# Patient Record
Sex: Male | Born: 1940 | Race: White | Hispanic: No | Marital: Married | Smoking: Former smoker
Health system: Southern US, Community
[De-identification: ages and names within clinical notes are randomized; demographics above are authoritative.]

## PROBLEM LIST (undated history)

## (undated) DIAGNOSIS — E785 Hyperlipidemia, unspecified: Secondary | ICD-10-CM

## (undated) HISTORY — DX: Hyperlipidemia, unspecified: E78.5

## (undated) HISTORY — PX: CHOLECYSTECTOMY: SHX55

---

## 2015-09-27 DIAGNOSIS — R030 Elevated blood-pressure reading, without diagnosis of hypertension: Secondary | ICD-10-CM | POA: Diagnosis not present

## 2015-09-27 DIAGNOSIS — R7303 Prediabetes: Secondary | ICD-10-CM | POA: Diagnosis not present

## 2015-09-27 DIAGNOSIS — E559 Vitamin D deficiency, unspecified: Secondary | ICD-10-CM | POA: Diagnosis not present

## 2015-09-27 DIAGNOSIS — R739 Hyperglycemia, unspecified: Secondary | ICD-10-CM | POA: Diagnosis not present

## 2015-09-27 DIAGNOSIS — E785 Hyperlipidemia, unspecified: Secondary | ICD-10-CM | POA: Diagnosis not present

## 2015-09-27 DIAGNOSIS — N400 Enlarged prostate without lower urinary tract symptoms: Secondary | ICD-10-CM | POA: Diagnosis not present

## 2015-09-27 DIAGNOSIS — M1991 Primary osteoarthritis, unspecified site: Secondary | ICD-10-CM | POA: Diagnosis not present

## 2015-12-26 DIAGNOSIS — E785 Hyperlipidemia, unspecified: Secondary | ICD-10-CM | POA: Diagnosis not present

## 2015-12-26 DIAGNOSIS — Z0000 Encounter for general adult medical examination without abnormal findings: Secondary | ICD-10-CM | POA: Diagnosis not present

## 2015-12-26 DIAGNOSIS — M1991 Primary osteoarthritis, unspecified site: Secondary | ICD-10-CM | POA: Diagnosis not present

## 2015-12-26 DIAGNOSIS — N400 Enlarged prostate without lower urinary tract symptoms: Secondary | ICD-10-CM | POA: Diagnosis not present

## 2015-12-26 DIAGNOSIS — E559 Vitamin D deficiency, unspecified: Secondary | ICD-10-CM | POA: Diagnosis not present

## 2015-12-26 DIAGNOSIS — R7303 Prediabetes: Secondary | ICD-10-CM | POA: Diagnosis not present

## 2016-06-29 ENCOUNTER — Encounter: Payer: Self-pay | Admitting: Internal Medicine

## 2016-06-29 DIAGNOSIS — M1991 Primary osteoarthritis, unspecified site: Secondary | ICD-10-CM | POA: Insufficient documentation

## 2016-06-29 DIAGNOSIS — E785 Hyperlipidemia, unspecified: Secondary | ICD-10-CM | POA: Insufficient documentation

## 2016-06-29 DIAGNOSIS — E559 Vitamin D deficiency, unspecified: Secondary | ICD-10-CM | POA: Insufficient documentation

## 2016-06-29 DIAGNOSIS — N400 Enlarged prostate without lower urinary tract symptoms: Secondary | ICD-10-CM | POA: Insufficient documentation

## 2016-06-29 DIAGNOSIS — R7303 Prediabetes: Secondary | ICD-10-CM | POA: Insufficient documentation

## 2016-07-02 ENCOUNTER — Telehealth: Payer: Self-pay | Admitting: Internal Medicine

## 2016-07-02 NOTE — Telephone Encounter (Signed)
Rec'd from [place] forward 68 pages to Dr.Burns

## 2016-07-03 ENCOUNTER — Ambulatory Visit (INDEPENDENT_AMBULATORY_CARE_PROVIDER_SITE_OTHER): Payer: Medicare Other | Admitting: Internal Medicine

## 2016-07-03 ENCOUNTER — Encounter: Payer: Self-pay | Admitting: Internal Medicine

## 2016-07-03 VITALS — BP 134/84 | HR 79 | Temp 98.0°F | Resp 16 | Ht 69.0 in | Wt 198.0 lb

## 2016-07-03 DIAGNOSIS — Z974 Presence of external hearing-aid: Secondary | ICD-10-CM

## 2016-07-03 DIAGNOSIS — E7800 Pure hypercholesterolemia, unspecified: Secondary | ICD-10-CM

## 2016-07-03 DIAGNOSIS — M1991 Primary osteoarthritis, unspecified site: Secondary | ICD-10-CM

## 2016-07-03 DIAGNOSIS — Z23 Encounter for immunization: Secondary | ICD-10-CM

## 2016-07-03 DIAGNOSIS — E785 Hyperlipidemia, unspecified: Secondary | ICD-10-CM | POA: Diagnosis not present

## 2016-07-03 DIAGNOSIS — Z87442 Personal history of urinary calculi: Secondary | ICD-10-CM

## 2016-07-03 DIAGNOSIS — N400 Enlarged prostate without lower urinary tract symptoms: Secondary | ICD-10-CM | POA: Diagnosis not present

## 2016-07-03 DIAGNOSIS — E559 Vitamin D deficiency, unspecified: Secondary | ICD-10-CM

## 2016-07-03 MED ORDER — TAMSULOSIN HCL 0.4 MG PO CAPS: 0.4000 mg | ORAL_CAPSULE | Freq: Every day | ORAL | 3 refills | Status: DC

## 2016-07-03 MED ORDER — FINASTERIDE 5 MG PO TABS: 5.0000 mg | ORAL_TABLET | Freq: Every day | ORAL | 3 refills | Status: DC

## 2016-07-03 NOTE — Progress Notes (Signed)
Patient received education resource, including the self-management goal and tool. Patient verbalized understanding. 

## 2016-07-03 NOTE — Progress Notes (Signed)
;   Subjective:    Patient ID: Joe Blalock, MD, male    DOB: Feb 04, 1941, 75 y.o.   MRN: [id number]  HPI He is here to establish with a new pcp.      Hyperlipidemia: He is trying to control his cholesterol with lifestyle.  He did not tolerate simvatsatin due to severe muscle aches in his legs.  He is compliant with a low fat/cholesterol diet. He is very active, but does not exercise regularly. He denies myalgias.   Osteoarthritis: He has some arthritis - mild in nature.  He takes ibuprofen as needed, which works well for him.  He is very active around his home, but does not do formal exercise.   BPH:  He is taking flomax and proscar daily.  He denies difficulty urinating or blood in the stool.  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    Vitamin D def:  He is taking vitamin D daily.    Prior records show he was a prediabetic.  His a1c has been 5.7%.  He denies being told he had prediabetes and does not feel he does.    Medications and allergies reviewed with patient and updated if appropriate.  Patient Active Problem List   Diagnosis Date Noted  . Hyperlipidemia 06/29/2016  . Prediabetes 06/29/2016  . 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 Vitamin D deficiency 06/29/2016  . BPH (benign prostatic hyperplasia) 06/29/2016    No current outpatient prescriptions on file prior to visit.   No current facility-administered medications on file prior to visit.     History reviewed. No pertinent past medical history.  Past Surgical History:  Procedure Laterality Date  . CHOLECYSTECTOMY      Social History   Social History  . Marital status: Married    Spouse name: N/A  . Number of children: N/A  . Years of education: N/A   Social History Main Topics  . Smoking status: Former Research scientist (life sciences)  . Smokeless tobacco: Never Used  . Alcohol use No  . Drug use: No  . Sexual activity: Not Asked   Other Topics Concern  . None   Social History Narrative  . None    Family History    Problem Relation Age of Onset  . Arthritis Mother   . Heart disease Father   . Arthritis Father   . Diabetes Maternal Aunt   . Cancer Paternal Uncle     lung  . Stroke Maternal Grandmother   . Diabetes Maternal Grandmother     Review of Systems  Constitutional: Negative for chills and unexpected weight change.  Respiratory: Negative for cough, shortness of breath and wheezing.   Cardiovascular: Negative for chest pain and leg swelling.  Genitourinary: Negative for dysuria and hematuria.  Musculoskeletal: Positive for arthralgias.  Neurological: Negative for light-headedness and headaches.       Objective:   Vitals:   07/03/16 0953  BP: 134/84  Pulse: 79  Resp: 16  Temp: 98 F (36.7 C)   Filed Weights   07/03/16 0953  Weight: 198 lb (89.8 kg)   Body mass index is 29.24 kg/m.   Physical Exam Constitutional: He appears well-developed and well-nourished. No distress.  HENT:  Head: Normocephalic and atraumatic.  Right Ear: External ear normal.  Left Ear: External ear normal.  Mouth/Throat: Oropharynx is clear and moist.  Normal ear canals and TM b/l  Eyes: Conjunctivae  normal.  Neck: Neck supple. No tracheal deviation present. No thyromegaly present.  No carotid bruit  Cardiovascular: Normal rate, regular rhythm, normal heart sounds and intact distal pulses.   No murmur heard. Pulmonary/Chest: Effort normal and breath sounds normal. No respiratory distress. He has no wheezes. He has no rales.  Abdominal: Soft. Bowel sounds are normal. He exhibits no distension. There is no tenderness.  Musculoskeletal, vascular: He exhibits no edema.  Lymphadenopathy:   He has no cervical adenopathy.  Skin: Skin is warm and dry. He is not diaphoretic.  Psychiatric: He has a normal mood and affect. His behavior is normal.         Assessment & Plan:   See Problem List for Assessment and Plan of chronic medical problems.

## 2016-07-03 NOTE — Patient Instructions (Addendum)
    All other Health Maintenance issues reviewed.   All recommended immunizations and age-appropriate screenings are up-to-date or discussed.  Pneumonia immunization administered today.   Medications reviewed and updated.  No changes recommended at this time.  Your prescription(s) have been submitted to your pharmacy. Please take as directed and contact our office if you believe you are having problem(s) with the medication(s).   Please followup in 6 months for a physical

## 2016-07-04 ENCOUNTER — Encounter: Payer: Self-pay | Admitting: Internal Medicine

## 2016-07-04 DIAGNOSIS — Z974 Presence of external hearing-aid: Secondary | ICD-10-CM | POA: Insufficient documentation

## 2016-07-04 MED ORDER — IBUPROFEN 200 MG PO TABS: 200.0000 mg | ORAL_TABLET | Freq: Four times a day (QID) | ORAL | 0 refills | Status: DC | PRN

## 2016-07-04 NOTE — Assessment & Plan Note (Signed)
No current symptoms

## 2016-07-04 NOTE — Assessment & Plan Note (Signed)
Well controlled with current medication Continue proscar and flomax

## 2016-07-04 NOTE — Assessment & Plan Note (Signed)
He takes ibuprofen on occasion for pain

## 2016-07-04 NOTE — Assessment & Plan Note (Signed)
Taking vitamin d daily 

## 2016-07-04 NOTE — Assessment & Plan Note (Signed)
Was on a statin - did not tolerate it due to muscle aches Would like to avoid medication if possible Check labs in 6 months

## 2016-07-08 ENCOUNTER — Ambulatory Visit: Payer: Medicare Other | Admitting: Internal Medicine

## 2016-07-09 ENCOUNTER — Encounter: Payer: Self-pay | Admitting: Internal Medicine

## 2016-07-09 ENCOUNTER — Ambulatory Visit (INDEPENDENT_AMBULATORY_CARE_PROVIDER_SITE_OTHER): Payer: Medicare Other | Admitting: Internal Medicine

## 2016-07-09 DIAGNOSIS — J40 Bronchitis, not specified as acute or chronic: Secondary | ICD-10-CM

## 2016-07-09 MED ORDER — DOXYCYCLINE HYCLATE 100 MG PO TABS: 100.0000 mg | ORAL_TABLET | Freq: Two times a day (BID) | ORAL | 0 refills | Status: DC

## 2016-07-09 MED ORDER — HYDROCODONE-HOMATROPINE 5-1.5 MG/5ML PO SYRP: 5.0000 mL | ORAL_SOLUTION | Freq: Three times a day (TID) | ORAL | 0 refills | Status: DC | PRN

## 2016-07-09 NOTE — Patient Instructions (Signed)
We have sent in doxycycline for the bronchitis. Take 1 pill twice a day for 1 week.   We have given you the cough syrup prescription as well to use for cough.

## 2016-07-09 NOTE — Assessment & Plan Note (Signed)
Rx for doxycycline and hycodan cough syrup for the symptoms which are worsening.

## 2016-07-09 NOTE — Progress Notes (Signed)
   Subjective:    Patient ID: Joe Blalock, MD, male    DOB: 1941/02/10, 75 y.o.   MRN: [id number]  HPI The patient is a 75 YO man coming in for cough and congestion symptoms going on for about 1 week. Some coughing and some SOB. He is getting worse over the last week. Tried otc medications without relief. No fevers but mild chills. Cough is keeping him up at night time. Nose with drainage, no ear pain or drainage.   Review of Systems  Constitutional: Positive for activity change and chills. Negative for appetite change, fatigue, fever and unexpected weight change.  HENT: Positive for congestion, rhinorrhea and sore throat. Negative for ear discharge, ear pain, postnasal drip, sinus pain, sinus pressure and trouble swallowing.   Eyes: Negative.   Respiratory: Positive for cough and shortness of breath. Negative for chest tightness and wheezing.   Cardiovascular: Negative for chest pain, palpitations and leg swelling.  Gastrointestinal: Negative.   Musculoskeletal: Negative.   Neurological: Negative.       Objective:   Physical Exam  Constitutional: He is oriented to person, place, and time. He appears well-developed and well-nourished.  HENT:  Head: Normocephalic and atraumatic.  Oropharynx with redness and clear drainage, nose with crusting.   Eyes: EOM are normal.  Neck: Normal range of motion.  Cardiovascular: Normal rate and regular rhythm.   Pulmonary/Chest: Effort normal. No respiratory distress. He has wheezes. He has no rales. He exhibits no tenderness.  Some wheezing and rhonchi in the bases bilaterally which does not clear with cough  Abdominal: Soft. He exhibits no distension. There is no tenderness. There is no rebound.  Lymphadenopathy:    He has no cervical adenopathy.  Neurological: He is alert and oriented to person, place, and time. Coordination normal.  Skin: Skin is warm and dry.   Vitals:   07/09/16 0811  BP: (!) 158/88  Pulse: 95  Resp: 18  Temp: 98.1 F  (36.7 C)  TempSrc: Oral  SpO2: 95%  Weight: 198 lb 1.9 oz (89.9 kg)  Height: 5\' 10"  (1.778 m)      Assessment & Plan:

## 2016-07-09 NOTE — Progress Notes (Signed)
Pre visit review using our clinic review tool, if applicable. No additional management support is needed unless otherwise documented below in the visit note. 

## 2016-07-14 ENCOUNTER — Other Ambulatory Visit: Payer: Self-pay | Admitting: Internal Medicine

## 2016-12-19 ENCOUNTER — Telehealth: Payer: Self-pay | Admitting: Internal Medicine

## 2016-12-19 ENCOUNTER — Ambulatory Visit (INDEPENDENT_AMBULATORY_CARE_PROVIDER_SITE_OTHER): Payer: Medicare Other | Admitting: Internal Medicine

## 2016-12-19 ENCOUNTER — Encounter: Payer: Self-pay | Admitting: Internal Medicine

## 2016-12-19 ENCOUNTER — Other Ambulatory Visit (INDEPENDENT_AMBULATORY_CARE_PROVIDER_SITE_OTHER): Payer: Medicare Other

## 2016-12-19 VITALS — BP 132/78 | HR 72 | Temp 97.7°F | Resp 16 | Wt 202.0 lb

## 2016-12-19 DIAGNOSIS — E559 Vitamin D deficiency, unspecified: Secondary | ICD-10-CM | POA: Diagnosis not present

## 2016-12-19 DIAGNOSIS — Z0000 Encounter for general adult medical examination without abnormal findings: Secondary | ICD-10-CM | POA: Diagnosis not present

## 2016-12-19 DIAGNOSIS — E7800 Pure hypercholesterolemia, unspecified: Secondary | ICD-10-CM

## 2016-12-19 DIAGNOSIS — K4090 Unilateral inguinal hernia, without obstruction or gangrene, not specified as recurrent: Secondary | ICD-10-CM | POA: Diagnosis not present

## 2016-12-19 LAB — CBC WITH DIFFERENTIAL/PLATELET
Basophils Absolute: 0 10*3/uL (ref 0.0–0.1)
Basophils Relative: 0.1 % (ref 0.0–3.0)
Eosinophils Absolute: 0.3 10*3/uL (ref 0.0–0.7)
Eosinophils Relative: 3.1 % (ref 0.0–5.0)
HCT: 48.9 % (ref 39.0–52.0)
Hemoglobin: 16.2 g/dL (ref 13.0–17.0)
Lymphocytes Relative: 43.2 % (ref 12.0–46.0)
Lymphs Abs: 4 10*3/uL (ref 0.7–4.0)
MCHC: 33.1 g/dL (ref 30.0–36.0)
MCV: 92.5 fl (ref 78.0–100.0)
Monocytes Absolute: 1 10*3/uL (ref 0.1–1.0)
Monocytes Relative: 10.3 % (ref 3.0–12.0)
Neutro Abs: 4 10*3/uL (ref 1.4–7.7)
Neutrophils Relative %: 43.3 % (ref 43.0–77.0)
Platelets: 209 10*3/uL (ref 150.0–400.0)
RBC: 5.29 Mil/uL (ref 4.22–5.81)
RDW: 14.2 % (ref 11.5–15.5)
WBC: 9.3 10*3/uL (ref 4.0–10.5)

## 2016-12-19 LAB — COMPREHENSIVE METABOLIC PANEL
ALT: 14 U/L (ref 0–53)
AST: 15 U/L (ref 0–37)
Albumin: 4.4 g/dL (ref 3.5–5.2)
Alkaline Phosphatase: 53 U/L (ref 39–117)
BUN: 15 mg/dL (ref 6–23)
CO2: 29 mEq/L (ref 19–32)
Calcium: 9.7 mg/dL (ref 8.4–10.5)
Chloride: 105 mEq/L (ref 96–112)
Creatinine, Ser: 1.12 mg/dL (ref 0.40–1.50)
GFR: 68.01 mL/min (ref >60.00)
Glucose, Bld: 93 mg/dL (ref 70–99)
Potassium: 4.7 mEq/L (ref 3.5–5.1)
Sodium: 141 mEq/L (ref 135–145)
Total Bilirubin: 0.8 mg/dL (ref 0.2–1.2)
Total Protein: 7.7 g/dL (ref 6.0–8.3)

## 2016-12-19 LAB — LDL CHOLESTEROL, DIRECT: LDL DIRECT: 167 mg/dL

## 2016-12-19 LAB — VITAMIN D 25 HYDROXY (VIT D DEFICIENCY, FRACTURES): VITD: 44.13 ng/mL (ref 30.00–100.00)

## 2016-12-19 LAB — LIPID PANEL
CHOL/HDL RATIO: 6
CHOLESTEROL: 245 mg/dL — AB (ref 0–200)
HDL: 38.2 mg/dL — AB (ref >39.00)
NONHDL: 207.09
TRIGLYCERIDES: 212 mg/dL — AB (ref 0.0–149.0)
VLDL: 42.4 mg/dL — AB (ref 0.0–40.0)

## 2016-12-19 LAB — HEMOGLOBIN A1C: Hgb A1c MFr Bld: 5.5 % (ref 4.6–6.5)

## 2016-12-19 LAB — TSH: TSH: 2.83 u[IU]/mL (ref 0.35–4.50)

## 2016-12-19 NOTE — Progress Notes (Signed)
Subjective:    Patient ID: Joe Blalock, MD, male    DOB: 1941-08-13, 76 y.o.   MRN: [id number]  HPI  He is here for an acute visit.   He believes he has an inguinal hernia.  After coughing excessive last fall with bronchitis he believes he has developed a right inguinal hernia.  He has pain and a lump in the right groin region.  He did get himself a belt and that has helped.  He has persistent pain and the lump has been persistent.  He believes he needs surgery and would like a referral.    He has had only 2 episodes of severe pain, the rest of the time his pain has been less severe.  When he stands it feels like the intestines will fall out of his abdomen.  He denies fever/chill or signs of incarceration.      Medications and allergies reviewed with patient and updated if appropriate.  Patient Active Problem List   Diagnosis Date Noted  . Bronchitis 07/09/2016  . Wears hearing aid 07/04/2016  . 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 Hyperlipidemia 06/29/2016  . 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 Vitamin D deficiency 06/29/2016  . BPH (benign prostatic hyperplasia) 06/29/2016    Current Outpatient Prescriptions on File Prior to Visit  Medication Sig Dispense Refill  . finasteride (PROSCAR) 5 MG tablet Take 1 tablet (5 mg total) by mouth daily. 90 tablet 3  . ibuprofen (ADVIL,MOTRIN) 200 MG tablet Take 1 tablet (200 mg total) by mouth every 6 (six) hours as needed. 30 tablet 0  . tamsulosin (FLOMAX) 0.4 MG CAPS capsule Take 1 capsule (0.4 mg total) by mouth daily. 90 capsule 3   No current facility-administered medications on file prior to visit.     No past medical history on file.  Past Surgical History:  Procedure Laterality Date  . CHOLECYSTECTOMY      Social History   Social History  . Marital status: Married    Spouse name: N/A  . Number of children: N/A  . Years of education: N/A   Social History Main Topics  . Smoking status: Former Research scientist (life sciences)  .  Smokeless tobacco: Never Used  . Alcohol use No  . Drug use: No  . Sexual activity: Not on file   Other Topics Concern  . Not on file   Social History Narrative  . No narrative on file    Family History  Problem Relation Age of Onset  . Arthritis Mother   . Heart disease Father   . Arthritis Father   . Diabetes Maternal Aunt   . Cancer Paternal Uncle     lung  . Stroke Maternal Grandmother   . Diabetes Maternal Grandmother     Review of Systems  Constitutional: Negative for chills and fever.  Gastrointestinal: Negative for abdominal pain and constipation.  Skin: Negative for color change.       Objective:   Vitals:   12/19/16 0849  BP: 132/78  Pulse: 72  Resp: 16  Temp: 97.7 F (36.5 C)   Filed Weights   12/19/16 0849  Weight: 202 lb (91.6 kg)   Body mass index is 28.98 kg/m.  Wt Readings from Last 3 Encounters:  12/19/16 202 lb (91.6 kg)  07/09/16 198 lb 1.9 oz (89.9 kg)  07/03/16 198 lb (89.8 kg)     Physical Exam  Constitutional: He appears well-developed and well-nourished. No distress.  Abdominal: Soft. He exhibits mass (visible and palpable lump  in right groin/pubic region that is mildly tender). He exhibits no distension. There is no tenderness. There is no rebound and no guarding.  Musculoskeletal: He exhibits no edema.  Skin: Skin is warm and dry. He is not diaphoretic. No erythema.         Assessment & Plan:   See Problem List for Assessment and Plan of chronic medical problems.   He has  CPE appt for next month - will get blood work done today - ordered.

## 2016-12-19 NOTE — Progress Notes (Signed)
Pre visit review using our clinic review tool, if applicable. No additional management support is needed unless otherwise documented below in the visit note. 

## 2016-12-19 NOTE — Telephone Encounter (Signed)
Informed pts wife that this information will be given to them when the referral is completed.

## 2016-12-19 NOTE — Telephone Encounter (Signed)
Pt would like to know where the referral was put into. Where will he be geetting the surgery?

## 2016-12-19 NOTE — Patient Instructions (Signed)
Have blood work done today.  We will review at your physical.  A referral for surgery was ordered.

## 2017-01-01 ENCOUNTER — Ambulatory Visit: Payer: Self-pay | Admitting: General Surgery

## 2017-01-01 DIAGNOSIS — K4090 Unilateral inguinal hernia, without obstruction or gangrene, not specified as recurrent: Secondary | ICD-10-CM | POA: Diagnosis not present

## 2017-01-01 NOTE — H&P (Signed)
History of Present Illness Ralene Ok MD; 01/01/2017 3:02 PM) The patient is a 76 year old male who presents with an inguinal hernia. The patient is a 76 year old male is referred by Dr. Billey Gosling for evaluation of right inguinal hernia. Patient states she's had this there for several weeks to months. He states it is bothersome. He is active. He is currently retired. Patient states she's had no signs or symptoms of incarceration or strangulation. All other reviews systems are negative.   Allergies (Yasmin Izola Price, CMA; 01/01/2017 2:48 PM) Simvastatin *ANTIHYPERLIPIDEMICS*  Muscle aches and joint pain  Medication History (Yasmin Roberson, CMA; 01/01/2017 2:49 PM) Tamsulosin HCl (0.4MG  Capsule, Oral) Active. Finasteride (5MG  Tablet, Oral) Active. Ibuprofen (400MG  Tablet, Oral) Active. Medications Reconciled    Review of Systems Ralene Ok, MD; 01/01/2017 3:3 PM) General Not Present- Fever. Respiratory Not Present- Cough and Difficulty Breathing. Cardiovascular Not Present- Chest Pain. Gastrointestinal Present- Abdominal Pain. Musculoskeletal Not Present- Myalgia. Neurological Not Present- Weakness.  Vitals (Yasmin Roberson CMA; 01/01/2017 2:48 PM) 01/01/2017 2:47 PM Weight: 201.8 lb Height: 70.5in Body Surface Area: 2.11 m Body Mass Index: 28.55 kg/m  Temp.: 98.74F  Pulse: 96 (Regular)  BP: 142/78 (Sitting, Left Arm, Standard)       Physical Exam Ralene Ok, MD; 01/01/2017 3:3 PM) General Mental Status-Alert. General Appearance-Consistent with stated age. Hydration-Well hydrated. Voice-Normal.  Head and Neck Head-normocephalic, atraumatic with no lesions or palpable masses. Trachea-midline.  Eye Eyeball - Bilateral-Extraocular movements intact. Sclera/Conjunctiva - Bilateral-No scleral icterus.  Chest and Lung Exam Chest and lung exam reveals -quiet, even and easy respiratory effort with no use of accessory  muscles. Inspection Chest Wall - Normal. Back - normal.  Cardiovascular Cardiovascular examination reveals -normal heart sounds, regular rate and rhythm with no murmurs.  Abdomen Inspection Skin - Scar - no surgical scars. Hernias - Inguinal hernia - Right - Reducible. Palpation/Percussion Normal exam - Soft, Non Tender, No Rebound tenderness, No Rigidity (guarding) and No hepatosplenomegaly. Auscultation Normal exam - Bowel sounds normal.  Neurologic Neurologic evaluation reveals -alert and oriented x 3 with no impairment of recent or remote memory. Mental Status-Normal.  Musculoskeletal Normal Exam - Left-Upper Extremity Strength Normal and Lower Extremity Strength Normal. Normal Exam - Right-Upper Extremity Strength Normal, Lower Extremity Weakness.    Assessment & Plan Ralene Ok MD; 01/01/2017 3:03 PM) RIGHT INGUINAL HERNIA (K40.90) Impression: 76 year old male with a right inguinal hernia. 1. The patient will like to proceed to the operating room for laparoscopic right inguinal hernia repair with mesh.  2. I discussed with the patient the signs and symptoms of incarceration and strangulation and the need to proceed to the ER should they occur.  3. I discussed with the patient the risks and benefits of the procedure to include but not limited to: Infection, bleeding, damage to surrounding structures, possible need for further surgery, possible nerve pain, and possible recurrence. The patient was understanding and wishes to proceed.

## 2017-01-14 ENCOUNTER — Encounter (HOSPITAL_COMMUNITY)
Admission: RE | Admit: 2017-01-14 | Discharge: 2017-01-14 | Disposition: A | Discharge status: Home / Self Care | Payer: Medicare Other | Source: Ambulatory Visit | Attending: General Surgery | Admitting: General Surgery

## 2017-01-14 DIAGNOSIS — K4090 Unilateral inguinal hernia, without obstruction or gangrene, not specified as recurrent: Secondary | ICD-10-CM | POA: Insufficient documentation

## 2017-01-14 DIAGNOSIS — Z01812 Encounter for preprocedural laboratory examination: Secondary | ICD-10-CM | POA: Insufficient documentation

## 2017-01-14 LAB — BASIC METABOLIC PANEL
Anion gap: 8 (ref 5–15)
BUN: 13 mg/dL (ref 6–20)
CALCIUM: 9.1 mg/dL (ref 8.9–10.3)
CHLORIDE: 105 mmol/L (ref 101–111)
CO2: 23 mmol/L (ref 22–32)
CREATININE: 1.12 mg/dL (ref 0.61–1.24)
GFR calc non Af Amer: >60 mL/min (ref >60)
Glucose, Bld: 95 mg/dL (ref 65–99)
Potassium: 4.5 mmol/L (ref 3.5–5.1)
SODIUM: 136 mmol/L (ref 135–145)

## 2017-01-14 LAB — CBC
HCT: 47.8 % (ref 39.0–52.0)
Hemoglobin: 15.5 g/dL (ref 13.0–17.0)
MCH: 30.4 pg (ref 26.0–34.0)
MCHC: 32.4 g/dL (ref 30.0–36.0)
MCV: 93.7 fL (ref 78.0–100.0)
PLATELETS: 194 10*3/uL (ref 150–400)
RBC: 5.1 MIL/uL (ref 4.22–5.81)
RDW: 13.6 % (ref 11.5–15.5)
WBC: 8.6 10*3/uL (ref 4.0–10.5)

## 2017-01-14 NOTE — Pre-Procedure Instructions (Signed)
    Joe Blalock, MD  01/14/2017      [facility] [phone] - [address] [place] [address] Phone: [phone] Fax: [phone]    Your procedure is scheduled on June 1.  Report to [facility] Admitting at 12:30 P.M.  Call this number if you have problems the morning of surgery:  [phone]   Remember:  Do not eat food or drink liquids after midnight.  Take these medicines the morning of surgery with A SIP OF WATER : tamsulosin (FLOMAX)   STOP aspirin, ibuprofen(advil), fish oil, vitamins, herbal medications   Do not wear jewelry.  Do not wear lotions, powders, or colognes, or deoderant.  Men may shave face and neck only.  Do not bring valuables to the hospital.  [facility] is not responsible for any belongings or valuables.  Contacts, dentures or bridgework may not be worn into surgery.  Leave your suitcase in the car.  After surgery it may be brought to your room.  For patients admitted to the hospital, discharge time will be determined by your treatment team.  Patients discharged the day of surgery will not be allowed to drive home.   Name and phone number of your driver:    Special instructions:  Preparing for surgery  Please read over the following fact sheets that you were given. Pain Booklet and Surgical Site Infection Prevention

## 2017-01-23 ENCOUNTER — Encounter (HOSPITAL_COMMUNITY): Payer: Self-pay | Admitting: Anesthesiology

## 2017-01-23 NOTE — Anesthesia Preprocedure Evaluation (Addendum)
Anesthesia Evaluation  Patient identified by MRN, date of birth, ID band Patient awake    Reviewed: Allergy & Precautions, NPO status , Patient's Chart, lab work & pertinent test results  Airway Mallampati: II       Dental no notable dental hx. (+) Teeth Intact   Pulmonary former smoker,    Pulmonary exam normal breath sounds clear to auscultation       Cardiovascular negative cardio ROS Normal cardiovascular exam Rhythm:Regular Rate:Normal     Neuro/Psych negative neurological ROS  negative psych ROS   GI/Hepatic negative GI ROS, Neg liver ROS,   Endo/Other  Hyperlipidemia   Renal/GU Hx/o nephrolithiasis   BPH     Musculoskeletal  (+) Arthritis , Osteoarthritis,  Right inguinal hernia    Abdominal   Peds  Hematology negative hematology ROS (+)   Anesthesia Other Findings   Reproductive/Obstetrics                           BP Readings from Last 3 Encounters:  01/14/17 (!) 152/78  12/19/16 132/78  07/09/16 132/82   Lab Results  Component Value Date   WBC 8.6 01/14/2017   HGB 15.5 01/14/2017   HCT 47.8 01/14/2017   MCV 93.7 01/14/2017   PLT 194 01/14/2017     Chemistry      Component Value Date/Time   NA 136 01/14/2017 1103   K 4.5 01/14/2017 1103   CL 105 01/14/2017 1103   CO2 23 01/14/2017 1103   BUN 13 01/14/2017 1103   CREATININE 1.12 01/14/2017 1103      Component Value Date/Time   CALCIUM 9.1 01/14/2017 1103   ALKPHOS 53 12/19/2016 0938   AST 15 12/19/2016 0938   ALT 14 12/19/2016 0938   BILITOT 0.8 12/19/2016 0938      Anesthesia Physical Anesthesia Plan  ASA: II  Anesthesia Plan: General   Post-op Pain Management:    Induction: Intravenous  PONV Risk Score and Plan: 4 or greater and Ondansetron, Dexamethasone, Propofol, Treatment may vary due to age and Promethazine  Airway Management Planned:   Additional Equipment:   Intra-op Plan:    Post-operative Plan: Extubation in OR  Informed Consent: I have reviewed the patients History and Physical, chart, labs and discussed the procedure including the risks, benefits and alternatives for the proposed anesthesia with the patient or authorized representative who has indicated his/her understanding and acceptance.   Dental advisory given  Plan Discussed with: CRNA, Anesthesiologist and Surgeon  Anesthesia Plan Comments:        Anesthesia Quick Evaluation

## 2017-01-24 ENCOUNTER — Encounter (HOSPITAL_COMMUNITY)
Admission: RE | Disposition: A | Discharge status: Home / Self Care | Payer: Self-pay | Source: Ambulatory Visit | Attending: General Surgery

## 2017-01-24 ENCOUNTER — Ambulatory Visit (HOSPITAL_COMMUNITY)
Admission: RE | Admit: 2017-01-24 | Discharge: 2017-01-24 | Disposition: A | Discharge status: Home / Self Care | Payer: Medicare Other | Source: Ambulatory Visit | Attending: General Surgery | Admitting: General Surgery

## 2017-01-24 ENCOUNTER — Ambulatory Visit (HOSPITAL_COMMUNITY): Payer: Medicare Other | Admitting: Certified Registered Nurse Anesthetist

## 2017-01-24 ENCOUNTER — Encounter (HOSPITAL_COMMUNITY): Payer: Self-pay | Admitting: *Deleted

## 2017-01-24 DIAGNOSIS — Z79899 Other long term (current) drug therapy: Secondary | ICD-10-CM | POA: Diagnosis not present

## 2017-01-24 DIAGNOSIS — Z87442 Personal history of urinary calculi: Secondary | ICD-10-CM | POA: Insufficient documentation

## 2017-01-24 DIAGNOSIS — Z87891 Personal history of nicotine dependence: Secondary | ICD-10-CM | POA: Diagnosis not present

## 2017-01-24 DIAGNOSIS — K4090 Unilateral inguinal hernia, without obstruction or gangrene, not specified as recurrent: Secondary | ICD-10-CM | POA: Insufficient documentation

## 2017-01-24 DIAGNOSIS — M1990 Unspecified osteoarthritis, unspecified site: Secondary | ICD-10-CM | POA: Diagnosis not present

## 2017-01-24 DIAGNOSIS — Z888 Allergy status to other drugs, medicaments and biological substances status: Secondary | ICD-10-CM | POA: Insufficient documentation

## 2017-01-24 DIAGNOSIS — N400 Enlarged prostate without lower urinary tract symptoms: Secondary | ICD-10-CM | POA: Insufficient documentation

## 2017-01-24 DIAGNOSIS — J40 Bronchitis, not specified as acute or chronic: Secondary | ICD-10-CM | POA: Diagnosis not present

## 2017-01-24 DIAGNOSIS — E785 Hyperlipidemia, unspecified: Secondary | ICD-10-CM | POA: Insufficient documentation

## 2017-01-24 DIAGNOSIS — Z7982 Long term (current) use of aspirin: Secondary | ICD-10-CM | POA: Diagnosis not present

## 2017-01-24 HISTORY — PX: INSERTION OF MESH: SHX5868

## 2017-01-24 HISTORY — PX: INGUINAL HERNIA REPAIR: SHX194

## 2017-01-24 SURGERY — REPAIR, HERNIA, INGUINAL, LAPAROSCOPIC
Anesthesia: General | Site: Groin | Laterality: Right

## 2017-01-24 MED ORDER — BUPIVACAINE HCL (PF) 0.25 % IJ SOLN
INTRAMUSCULAR | Status: AC
  Filled 2017-01-24: qty 30

## 2017-01-24 MED ORDER — PROMETHAZINE HCL 25 MG/ML IJ SOLN: 6.2500 mg | INTRAMUSCULAR | Status: DC | PRN

## 2017-01-24 MED ORDER — MIDAZOLAM HCL 2 MG/2ML IJ SOLN
INTRAMUSCULAR | Status: DC | PRN
  Administered 2017-01-24: 2 mg via INTRAVENOUS

## 2017-01-24 MED ORDER — BUPIVACAINE HCL 0.25 % IJ SOLN
INTRAMUSCULAR | Status: DC | PRN
  Administered 2017-01-24: 7 mL

## 2017-01-24 MED ORDER — OXYCODONE-ACETAMINOPHEN 5-325 MG PO TABS: 1.0000 | ORAL_TABLET | Freq: Four times a day (QID) | ORAL | 0 refills | Status: DC | PRN

## 2017-01-24 MED ORDER — ACETAMINOPHEN 650 MG RE SUPP
650.0000 mg | RECTAL | Status: DC | PRN
Start: 2017-01-24 — End: 2017-01-24

## 2017-01-24 MED ORDER — PHENYLEPHRINE 40 MCG/ML (10ML) SYRINGE FOR IV PUSH (FOR BLOOD PRESSURE SUPPORT)
PREFILLED_SYRINGE | INTRAVENOUS | Status: DC | PRN
  Administered 2017-01-24: 80 ug via INTRAVENOUS
  Administered 2017-01-24: 160 ug via INTRAVENOUS

## 2017-01-24 MED ORDER — FENTANYL CITRATE (PF) 250 MCG/5ML IJ SOLN
INTRAMUSCULAR | Status: AC
  Filled 2017-01-24: qty 5

## 2017-01-24 MED ORDER — DEXAMETHASONE SODIUM PHOSPHATE 10 MG/ML IJ SOLN
INTRAMUSCULAR | Status: DC | PRN
  Administered 2017-01-24: 10 mg via INTRAVENOUS

## 2017-01-24 MED ORDER — MORPHINE SULFATE (PF) 2 MG/ML IV SOLN: 2.0000 mg | INTRAVENOUS | Status: DC | PRN

## 2017-01-24 MED ORDER — PROPOFOL 10 MG/ML IV BOLUS
INTRAVENOUS | Status: DC | PRN
  Administered 2017-01-24: 140 mg via INTRAVENOUS

## 2017-01-24 MED ORDER — ROCURONIUM BROMIDE 10 MG/ML (PF) SYRINGE
PREFILLED_SYRINGE | INTRAVENOUS | Status: DC | PRN
  Administered 2017-01-24: 40 mg via INTRAVENOUS
  Administered 2017-01-24: 10 mg via INTRAVENOUS

## 2017-01-24 MED ORDER — FENTANYL CITRATE (PF) 250 MCG/5ML IJ SOLN
INTRAMUSCULAR | Status: DC | PRN
  Administered 2017-01-24: 100 ug via INTRAVENOUS
  Administered 2017-01-24: 50 ug via INTRAVENOUS

## 2017-01-24 MED ORDER — HYDROCODONE-ACETAMINOPHEN 7.5-325 MG PO TABS: 1.0000 | ORAL_TABLET | Freq: Once | ORAL | Status: DC | PRN

## 2017-01-24 MED ORDER — OXYCODONE HCL 5 MG PO TABS: 5.0000 mg | ORAL_TABLET | ORAL | Status: DC | PRN

## 2017-01-24 MED ORDER — CHLORHEXIDINE GLUCONATE CLOTH 2 % EX PADS: 6.0000 | MEDICATED_PAD | Freq: Once | CUTANEOUS | Status: DC

## 2017-01-24 MED ORDER — SUGAMMADEX SODIUM 200 MG/2ML IV SOLN
INTRAVENOUS | Status: DC | PRN
  Administered 2017-01-24: 200 mg via INTRAVENOUS

## 2017-01-24 MED ORDER — CEFAZOLIN SODIUM-DEXTROSE 2-4 GM/100ML-% IV SOLN
INTRAVENOUS | Status: AC
  Filled 2017-01-24: qty 100

## 2017-01-24 MED ORDER — LIDOCAINE 2% (20 MG/ML) 5 ML SYRINGE
INTRAMUSCULAR | Status: DC | PRN
Start: 2017-01-24 — End: 2017-01-24
  Administered 2017-01-24: 80 mg via INTRAVENOUS

## 2017-01-24 MED ORDER — CEFAZOLIN SODIUM-DEXTROSE 2-4 GM/100ML-% IV SOLN
2.0000 g | INTRAVENOUS | Status: AC
  Administered 2017-01-24: 2 g via INTRAVENOUS

## 2017-01-24 MED ORDER — ONDANSETRON HCL 4 MG/2ML IJ SOLN
INTRAMUSCULAR | Status: DC | PRN
  Administered 2017-01-24: 4 mg via INTRAVENOUS

## 2017-01-24 MED ORDER — PROPOFOL 10 MG/ML IV BOLUS
INTRAVENOUS | Status: AC
  Filled 2017-01-24: qty 20

## 2017-01-24 MED ORDER — SUCCINYLCHOLINE CHLORIDE 200 MG/10ML IV SOSY
PREFILLED_SYRINGE | INTRAVENOUS | Status: DC | PRN
  Administered 2017-01-24: 120 mg via INTRAVENOUS

## 2017-01-24 MED ORDER — MIDAZOLAM HCL 2 MG/2ML IJ SOLN
INTRAMUSCULAR | Status: AC
  Filled 2017-01-24: qty 2

## 2017-01-24 MED ORDER — FENTANYL CITRATE (PF) 100 MCG/2ML IJ SOLN: 25.0000 ug | INTRAMUSCULAR | Status: DC | PRN

## 2017-01-24 MED ORDER — LACTATED RINGERS IV SOLN
INTRAVENOUS | Status: DC | PRN
  Administered 2017-01-24 (×2): via INTRAVENOUS

## 2017-01-24 MED ORDER — ACETAMINOPHEN 325 MG PO TABS: 650.0000 mg | ORAL_TABLET | ORAL | Status: DC | PRN

## 2017-01-24 MED ORDER — MEPERIDINE HCL 25 MG/ML IJ SOLN: 6.2500 mg | INTRAMUSCULAR | Status: DC | PRN

## 2017-01-24 MED ORDER — 0.9 % SODIUM CHLORIDE (POUR BTL) OPTIME
TOPICAL | Status: DC | PRN
  Administered 2017-01-24: 1000 mL

## 2017-01-24 SURGICAL SUPPLY — 40 items
APPLIER CLIP 5 13 M/L LIGAMAX5 (MISCELLANEOUS)
BENZOIN TINCTURE PRP APPL 2/3 (GAUZE/BANDAGES/DRESSINGS) ×2 IMPLANT
CANISTER SUCT 3000ML PPV (MISCELLANEOUS) IMPLANT
CHLORAPREP W/TINT 26ML (MISCELLANEOUS) ×2 IMPLANT
CLIP APPLIE 5 13 M/L LIGAMAX5 (MISCELLANEOUS) IMPLANT
COVER SURGICAL LIGHT HANDLE (MISCELLANEOUS) ×2 IMPLANT
DISSECTOR BLUNT TIP ENDO 5MM (MISCELLANEOUS) IMPLANT
ELECT REM PT RETURN 9FT ADLT (ELECTROSURGICAL) ×2
ELECTRODE REM PT RTRN 9FT ADLT (ELECTROSURGICAL) ×1 IMPLANT
GAUZE SPONGE 2X2 8PLY STRL LF (GAUZE/BANDAGES/DRESSINGS) ×1 IMPLANT
GLOVE BIO SURGEON STRL SZ7.5 (GLOVE) ×2 IMPLANT
GOWN STRL REUS W/ TWL LRG LVL3 (GOWN DISPOSABLE) ×2 IMPLANT
GOWN STRL REUS W/ TWL XL LVL3 (GOWN DISPOSABLE) ×1 IMPLANT
GOWN STRL REUS W/TWL LRG LVL3 (GOWN DISPOSABLE) ×2
GOWN STRL REUS W/TWL XL LVL3 (GOWN DISPOSABLE) ×1
KIT BASIN OR (CUSTOM PROCEDURE TRAY) ×2 IMPLANT
KIT ROOM TURNOVER OR (KITS) ×2 IMPLANT
MESH 3DMAX 5X7 RT XLRG (Mesh General) ×2 IMPLANT
NEEDLE INSUFFLATION 14GA 120MM (NEEDLE) ×2 IMPLANT
NS IRRIG 1000ML POUR BTL (IV SOLUTION) ×2 IMPLANT
PAD ARMBOARD 7.5X6 YLW CONV (MISCELLANEOUS) ×4 IMPLANT
RELOAD STAPLE HERNIA 4.0 BLUE (INSTRUMENTS) ×2 IMPLANT
RELOAD STAPLE HERNIA 4.8 BLK (STAPLE) IMPLANT
SCISSORS LAP 5X35 DISP (ENDOMECHANICALS) ×2 IMPLANT
SET IRRIG TUBING LAPAROSCOPIC (IRRIGATION / IRRIGATOR) IMPLANT
SET TROCAR LAP APPLE-HUNT 5MM (ENDOMECHANICALS) ×2 IMPLANT
SPONGE GAUZE 2X2 STER 10/PKG (GAUZE/BANDAGES/DRESSINGS) ×1
STAPLER HERNIA 12 8.5 360D (INSTRUMENTS) ×2 IMPLANT
STRIP CLOSURE SKIN 1/2X4 (GAUZE/BANDAGES/DRESSINGS) ×2 IMPLANT
SUT MNCRL AB 4-0 PS2 18 (SUTURE) ×2 IMPLANT
SUT VIC AB 1 CT1 27 (SUTURE)
SUT VIC AB 1 CT1 27XBRD ANBCTR (SUTURE) IMPLANT
SYRINGE TOOMEY DISP (SYRINGE) ×2 IMPLANT
TOWEL OR 17X24 6PK STRL BLUE (TOWEL DISPOSABLE) ×2 IMPLANT
TOWEL OR 17X26 10 PK STRL BLUE (TOWEL DISPOSABLE) ×2 IMPLANT
TRAY FOLEY CATH SILVER 14FR (SET/KITS/TRAYS/PACK) ×2 IMPLANT
TRAY LAPAROSCOPIC MC (CUSTOM PROCEDURE TRAY) ×2 IMPLANT
TROCAR XCEL 12X100 BLDLESS (ENDOMECHANICALS) ×2 IMPLANT
TUBING INSUFFLATION (TUBING) ×2 IMPLANT
WATER STERILE IRR 1000ML POUR (IV SOLUTION) ×2 IMPLANT

## 2017-01-24 NOTE — Anesthesia Postprocedure Evaluation (Signed)
Anesthesia Post Note  Patient: Joe Blalock, MD  Procedure(s) Performed: Procedure(s) (LRB): LAPAROSCOPIC RIGHT INGUINAL HERNIA WITH MESH (Right) INSERTION OF MESH (Right)     Patient location during evaluation: PACU Anesthesia Type: General Level of consciousness: awake and alert and oriented Pain management: pain level controlled Vital Signs Assessment: post-procedure vital signs reviewed and stable Respiratory status: spontaneous breathing, nonlabored ventilation and respiratory function stable Cardiovascular status: blood pressure returned to baseline and stable Postop Assessment: no signs of nausea or vomiting Anesthetic complications: no    Last Vitals:  Vitals:   01/24/17 0924 01/24/17 0925  BP:    Pulse: 68   Resp: 13   Temp:  36.6 C    Last Pain: There were no vitals filed for this visit.               Bob Eastwood A.

## 2017-01-24 NOTE — Transfer of Care (Signed)
Immediate Anesthesia Transfer of Care Note  Patient: Joe Blalock, MD  Procedure(s) Performed: Procedure(s): LAPAROSCOPIC RIGHT INGUINAL HERNIA WITH MESH (Right) INSERTION OF MESH (Right)  Patient Location: PACU  Anesthesia Type:General  Level of Consciousness: awake, alert , drowsy and patient cooperative  Airway & Oxygen Therapy: Patient Spontanous Breathing and Patient connected to nasal cannula oxygen  Post-op Assessment: Report given to RN, Post -op Vital signs reviewed and stable and Patient moving all extremities X 4  Post vital signs: Reviewed and stable  Last Vitals:  Vitals:   01/24/17 0554  BP: (!) 152/76  Pulse: 73  Resp: 16  Temp: 36.5 C    Last Pain: There were no vitals filed for this visit.    Patients Stated Pain Goal: 5 (91/63/84 6659)  Complications: No apparent anesthesia complications

## 2017-01-24 NOTE — Discharge Instructions (Signed)
[facility] _______[facility] ° °HERNIA REPAIR: POST OP INSTRUCTIONS ° °Always review your discharge instruction sheet given to you by the facility where your surgery was performed. °IF YOU HAVE DISABILITY OR FAMILY LEAVE FORMS, YOU MUST BRING THEM TO THE OFFICE FOR PROCESSING.   °DO NOT GIVE THEM TO YOUR DOCTOR. ° °1. A  prescription for pain medication may be given to you upon discharge.  Take your pain medication as prescribed, if needed.  If narcotic pain medicine is not needed, then you may take acetaminophen (Tylenol) or ibuprofen (Advil) as needed. °2. Take your usually prescribed medications unless otherwise directed. °If you need a refill on your pain medication, please contact your pharmacy.  They will contact our office to request authorization. Prescriptions will not be filled after 5 pm or on week-ends. °3. You should follow a light diet the first 24 hours after arrival home, such as soup and crackers, etc.  Be sure to include lots of fluids daily.  Resume your normal diet the day after surgery. °4.Most patients will experience some swelling and bruising around the umbilicus or in the groin and scrotum.  Ice packs and reclining will help.  Swelling and bruising can take several days to resolve.  °6. It is common to experience some constipation if taking pain medication after surgery.  Increasing fluid intake and taking a stool softener (such as Colace) will usually help or prevent this problem from occurring.  A mild laxative (Milk of Magnesia or Miralax) should be taken according to package directions if there are no bowel movements after 48 hours. °7. Unless discharge instructions indicate otherwise, you may remove your bandages 24-48 hours after surgery, and you may shower at that time.  You may have steri-strips (small skin tapes) in place directly over the incision.  These strips should be left on the skin for 7-10 days.  If your surgeon used skin glue on the incision, you may shower in  24 hours.  The glue will flake off over the next 2-3 weeks.  Any sutures or staples will be removed at the office during your follow-up visit. °8. ACTIVITIES:  You may resume regular (light) daily activities beginning the next day--such as daily self-care, walking, climbing stairs--gradually increasing activities as tolerated.  You may have sexual intercourse when it is comfortable.  Refrain from any heavy lifting or straining until approved by your doctor. ° °a.You may drive when you are no longer taking prescription pain medication, you can comfortably wear a seatbelt, and you can safely maneuver your car and apply brakes. °b.RETURN TO WORK:   °_____________________________________________ ° °9.You should see your doctor in the office for a follow-up appointment approximately 2-3 weeks after your surgery.  Make sure that you call for this appointment within a day or two after you arrive home to insure a convenient appointment time. °10.OTHER INSTRUCTIONS: _________________________ °   _____________________________________ ° °WHEN TO CALL YOUR DOCTOR: °1. Fever over 101.0 °2. Inability to urinate °3. Nausea and/or vomiting °4. Extreme swelling or bruising °5. Continued bleeding from incision. °6. Increased pain, redness, or drainage from the incision ° °The clinic staff is available to answer your questions during regular business hours.  Please don’t hesitate to call and ask to speak to one of the nurses for clinical concerns.  If you have a medical emergency, go to the nearest emergency room or call 911.  A surgeon from [facility] is always on call at the hospital ° ° °[address], [address], [place], [place]  [address] ? ° [address] [address], [place], [place]   [address] °[phone] ? [phone] ? FAX [phone] °Web site: [email or web address] ° °

## 2017-01-24 NOTE — Op Note (Signed)
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  PRE-OPERATIVE DIAGNOSIS:  Right inguinal hernia  POST-OPERATIVE DIAGNOSIS:  Right indirect inguinal hernia  PROCEDURE:  Procedure(s): LAPAROSCOPIC RIGHT INGUINAL HERNIA WITH MESH (Right) INSERTION OF MESH (Right)  SURGEON:  Surgeon(s) and Role:    Ralene Ok, MD - Primary  ASSISTANTS: none   ANESTHESIA:   local and general  EBL:  5cc  BLOOD ADMINISTERED:none  DRAINS: none   LOCAL MEDICATIONS USED:  BUPIVICAINE   SPECIMEN:  No Specimen  DISPOSITION OF SPECIMEN:  N/A  COUNTS:  YES  TOURNIQUET:  * No tourniquets in log *  DICTATION: .Dragon Dictation   Counts: reported as correct x 2  Findings:  The patient had a large right indirect hernia  Indications for procedure:  The patient is a 76 year old male with a right hernia for several months. Patient complained of symptomatology to his right inguinal area. The patient was taken back for elective inguinal hernia repair.  Details of the procedure: The patient was taken back to the operating room. The patient was placed in supine position with bilateral SCDs in place.  The patient was prepped and draped in the usual sterile fashion.  After appropriate anitbiotics were confirmed, a time-out was confirmed and all facts were verified.  0.25% Marcaine was used to infiltrate the umbilical area. A 11-blade was used to cut down the skin and blunt dissection was used to get the anterior fashion.  The anterior fascia was incised approximately 1 cm and the muscles were retracted laterally. Blunt dissection was then used to create a space in the preperitoneal area. At this time a 10 mm camera was then introduced into the space and advanced the pubic tubercle and a 12 mm trocar was placed over this and insufflation was started.  At this time and space was created from medial to laterally the preperitoneal space.  Cooper's ligament was initially cleaned off.  The  hernia sac was identified in the indirect space. Dissection of the hernia sac was undertaken the vas deferens was identified and protected in all parts of the case.    Once the hernia sac was taken down to approximately the umbilicus a Bard 3D Max mesh, size: Rachelle Hora, was  introduced into the preperitoneal space.  The mesh was brought over to cover the direct and indirect hernia spaces.  This was anchored into place and secured to Cooper's ligament with 4.52mm staples from a Coviden hernia stapler. It was anchored to the anterior abdominal wall with 4.8 mm staples. The hernia sac was seen lying posterior to the mesh. There was no staples placed laterally. The insufflation was evacuated and the peritoneum was seen posterior to the mesh. The trochars were removed. The anterior fascia was reapproximated using #1 Vicryl on a UR- 6.  Intra-abdominal air was evacuated and the Veress needle removed. The skin was reapproximated using 4-0 Monocryl subcuticular fashion the patient was awakened from general anesthesia and taken to recovery in stable condition.   PLAN OF CARE: Discharge to home after PACU  PATIENT DISPOSITION:  PACU - hemodynamically stable.   Delay start of Pharmacological VTE agent (>24hrs) due to surgical blood loss or risk of bleeding: not applicable

## 2017-01-24 NOTE — H&P (View-Only) (Signed)
History of Present Illness Ralene Ok MD; 01/01/2017 3:02 PM) The patient is a 76 year old male who presents with an inguinal hernia. The patient is a 76 year old male is referred by Dr. Billey Gosling for evaluation of right inguinal hernia. Patient states she's had this there for several weeks to months. He states it is bothersome. He is active. He is currently retired. Patient states she's had no signs or symptoms of incarceration or strangulation. All other reviews systems are negative.   Allergies (Yasmin Izola Price, CMA; 01/01/2017 2:48 PM) Simvastatin *ANTIHYPERLIPIDEMICS*  Muscle aches and joint pain  Medication History (Yasmin Roberson, CMA; 01/01/2017 2:49 PM) Tamsulosin HCl (0.4MG  Capsule, Oral) Active. Finasteride (5MG  Tablet, Oral) Active. Ibuprofen (400MG  Tablet, Oral) Active. Medications Reconciled    Review of Systems Ralene Ok, MD; 01/01/2017 3:3 PM) General Not Present- Fever. Respiratory Not Present- Cough and Difficulty Breathing. Cardiovascular Not Present- Chest Pain. Gastrointestinal Present- Abdominal Pain. Musculoskeletal Not Present- Myalgia. Neurological Not Present- Weakness.  Vitals (Yasmin Roberson CMA; 01/01/2017 2:48 PM) 01/01/2017 2:47 PM Weight: 201.8 lb Height: 70.5in Body Surface Area: 2.11 m Body Mass Index: 28.55 kg/m  Temp.: 98.35F  Pulse: 96 (Regular)  BP: 142/78 (Sitting, Left Arm, Standard)       Physical Exam Ralene Ok, MD; 01/01/2017 3:3 PM) General Mental Status-Alert. General Appearance-Consistent with stated age. Hydration-Well hydrated. Voice-Normal.  Head and Neck Head-normocephalic, atraumatic with no lesions or palpable masses. Trachea-midline.  Eye Eyeball - Bilateral-Extraocular movements intact. Sclera/Conjunctiva - Bilateral-No scleral icterus.  Chest and Lung Exam Chest and lung exam reveals -quiet, even and easy respiratory effort with no use of accessory  muscles. Inspection Chest Wall - Normal. Back - normal.  Cardiovascular Cardiovascular examination reveals -normal heart sounds, regular rate and rhythm with no murmurs.  Abdomen Inspection Skin - Scar - no surgical scars. Hernias - Inguinal hernia - Right - Reducible. Palpation/Percussion Normal exam - Soft, Non Tender, No Rebound tenderness, No Rigidity (guarding) and No hepatosplenomegaly. Auscultation Normal exam - Bowel sounds normal.  Neurologic Neurologic evaluation reveals -alert and oriented x 3 with no impairment of recent or remote memory. Mental Status-Normal.  Musculoskeletal Normal Exam - Left-Upper Extremity Strength Normal and Lower Extremity Strength Normal. Normal Exam - Right-Upper Extremity Strength Normal, Lower Extremity Weakness.    Assessment & Plan Ralene Ok MD; 01/01/2017 3:03 PM) RIGHT INGUINAL HERNIA (K40.90) Impression: 76 year old male with a right inguinal hernia. 1. The patient will like to proceed to the operating room for laparoscopic right inguinal hernia repair with mesh.  2. I discussed with the patient the signs and symptoms of incarceration and strangulation and the need to proceed to the ER should they occur.  3. I discussed with the patient the risks and benefits of the procedure to include but not limited to: Infection, bleeding, damage to surrounding structures, possible need for further surgery, possible nerve pain, and possible recurrence. The patient was understanding and wishes to proceed.

## 2017-01-24 NOTE — Anesthesia Procedure Notes (Signed)
Procedure Name: Intubation Date/Time: 01/24/2017 7:31 AM Performed by: Mervyn Gay Pre-anesthesia Checklist: Patient identified, Patient being monitored, Timeout performed, Emergency Drugs available and Suction available Patient Re-evaluated:Patient Re-evaluated prior to inductionOxygen Delivery Method: Circle System Utilized Preoxygenation: Pre-oxygenation with 100% oxygen Intubation Type: IV induction, Rapid sequence and Cricoid Pressure applied Laryngoscope Size: Miller and 3 Grade View: Grade I Tube type: Oral Tube size: 8.0 mm Number of attempts: 1 Airway Equipment and Method: Stylet Placement Confirmation: ETT inserted through vocal cords under direct vision,  positive ETCO2 and breath sounds checked- equal and bilateral Secured at: 22 cm Tube secured with: Tape Dental Injury: Teeth and Oropharynx as per pre-operative assessment

## 2017-01-24 NOTE — Interval H&P Note (Signed)
History and Physical Interval Note:  01/24/2017 7:19 AM  [name], MD  has presented today for surgery, with the diagnosis of Right inguinal hernia  The various methods of treatment have been discussed with the patient and family. After consideration of risks, benefits and other options for treatment, the patient has consented to  Procedure(s): LAPAROSCOPIC RIGHT INGUINAL HERNIA WITH MESH (Right) INSERTION OF MESH (Right) as a surgical intervention .  The patient's history has been reviewed, patient examined, no change in status, stable for surgery.  I have reviewed the patient's chart and labs.  Questions were answered to the patient's satisfaction.     Rosario Jacks., Anne Hahn

## 2017-01-27 ENCOUNTER — Encounter (HOSPITAL_COMMUNITY): Payer: Self-pay | Admitting: General Surgery

## 2017-01-27 NOTE — Progress Notes (Signed)
Subjective:    Patient ID: Joe Blalock, MD, male    DOB: 1940-12-19, 76 y.o.   MRN: [id number]  HPI He is here for a physical exam.   He had repair of his inguinal hernia the end of last week.  He is recovering well.    Skin change on left cheek:  He has had it there efor  Months.  No change.  No itch.  Worse during summer.  Sometimes peels off. Never had skin check.  No other skin changes.   BPH:  He stopped his medications prior to surgery and he denies any adverse reaction or changes in urination.  He denies dysuria, hematuria and difficulty initiating urination.  He would like to continue off medications for now.    Medications and allergies reviewed with patient and updated if appropriate.  Patient Active Problem List   Diagnosis Date Noted  . Bronchitis 07/09/2016  . Wears hearing aid 07/04/2016  . 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 Hyperlipidemia 06/29/2016  . 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 Vitamin D deficiency 06/29/2016  . BPH (benign prostatic hyperplasia) 06/29/2016    Current Outpatient Prescriptions on File Prior to Visit  Medication Sig Dispense Refill  . ibuprofen (ADVIL,MOTRIN) 200 MG tablet Take 1 tablet (200 mg total) by mouth every 6 (six) hours as needed. (Patient taking differently: Take 200 mg by mouth every 6 (six) hours as needed for mild pain. ) 30 tablet 0  . aspirin EC 81 MG tablet Take 81 mg by mouth 3 (three) times a week.     No current facility-administered medications on file prior to visit.     No past medical history on file.  Past Surgical History:  Procedure Laterality Date  . CHOLECYSTECTOMY    . INGUINAL HERNIA REPAIR Right 01/24/2017   Procedure: LAPAROSCOPIC RIGHT INGUINAL HERNIA WITH MESH;  Surgeon: Ralene Ok, MD;  Location: [facility];  Service: General;  Laterality: Right;  . INSERTION OF MESH Right 01/24/2017   Procedure: INSERTION OF MESH;  Surgeon: Ralene Ok, MD;  Location: [facility];  Service: General;   Laterality: Right;    Social History   Social History  . Marital status: Married    Spouse name: N/A  . Number of children: N/A  . Years of education: N/A   Social History Main Topics  . Smoking status: Former Research scientist (life sciences)  . Smokeless tobacco: Never Used  . Alcohol use No  . Drug use: No  . Sexual activity: Not Asked   Other Topics Concern  . None   Social History Narrative  . None    Family History  Problem Relation Age of Onset  . Arthritis Mother   . Heart disease Father   . Arthritis Father   . Diabetes Maternal Aunt   . Cancer Paternal Uncle        lung  . Stroke Maternal Grandmother   . Diabetes Maternal Grandmother     Review of Systems  Constitutional: Negative for appetite change, chills, fatigue and fever.  Eyes: Negative for visual disturbance.  Respiratory: Negative for cough, shortness of breath and wheezing.   Cardiovascular: Negative for chest pain, palpitations and leg swelling.  Gastrointestinal: Negative for abdominal pain, blood in stool, constipation, diarrhea and nausea.       No gerd  Genitourinary: Negative for difficulty urinating, dysuria and hematuria.  Musculoskeletal: Negative for back pain.  Skin: Positive for color change. Negative for rash.  Neurological: Negative for dizziness, light-headedness and headaches.  Psychiatric/Behavioral: Negative for dysphoric mood. The patient is not nervous/anxious.        Objective:   Vitals:   01/28/17 0828  BP: 134/86  Pulse: 83  Resp: 16  Temp: 97.9 F (36.6 C)   Filed Weights   01/28/17 0828  Weight: 200 lb (90.7 kg)   Body mass index is 27.89 kg/m.  Wt Readings from Last 3 Encounters:  01/28/17 200 lb (90.7 kg)  01/14/17 203 lb 5 oz (92.2 kg)  12/19/16 202 lb (91.6 kg)     Physical Exam Constitutional: He appears well-developed and well-nourished. No distress.  HENT:  Head: Normocephalic and atraumatic.  Right Ear: External ear normal.  Left Ear: External ear normal.    Mouth/Throat: Oropharynx is clear and moist.  Normal ear canals and TM b/l  Eyes: Conjunctivae and EOM are normal.  Neck: Neck supple. No tracheal deviation present. No thyromegaly present.  No carotid bruit  Cardiovascular: Normal rate, regular rhythm, normal heart sounds and intact distal pulses.   No murmur heard. Pulmonary/Chest: Effort normal and breath sounds normal. No respiratory distress. He has no wheezes. He has no rales.  Abdominal: Soft. Bruising near umbilicus from recent hernia surgery. He exhibits no distension. There is no tenderness.  Genitourinary: deferred  Musculoskeletal: He exhibits no edema.  Lymphadenopathy:   He has no cervical adenopathy.  Skin: Skin is warm and dry. He is not diaphoretic. dry area with mild surrounding erythema on left cheek Psychiatric: He has a normal mood and affect. His behavior is normal.         Assessment & Plan:   Physical exam: Screening blood work  reviewed Immunizations  Tetanus due -  deferred, discussed shingrix Colonoscopy   Done in 2013, given 10 years Eye exams   Up to date  EKG  Done 01/2017 Exercise yard work, walking Weight  overweight Skin  Skin color change on left cheek -  Recommended skin check by dermatology Substance abuse  none  See Problem List for Assessment and Plan of chronic medical problems.   FU in one year

## 2017-01-28 ENCOUNTER — Ambulatory Visit (INDEPENDENT_AMBULATORY_CARE_PROVIDER_SITE_OTHER): Payer: Medicare Other | Admitting: Internal Medicine

## 2017-01-28 ENCOUNTER — Encounter: Payer: Self-pay | Admitting: Internal Medicine

## 2017-01-28 VITALS — BP 134/86 | HR 83 | Temp 97.9°F | Resp 16 | Ht 71.0 in | Wt 200.0 lb

## 2017-01-28 DIAGNOSIS — N400 Enlarged prostate without lower urinary tract symptoms: Secondary | ICD-10-CM

## 2017-01-28 DIAGNOSIS — E7800 Pure hypercholesterolemia, unspecified: Secondary | ICD-10-CM | POA: Diagnosis not present

## 2017-01-28 DIAGNOSIS — Z0000 Encounter for general adult medical examination without abnormal findings: Secondary | ICD-10-CM | POA: Diagnosis not present

## 2017-01-28 NOTE — Assessment & Plan Note (Signed)
Did not tolerate zocor or lipitor Lipids high He is not interested in taking any other medication for his cholesterol  Continue healthy diet and regular exercise

## 2017-01-28 NOTE — Assessment & Plan Note (Signed)
Stopped flomax and proscar No difficulty with urinating - will continue off medication

## 2017-01-28 NOTE — Patient Instructions (Signed)
 All other Health Maintenance issues reviewed.   All recommended immunizations and age-appropriate screenings are up-to-date or discussed.  No immunizations administered today.   Medications reviewed and updated.  No changes recommended at this time.    Please followup in one year    Health Maintenance, Male A healthy lifestyle and preventive care is important for your health and wellness. Ask your health care provider about what schedule of regular examinations is right for you. What should I know about weight and diet? Eat a Healthy Diet  Eat plenty of vegetables, fruits, whole grains, low-fat dairy products, and lean protein.  Do not eat a lot of foods high in solid fats, added sugars, or salt.  Maintain a Healthy Weight Regular exercise can help you achieve or maintain a healthy weight. You should:  Do at least 150 minutes of exercise each week. The exercise should increase your heart rate and make you sweat (moderate-intensity exercise).  Do strength-training exercises at least twice a week.  Watch Your Levels of Cholesterol and Blood Lipids  Have your blood tested for lipids and cholesterol every 5 years starting at 75 years of age. If you are at high risk for heart disease, you should start having your blood tested when you are 75 years old. You may need to have your cholesterol levels checked more often if: ? Your lipid or cholesterol levels are high. ? You are older than 75 years of age. ? You are at high risk for heart disease.  What should I know about cancer screening? Many types of cancers can be detected early and may often be prevented. Lung Cancer  You should be screened every year for lung cancer if: ? You are a current smoker who has smoked for at least 30 years. ? You are a former smoker who has quit within the past 15 years.  Talk to your health care provider about your screening options, when you should start screening, and how often you should be  screened.  Colorectal Cancer  Routine colorectal cancer screening usually begins at 75 years of age and should be repeated every 5-10 years until you are 75 years old. You may need to be screened more often if early forms of precancerous polyps or small growths are found. Your health care provider may recommend screening at an earlier age if you have risk factors for colon cancer.  Your health care provider may recommend using home test kits to check for hidden blood in the stool.  A small camera at the end of a tube can be used to examine your colon (sigmoidoscopy or colonoscopy). This checks for the earliest forms of colorectal cancer.  Prostate and Testicular Cancer  Depending on your age and overall health, your health care provider may do certain tests to screen for prostate and testicular cancer.  Talk to your health care provider about any symptoms or concerns you have about testicular or prostate cancer.  Skin Cancer  Check your skin from head to toe regularly.  Tell your health care provider about any new moles or changes in moles, especially if: ? There is a change in a mole's size, shape, or color. ? You have a mole that is larger than a pencil eraser.  Always use sunscreen. Apply sunscreen liberally and repeat throughout the day.  Protect yourself by wearing long sleeves, pants, a wide-brimmed hat, and sunglasses when outside.  What should I know about heart disease, diabetes, and high blood pressure?  If you   are 38-34 years of age, have your blood pressure checked every 3-5 years. If you are 52 years of age or older, have your blood pressure checked every year. You should have your blood pressure measured twice-once when you are at a hospital or clinic, and once when you are not at a hospital or clinic. Record the average of the two measurements. To check your blood pressure when you are not at a hospital or clinic, you can use: ? An automated blood pressure machine at a  pharmacy. ? A home blood pressure monitor.  Talk to your health care provider about your target blood pressure.  If you are between 81-94 years old, ask your health care provider if you should take aspirin to prevent heart disease.  Have regular diabetes screenings by checking your fasting blood sugar level. ? If you are at a normal weight and have a low risk for diabetes, have this test once every three years after the age of 44. ? If you are overweight and have a high risk for diabetes, consider being tested at a younger age or more often.  A one-time screening for abdominal aortic aneurysm (AAA) by ultrasound is recommended for men aged 47-75 years who are current or former smokers. What should I know about preventing infection? Hepatitis B If you have a higher risk for hepatitis B, you should be screened for this virus. Talk with your health care provider to find out if you are at risk for hepatitis B infection. Hepatitis C Blood testing is recommended for:  Everyone born from [birth year] through 1965.  Anyone with known risk factors for hepatitis C.  Sexually Transmitted Diseases (STDs)  You should be screened each year for STDs including gonorrhea and chlamydia if: ? You are sexually active and are younger than 75 years of age. ? You are older than 75 years of age and your health care provider tells you that you are at risk for this type of infection. ? Your sexual activity has changed since you were last screened and you are at an increased risk for chlamydia or gonorrhea. Ask your health care provider if you are at risk.  Talk with your health care provider about whether you are at high risk of being infected with HIV. Your health care provider may recommend a prescription medicine to help prevent HIV infection.  What else can I do?  Schedule regular health, dental, and eye exams.  Stay current with your vaccines (immunizations).  Do not use any tobacco products, such as  cigarettes, chewing tobacco, and e-cigarettes. If you need help quitting, ask your health care provider.  Limit alcohol intake to no more than 2 drinks per day. One drink equals 12 ounces of beer, 5 ounces of wine, or 1 ounces of hard liquor.  Do not use street drugs.  Do not share needles.  Ask your health care provider for help if you need support or information about quitting drugs.  Tell your health care provider if you often feel depressed.  Tell your health care provider if you have ever been abused or do not feel safe at home. This information is not intended to replace advice given to you by your health care provider. Make sure you discuss any questions you have with your health care provider. Document Released: 02/01/2008 Document Revised: 04/03/2016 Document Reviewed: 05/09/2015 Elsevier Interactive Patient Education  [employer].

## 2017-05-20 ENCOUNTER — Ambulatory Visit (INDEPENDENT_AMBULATORY_CARE_PROVIDER_SITE_OTHER): Payer: Medicare Other

## 2017-05-20 DIAGNOSIS — Z23 Encounter for immunization: Secondary | ICD-10-CM

## 2018-02-01 NOTE — Progress Notes (Signed)
Subjective:    Patient ID: Joe Blalock, MD, male    DOB: [birth year], 77 y.o.   MRN: [id number]  HPI He is here for a physical exam.  His wife is here with him  He is exercising regularly and eats healthy.  He denies any changes in his health since he was here last.  He has no concerns.  His last colonoscopy was in 2013-this was done in [place].  He is not taking any prescription medication and would like to avoid it.  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  This does work, but he does not take too much and only takes 1 or 2 doses.  Because anything very physical it does take him several days to recover because his arthritis will flareup.  Medications and allergies reviewed with patient and updated if appropriate.  Patient Active Problem List   Diagnosis Date Noted  . Wears hearing aid 07/04/2016  . 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 Hyperlipidemia 06/29/2016  . 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 Vitamin D deficiency 06/29/2016  . BPH (benign prostatic hyperplasia) 06/29/2016    Current Outpatient Medications on File Prior to Visit  Medication Sig Dispense Refill  . aspirin EC 81 MG tablet Take 81 mg by mouth 3 (three) times a week.    Marland Kitchen ibuprofen (ADVIL,MOTRIN) 200 MG tablet Take 1 tablet (200 mg total) by mouth every 6 (six) hours as needed. (Patient taking differently: Take 200 mg by mouth every 6 (six) hours as needed for mild pain. ) 30 tablet 0  . Turmeric 500 MG TABS Take 500 mg by mouth daily.     No current facility-administered medications on file prior to visit.     No past medical history on file.  Past Surgical History:  Procedure Laterality Date  . CHOLECYSTECTOMY    . INGUINAL HERNIA REPAIR Right 01/24/2017   Procedure: LAPAROSCOPIC RIGHT INGUINAL HERNIA WITH MESH;  Surgeon: Ralene Ok, MD;  Location: [facility];  Service: General;  Laterality: Right;  . INSERTION OF MESH Right 01/24/2017   Procedure: INSERTION OF  MESH;  Surgeon: Ralene Ok, MD;  Location: [facility];  Service: General;  Laterality: Right;    Social History   Socioeconomic History  . Marital status: Married    Spouse name: Not on file  . Number of children: Not on file  . Years of education: Not on file  . Highest education level: Not on file  Occupational History  . Not on file  Social Needs  . Financial resource strain: Not on file  . Food insecurity:    Worry: Not on file    Inability: Not on file  . Transportation needs:    Medical: Not on file    Non-medical: Not on file  Tobacco Use  . Smoking status: Former Research scientist (life sciences)  . Smokeless tobacco: Never Used  Substance and Sexual Activity  . Alcohol use: No  . Drug use: No  . Sexual activity: Not on file  Lifestyle  . Physical activity:    Days per week: Not on file    Minutes per session: Not on file  . Stress: Not on file  Relationships  . Social connections:    Talks on phone: Not on file    Gets together: Not on file    Attends religious service: Not on file    Active member of club or organization: Not on file    Attends meetings of clubs or organizations:  Not on file    Relationship status: Not on file  Other Topics Concern  . Not on file  Social History Narrative  . Not on file    Family History  Problem Relation Age of Onset  . Arthritis Mother   . Heart disease Father   . Arthritis Father   . Diabetes Maternal Aunt   . Cancer Paternal Uncle        lung  . Stroke Maternal Grandmother   . Diabetes Maternal Grandmother     Review of Systems  Constitutional: Negative for chills, fatigue and fever.  Eyes: Negative for visual disturbance.  Respiratory: Negative for cough, shortness of breath and wheezing.   Cardiovascular: Negative for chest pain, palpitations and leg swelling.  Gastrointestinal: Negative for abdominal pain, blood in stool, constipation, diarrhea and nausea.       No gerd  Genitourinary: Negative for dysuria and hematuria.    Musculoskeletal: Negative for arthralgias and back pain.  Skin: Negative for color change and rash.  Neurological: Negative for dizziness, light-headedness and headaches.  Psychiatric/Behavioral: Negative for dysphoric mood. The patient is not nervous/anxious.        Objective:   Vitals:   02/02/18 0921  BP: 132/76  Pulse: 78  Resp: 16  Temp: 97.8 F (36.6 C)  SpO2: 97%   Filed Weights   02/02/18 0921  Weight: 213 lb (96.6 kg)   Body mass index is 29.71 kg/m.  Wt Readings from Last 3 Encounters:  02/02/18 213 lb (96.6 kg)  01/28/17 200 lb (90.7 kg)  01/14/17 203 lb 5 oz (92.2 kg)     Physical Exam Constitutional: He appears well-developed and well-nourished. No distress.  HENT:  Head: Normocephalic and atraumatic.  Right Ear: External ear normal.  Left Ear: External ear normal.  Mouth/Throat: Oropharynx is clear and moist.  Normal ear canals and TM b/l  Eyes: Conjunctivae and EOM are normal.  Neck: Neck supple. No tracheal deviation present. No thyromegaly present.  No carotid bruit  Cardiovascular: Normal rate, regular rhythm, normal heart sounds and intact distal pulses.   No murmur heard. Pulmonary/Chest: Effort normal and breath sounds normal. No respiratory distress. He has no wheezes. He has no rales.  Abdominal: Soft.  Ventral hernia-reducible, nontender.  He exhibits no distension. There is no tenderness.  Genitourinary: deferred  Musculoskeletal: He exhibits no edema.  Lymphadenopathy:   He has no cervical adenopathy.  Skin: Skin is warm and dry. He is not diaphoretic.  Psychiatric: He has a normal mood and affect. His behavior is normal.         Assessment & Plan:   Physical exam: Screening blood work  ordered Immunizations deferred Tdap, discussed Shingrix, others up to date Colonoscopy   Last colonoscopy not on record - 09/27/2011 in [place] exams  Up to date  EKG   Done 01/2017 Exercise   regular Weight  Mildly overweight Skin       no concerns Substance abuse   none  See Problem List for Assessment and Plan of chronic medical problems.   FU in 1 year

## 2018-02-01 NOTE — Patient Instructions (Addendum)
Test(s) ordered today. Your results will be released to MyChart (or called to you) after review, usually within 72hours after test completion. If any changes need to be made, you will be notified at that same time.  All other Health Maintenance issues reviewed.   All recommended immunizations and age-appropriate screenings are up-to-date or discussed.  No immunizations administered today.   Medications reviewed and updated.  No changes recommended at this time.   Please followup in one year    Health Maintenance, Male A healthy lifestyle and preventive care is important for your health and wellness. Ask your health care provider about what schedule of regular examinations is right for you. What should I know about weight and diet? Eat a Healthy Diet  Eat plenty of vegetables, fruits, whole grains, low-fat dairy products, and lean protein.  Do not eat a lot of foods high in solid fats, added sugars, or salt.  Maintain a Healthy Weight Regular exercise can help you achieve or maintain a healthy weight. You should:  Do at least 150 minutes of exercise each week. The exercise should increase your heart rate and make you sweat (moderate-intensity exercise).  Do strength-training exercises at least twice a week.  Watch Your Levels of Cholesterol and Blood Lipids  Have your blood tested for lipids and cholesterol every 5 years starting at 76 years of age. If you are at high risk for heart disease, you should start having your blood tested when you are 76 years old. You may need to have your cholesterol levels checked more often if: ? Your lipid or cholesterol levels are high. ? You are older than 76 years of age. ? You are at high risk for heart disease.  What should I know about cancer screening? Many types of cancers can be detected early and may often be prevented. Lung Cancer  You should be screened every year for lung cancer if: ? You are a current smoker who has smoked for  at least 30 years. ? You are a former smoker who has quit within the past 15 years.  Talk to your health care provider about your screening options, when you should start screening, and how often you should be screened.  Colorectal Cancer  Routine colorectal cancer screening usually begins at 76 years of age and should be repeated every 5-10 years until you are 75 years old. You may need to be screened more often if early forms of precancerous polyps or small growths are found. Your health care provider may recommend screening at an earlier age if you have risk factors for colon cancer.  Your health care provider may recommend using home test kits to check for hidden blood in the stool.  A small camera at the end of a tube can be used to examine your colon (sigmoidoscopy or colonoscopy). This checks for the earliest forms of colorectal cancer.  Prostate and Testicular Cancer  Depending on your age and overall health, your health care provider may do certain tests to screen for prostate and testicular cancer.  Talk to your health care provider about any symptoms or concerns you have about testicular or prostate cancer.  Skin Cancer  Check your skin from head to toe regularly.  Tell your health care provider about any new moles or changes in moles, especially if: ? There is a change in a mole's size, shape, or color. ? You have a mole that is larger than a pencil eraser.  Always use sunscreen. Apply sunscreen liberally   and repeat throughout the day.  Protect yourself by wearing long sleeves, pants, a wide-brimmed hat, and sunglasses when outside.  What should I know about heart disease, diabetes, and high blood pressure?  If you are 18-39 years of age, have your blood pressure checked every 3-5 years. If you are 40 years of age or older, have your blood pressure checked every year. You should have your blood pressure measured twice-once when you are at a hospital or clinic, and once  when you are not at a hospital or clinic. Record the average of the two measurements. To check your blood pressure when you are not at a hospital or clinic, you can use: ? An automated blood pressure machine at a pharmacy. ? A home blood pressure monitor.  Talk to your health care provider about your target blood pressure.  If you are between 45-79 years old, ask your health care provider if you should take aspirin to prevent heart disease.  Have regular diabetes screenings by checking your fasting blood sugar level. ? If you are at a normal weight and have a low risk for diabetes, have this test once every three years after the age of 45. ? If you are overweight and have a high risk for diabetes, consider being tested at a younger age or more often.  A one-time screening for abdominal aortic aneurysm (AAA) by ultrasound is recommended for men aged 65-75 years who are current or former smokers. What should I know about preventing infection? Hepatitis B If you have a higher risk for hepatitis B, you should be screened for this virus. Talk with your health care provider to find out if you are at risk for hepatitis B infection. Hepatitis C Blood testing is recommended for:  Everyone born from 1945 through 1965.  Anyone with known risk factors for hepatitis C.  Sexually Transmitted Diseases (STDs)  You should be screened each year for STDs including gonorrhea and chlamydia if: ? You are sexually active and are younger than 76 years of age. ? You are older than 76 years of age and your health care provider tells you that you are at risk for this type of infection. ? Your sexual activity has changed since you were last screened and you are at an increased risk for chlamydia or gonorrhea. Ask your health care provider if you are at risk.  Talk with your health care provider about whether you are at high risk of being infected with HIV. Your health care provider may recommend a prescription  medicine to help prevent HIV infection.  What else can I do?  Schedule regular health, dental, and eye exams.  Stay current with your vaccines (immunizations).  Do not use any tobacco products, such as cigarettes, chewing tobacco, and e-cigarettes. If you need help quitting, ask your health care provider.  Limit alcohol intake to no more than 2 drinks per day. One drink equals 12 ounces of beer, 5 ounces of wine, or 1 ounces of hard liquor.  Do not use street drugs.  Do not share needles.  Ask your health care provider for help if you need support or information about quitting drugs.  Tell your health care provider if you often feel depressed.  Tell your health care provider if you have ever been abused or do not feel safe at home. This information is not intended to replace advice given to you by your health care provider. Make sure you discuss any questions you have with your health   care provider. Document Released: 02/01/2008 Document Revised: 04/03/2016 Document Reviewed: 05/09/2015 Elsevier Interactive Patient Education  2018 [employer]  

## 2018-02-02 ENCOUNTER — Encounter: Payer: Self-pay | Admitting: Internal Medicine

## 2018-02-02 ENCOUNTER — Ambulatory Visit (INDEPENDENT_AMBULATORY_CARE_PROVIDER_SITE_OTHER): Payer: Medicare Other | Admitting: Internal Medicine

## 2018-02-02 ENCOUNTER — Other Ambulatory Visit (INDEPENDENT_AMBULATORY_CARE_PROVIDER_SITE_OTHER): Payer: Medicare Other

## 2018-02-02 VITALS — BP 132/76 | HR 78 | Temp 97.8°F | Resp 16 | Ht 71.0 in | Wt 213.0 lb

## 2018-02-02 DIAGNOSIS — Z0000 Encounter for general adult medical examination without abnormal findings: Secondary | ICD-10-CM

## 2018-02-02 DIAGNOSIS — M1991 Primary osteoarthritis, unspecified site: Secondary | ICD-10-CM

## 2018-02-02 DIAGNOSIS — E782 Mixed hyperlipidemia: Secondary | ICD-10-CM | POA: Diagnosis not present

## 2018-02-02 LAB — COMPREHENSIVE METABOLIC PANEL
ALT: 19 U/L (ref 0–53)
AST: 18 U/L (ref 0–37)
Albumin: 4.1 g/dL (ref 3.5–5.2)
Alkaline Phosphatase: 58 U/L (ref 39–117)
BUN: 12 mg/dL (ref 6–23)
CHLORIDE: 105 meq/L (ref 96–112)
CO2: 28 meq/L (ref 19–32)
CREATININE: 1.1 mg/dL (ref 0.40–1.50)
Calcium: 9.3 mg/dL (ref 8.4–10.5)
GFR: 69.23 mL/min (ref >60.00)
Glucose, Bld: 89 mg/dL (ref 70–99)
POTASSIUM: 4.4 meq/L (ref 3.5–5.1)
SODIUM: 141 meq/L (ref 135–145)
Total Bilirubin: 0.6 mg/dL (ref 0.2–1.2)
Total Protein: 7.3 g/dL (ref 6.0–8.3)

## 2018-02-02 LAB — CBC WITH DIFFERENTIAL/PLATELET
BASOS PCT: 0.5 % (ref 0.0–3.0)
Basophils Absolute: 0 10*3/uL (ref 0.0–0.1)
EOS ABS: 0.3 10*3/uL (ref 0.0–0.7)
EOS PCT: 3.8 % (ref 0.0–5.0)
HCT: 46 % (ref 39.0–52.0)
HEMOGLOBIN: 15.5 g/dL (ref 13.0–17.0)
Lymphocytes Relative: 41.4 % (ref 12.0–46.0)
Lymphs Abs: 3.6 10*3/uL (ref 0.7–4.0)
MCHC: 33.7 g/dL (ref 30.0–36.0)
MCV: 91.3 fl (ref 78.0–100.0)
Monocytes Absolute: 0.8 10*3/uL (ref 0.1–1.0)
Monocytes Relative: 9.3 % (ref 3.0–12.0)
NEUTROS ABS: 4 10*3/uL (ref 1.4–7.7)
Neutrophils Relative %: 45 % (ref 43.0–77.0)
PLATELETS: 250 10*3/uL (ref 150.0–400.0)
RBC: 5.04 Mil/uL (ref 4.22–5.81)
RDW: 13.7 % (ref 11.5–15.5)
WBC: 8.8 10*3/uL (ref 4.0–10.5)

## 2018-02-02 LAB — LIPID PANEL
CHOL/HDL RATIO: 7
Cholesterol: 229 mg/dL — ABNORMAL HIGH (ref 0–200)
HDL: 33.6 mg/dL — AB (ref >39.00)
NONHDL: 194.96
Triglycerides: 239 mg/dL — ABNORMAL HIGH (ref 0.0–149.0)
VLDL: 47.8 mg/dL — AB (ref 0.0–40.0)

## 2018-02-02 LAB — TSH: TSH: 2.6 u[IU]/mL (ref 0.35–4.50)

## 2018-02-02 LAB — LDL CHOLESTEROL, DIRECT: Direct LDL: 147 mg/dL

## 2018-02-02 NOTE — Assessment & Plan Note (Signed)
Check lipid panel  Does not want to take any medication Regular exercise and healthy diet encouraged

## 2018-02-02 NOTE — Assessment & Plan Note (Addendum)
Takes ibuprofen as needed, okay to continue Discussed other options We will continue ibuprofen-discussed that is okay to increase the amount that he takes and can take for a couple of days, but not chronically CMP to check kidney function Continue topical medication Can refer to orthopedic/sports medicine when needed

## 2018-02-06 ENCOUNTER — Encounter: Payer: Self-pay | Admitting: Emergency Medicine

## 2018-02-06 ENCOUNTER — Telehealth: Payer: Self-pay | Admitting: Internal Medicine

## 2018-02-06 NOTE — Telephone Encounter (Signed)
Copied from [place] [phone]. Topic: Quick Communication - See Telephone Encounter >> Feb 06, 2018  8:26 AM Nils Flack wrote: CRM for notification. See Telephone encounter for: 02/06/18. Wife called - pt wants to use diet to control cholesterol. He does not want to take medication.  He would also like copy of labs sent to him.  Cb is [phone]

## 2018-02-06 NOTE — Telephone Encounter (Signed)
Dr Quay Burow notified. Mailed results.

## 2018-05-18 ENCOUNTER — Ambulatory Visit (INDEPENDENT_AMBULATORY_CARE_PROVIDER_SITE_OTHER): Payer: Medicare Other

## 2018-05-18 DIAGNOSIS — Z23 Encounter for immunization: Secondary | ICD-10-CM | POA: Diagnosis not present

## 2018-06-17 ENCOUNTER — Ambulatory Visit: Payer: Medicare Other | Admitting: Internal Medicine

## 2018-06-17 ENCOUNTER — Encounter: Payer: Self-pay | Admitting: Internal Medicine

## 2018-06-17 ENCOUNTER — Ambulatory Visit (INDEPENDENT_AMBULATORY_CARE_PROVIDER_SITE_OTHER)
Admission: RE | Admit: 2018-06-17 | Discharge: 2018-06-17 | Disposition: A | Discharge status: Home / Self Care | Payer: Medicare Other | Source: Ambulatory Visit | Attending: Internal Medicine | Admitting: Internal Medicine

## 2018-06-17 VITALS — BP 152/90 | HR 88 | Temp 98.2°F | Resp 16 | Ht 71.0 in | Wt 218.8 lb

## 2018-06-17 DIAGNOSIS — L989 Disorder of the skin and subcutaneous tissue, unspecified: Secondary | ICD-10-CM | POA: Insufficient documentation

## 2018-06-17 DIAGNOSIS — M25561 Pain in right knee: Secondary | ICD-10-CM

## 2018-06-17 DIAGNOSIS — G8929 Other chronic pain: Secondary | ICD-10-CM

## 2018-06-17 NOTE — Assessment & Plan Note (Signed)
To skin abnormalities on his cheek-1 will likely heal with time looks to be like a laceration from the razor without evidence of infection The other abnormality may be precancerous-do recommend that he hold body skin check with dermatology-referred

## 2018-06-17 NOTE — Progress Notes (Signed)
Subjective:    Patient ID: Joe Blalock, MD, male    DOB: 1940/10/10, 77 y.o.   MRN: [id number]  HPI The patient is here for an acute visit.   Right knee pain:  He has had pain in the right knee for a few weeks.  he has a history of a meniscus tear.  He denies any injury within the past few months that may have caused his current pain.  The pain is throughout the knee and radiates up the leg toward the hip.  It often hurts after activity such as yard work.  He denies swelling.  He denies groin pain.  Skin abnormality: He has 2 places that are not healing on his left cheek.  He first noticed them 3-4 weeks ago.  He just recently stop shaving to see if that would help is 1 of them looks like a cut.  He thinks the shaving Aggravating it.     Medications and allergies reviewed with patient and updated if appropriate.  Patient Active Problem List   Diagnosis Date Noted  . Wears hearing aid 07/04/2016  . 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 Hyperlipidemia 06/29/2016  . 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 Vitamin D deficiency 06/29/2016  . BPH (benign prostatic hyperplasia) 06/29/2016    Current Outpatient Medications on File Prior to Visit  Medication Sig Dispense Refill  . ibuprofen (ADVIL,MOTRIN) 200 MG tablet Take 1 tablet (200 mg total) by mouth every 6 (six) hours as needed. (Patient taking differently: Take 200 mg by mouth every 6 (six) hours as needed for mild pain. ) 30 tablet 0   No current facility-administered medications on file prior to visit.     No past medical history on file.  Past Surgical History:  Procedure Laterality Date  . CHOLECYSTECTOMY    . INGUINAL HERNIA REPAIR Right 01/24/2017   Procedure: LAPAROSCOPIC RIGHT INGUINAL HERNIA WITH MESH;  Surgeon: Ralene Ok, MD;  Location: [place];  Service: General;  Laterality: Right;  . INSERTION OF MESH Right 01/24/2017   Procedure: INSERTION OF MESH;  Surgeon: Ralene Ok, MD;  Location: [place] [facility];   Service: General;  Laterality: Right;    Social History   Socioeconomic History  . Marital status: Married    Spouse name: Not on file  . Number of children: Not on file  . Years of education: Not on file  . Highest education level: Not on file  Occupational History  . Not on file  Social Needs  . Financial resource strain: Not on file  . Food insecurity:    Worry: Not on file    Inability: Not on file  . Transportation needs:    Medical: Not on file    Non-medical: Not on file  Tobacco Use  . Smoking status: Former Research scientist (life sciences)  . Smokeless tobacco: Never Used  Substance and Sexual Activity  . Alcohol use: No  . Drug use: No  . Sexual activity: Not on file  Lifestyle  . Physical activity:    Days per week: Not on file    Minutes per session: Not on file  . Stress: Not on file  Relationships  . Social connections:    Talks on phone: Not on file    Gets together: Not on file    Attends religious service: Not on file    Active member of club or organization: Not on file    Attends meetings of clubs or organizations: Not on file    Relationship status:  Not on file  Other Topics Concern  . Not on file  Social History Narrative  . Not on file    Family History  Problem Relation Age of Onset  . Arthritis Mother   . Heart disease Father   . Arthritis Father   . Diabetes Maternal Aunt   . Cancer Paternal Uncle        lung  . Stroke Maternal Grandmother   . Diabetes Maternal Grandmother     Review of Systems  Musculoskeletal: Positive for arthralgias. Negative for joint swelling and myalgias.  Skin: Positive for color change.  Neurological: Negative for weakness and numbness.       Objective:   Vitals:   06/17/18 1048  BP: (!) 152/90  Pulse: 88  Resp: 16  Temp: 98.2 F (36.8 C)  SpO2: 96%   BP Readings from Last 3 Encounters:  06/17/18 (!) 152/90  02/02/18 132/76  01/28/17 134/86   Wt Readings from Last 3 Encounters:  06/17/18 218 lb 12.8 oz (99.2  kg)  02/02/18 213 lb (96.6 kg)  01/28/17 200 lb (90.7 kg)   Body mass index is 30.52 kg/m.   Physical Exam  Constitutional: He appears well-developed and well-nourished. No distress.  Musculoskeletal: He exhibits no edema.  Right knee: No effusion or swelling.  No warmth.  No tenderness with palpation throughout the knee.  Full extension and flexion without pain.  No pain with manipulation.  No calf tenderness.  No quadricep tenderness.  No lateral hip tenderness  Skin: Skin is warm and dry. He is not diaphoretic. No erythema.  Left cheek: Very small skin laceration or ulceration-likely a cut from the razor-no erythema or discharge.  Second lesion is a dry spot-possible actinic keratosis-nontender           Assessment & Plan:    See Problem List for Assessment and Plan of chronic medical problems.

## 2018-06-17 NOTE — Assessment & Plan Note (Signed)
Possible arthritis Will check an x-ray At this point the pain is relatively mild and he does not want to see a specialist Taking ibuprofen as needed and not taking a daily-can continue Can also try Tylenol or topical arthritis cream

## 2018-06-17 NOTE — Patient Instructions (Signed)
Have an x-ray of your knee today.    A referral was ordered for Dermatology.

## 2018-06-22 ENCOUNTER — Telehealth: Payer: Self-pay

## 2018-06-22 NOTE — Telephone Encounter (Signed)
Wife is aware of results.

## 2018-06-22 NOTE — Telephone Encounter (Signed)
Copied from [place] [id number]. Topic: General - Other >> Jun 22, 2018 10:59 AM Antonieta Iba C wrote: Reason for CRM: pt's spouse called in for imaging results.  CB: [phone]

## 2018-10-26 DIAGNOSIS — H02889 Meibomian gland dysfunction of unspecified eye, unspecified eyelid: Secondary | ICD-10-CM | POA: Diagnosis not present

## 2018-10-26 DIAGNOSIS — H25813 Combined forms of age-related cataract, bilateral: Secondary | ICD-10-CM | POA: Diagnosis not present

## 2019-02-08 NOTE — Patient Instructions (Addendum)
Tests ordered today. Your results will be released to Joe Robertson (or called to you) after review, usually within 72hours after test completion. If any changes need to be made, you will be notified at that same time.  All other Health Maintenance issues reviewed.   All recommended immunizations and age-appropriate screenings are up-to-date or discussed.  No immunizations administered today.   Medications reviewed and updated.  Changes include :   none    Please followup in one year    Health Maintenance, Male A healthy lifestyle and preventive care is important for your health and wellness. Ask your health care provider about what schedule of regular examinations is right for you. What should I know about weight and diet? Eat a Healthy Diet  Eat plenty of vegetables, fruits, whole grains, low-fat dairy products, and lean protein.  Do not eat a lot of foods high in solid fats, added sugars, or salt.  Maintain a Healthy Weight Regular exercise can help you achieve or maintain a healthy weight. You should:  Do at least 150 minutes of exercise each week. The exercise should increase your heart rate and make you sweat (moderate-intensity exercise).  Do strength-training exercises at least twice a week. Watch Your Levels of Cholesterol and Blood Lipids  Have your blood tested for lipids and cholesterol every 5 years starting at 77 years of age. If you are at high risk for heart disease, you should start having your blood tested when you are 77 years old. You may need to have your cholesterol levels checked more often if: ? Your lipid or cholesterol levels are high. ? You are older than 77 years of age. ? You are at high risk for heart disease. What should I know about cancer screening? Many types of cancers can be detected early and may often be prevented. Lung Cancer  You should be screened every year for lung cancer if: ? You are a current smoker who has smoked for at least 30  years. ? You are a former smoker who has quit within the past 15 years.  Talk to your health care provider about your screening options, when you should start screening, and how often you should be screened. Colorectal Cancer  Routine colorectal cancer screening usually begins at 77 years of age and should be repeated every 5-10 years until you are 77 years old. You may need to be screened more often if early forms of precancerous polyps or small growths are found. Your health care provider may recommend screening at an earlier age if you have risk factors for colon cancer.  Your health care provider may recommend using home test kits to check for hidden blood in the stool.  A small camera at the end of a tube can be used to examine your colon (sigmoidoscopy or colonoscopy). This checks for the earliest forms of colorectal cancer. Prostate and Testicular Cancer  Depending on your age and overall health, your health care provider may do certain tests to screen for prostate and testicular cancer.  Talk to your health care provider about any symptoms or concerns you have about testicular or prostate cancer. Skin Cancer  Check your skin from head to toe regularly.  Tell your health care provider about any new moles or changes in moles, especially if: ? There is a change in a mole's size, shape, or color. ? You have a mole that is larger than a pencil eraser.  Always use sunscreen. Apply sunscreen liberally and repeat throughout the  day.  Protect yourself by wearing long sleeves, pants, a wide-brimmed hat, and sunglasses when outside. What should I know about heart disease, diabetes, and high blood pressure?  If you are 40-38 years of age, have your blood pressure checked every 3-5 years. If you are 15 years of age or older, have your blood pressure checked every year. You should have your blood pressure measured twice-once when you are at a hospital or clinic, and once when you are not at a  hospital or clinic. Record the average of the two measurements. To check your blood pressure when you are not at a hospital or clinic, you can use: ? An automated blood pressure machine at a pharmacy. ? A home blood pressure monitor.  Talk to your health care provider about your target blood pressure.  If you are between 39-46 years old, ask your health care provider if you should take aspirin to prevent heart disease.  Have regular diabetes screenings by checking your fasting blood sugar level. ? If you are at a normal weight and have a low risk for diabetes, have this test once every three years after the age of 28. ? If you are overweight and have a high risk for diabetes, consider being tested at a younger age or more often.  A one-time screening for abdominal aortic aneurysm (AAA) by ultrasound is recommended for men aged 20-75 years who are current or former smokers. What should I know about preventing infection? Hepatitis B If you have a higher risk for hepatitis B, you should be screened for this virus. Talk with your health care provider to find out if you are at risk for hepatitis B infection. Hepatitis C Blood testing is recommended for:  Everyone born from [birth year] through 1964.  Anyone with known risk factors for hepatitis C. Sexually Transmitted Diseases (STDs)  You should be screened each year for STDs including gonorrhea and chlamydia if: ? You are sexually active and are younger than 77 years of age. ? You are older than 77 years of age and your health care provider tells you that you are at risk for this type of infection. ? Your sexual activity has changed since you were last screened and you are at an increased risk for chlamydia or gonorrhea. Ask your health care provider if you are at risk.  Talk with your health care provider about whether you are at high risk of being infected with HIV. Your health care provider may recommend a prescription medicine to help prevent  HIV infection. What else can I do?  Schedule regular health, dental, and eye exams.  Stay current with your vaccines (immunizations).  Do not use any tobacco products, such as cigarettes, chewing tobacco, and e-cigarettes. If you need help quitting, ask your health care provider.  Limit alcohol intake to no more than 2 drinks per day. One drink equals 12 ounces of beer, 5 ounces of wine, or 1 ounces of hard liquor.  Do not use street drugs.  Do not share needles.  Ask your health care provider for help if you need support or information about quitting drugs.  Tell your health care provider if you often feel depressed.  Tell your health care provider if you have ever been abused or do not feel safe at home. This information is not intended to replace advice given to you by your health care provider. Make sure you discuss any questions you have with your health care provider. Document Released: 02/01/2008 Document Revised:  04/03/2016 Document Reviewed: 05/09/2015 Elsevier Interactive Patient Education  [employer].

## 2019-02-08 NOTE — Progress Notes (Signed)
Subjective:    Patient ID: Joe Blalock, MD, male    DOB: 07-05-1941, 78 y.o.   MRN: [id number]  HPI He is here for a physical exam.   He has cut back on sodas and what he eats and has lost weight.  He is not exercising.  He has two soft ares on his right middle finger.    Medications and allergies reviewed with patient and updated if appropriate.  Patient Active Problem List   Diagnosis Date Noted  . Chronic pain of right knee 06/17/2018  . Wears hearing aid 07/04/2016  . 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 Hyperlipidemia 06/29/2016  . 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 Vitamin D deficiency 06/29/2016  . BPH (benign prostatic hyperplasia) 06/29/2016    No current outpatient medications on file prior to visit.   No current facility-administered medications on file prior to visit.     History reviewed. No pertinent past medical history.  Past Surgical History:  Procedure Laterality Date  . CHOLECYSTECTOMY    . INGUINAL HERNIA REPAIR Right 01/24/2017   Procedure: LAPAROSCOPIC RIGHT INGUINAL HERNIA WITH MESH;  Surgeon: Ralene Ok, MD;  Location: [facility];  Service: General;  Laterality: Right;  . INSERTION OF MESH Right 01/24/2017   Procedure: INSERTION OF MESH;  Surgeon: Ralene Ok, MD;  Location: [facility];  Service: General;  Laterality: Right;    Social History   Socioeconomic History  . Marital status: Married    Spouse name: Not on file  . Number of children: Not on file  . Years of education: Not on file  . Highest education level: Not on file  Occupational History  . Not on file  Social Needs  . Financial resource strain: Not on file  . Food insecurity    Worry: Not on file    Inability: Not on file  . Transportation needs    Medical: Not on file    Non-medical: Not on file  Tobacco Use  . Smoking status: Former Research scientist (life sciences)  . Smokeless tobacco: Never Used  Substance and Sexual Activity  . Alcohol use: No  . Drug use: No  . Sexual activity:  Not on file  Lifestyle  . Physical activity    Days per week: Not on file    Minutes per session: Not on file  . Stress: Not on file  Relationships  . Social Herbalist on phone: Not on file    Gets together: Not on file    Attends religious service: Not on file    Active member of club or organization: Not on file    Attends meetings of clubs or organizations: Not on file    Relationship status: Not on file  Other Topics Concern  . Not on file  Social History Narrative  . Not on file    Family History  Problem Relation Age of Onset  . Arthritis Mother   . Heart disease Father   . Arthritis Father   . Diabetes Maternal Aunt   . Cancer Paternal Uncle        lung  . Stroke Maternal Grandmother   . Diabetes Maternal Grandmother     Review of Systems  Constitutional: Negative for chills and fever.  Eyes: Negative for visual disturbance.  Respiratory: Negative for cough, shortness of breath and wheezing.   Cardiovascular: Negative for chest pain, palpitations and leg swelling.  Gastrointestinal: Negative for abdominal pain, constipation, diarrhea and nausea.  Genitourinary: Negative for difficulty urinating,  dysuria and hematuria.  Musculoskeletal: Positive for arthralgias (knees).  Skin: Negative for color change and rash.  Neurological: Negative for dizziness and headaches.  Psychiatric/Behavioral: Negative for dysphoric mood. The patient is not nervous/anxious.        Objective:   Vitals:   02/09/19 0847  BP: (!) 148/82  Pulse: 68  Resp: 16  Temp: 97.8 F (36.6 C)  SpO2: 96%   Filed Weights   02/09/19 0847  Weight: 202 lb (91.6 kg)   Body mass index is 28.17 kg/m.  BP Readings from Last 3 Encounters:  02/09/19 (!) 148/82  06/17/18 (!) 152/90  02/02/18 132/76   Wt Readings from Last 3 Encounters:  02/09/19 202 lb (91.6 kg)  06/17/18 218 lb 12.8 oz (99.2 kg)  02/02/18 213 lb (96.6 kg)     Physical Exam Constitutional: He appears  well-developed and well-nourished. No distress.  HENT:  Head: Normocephalic and atraumatic.  Right Ear: External ear normal.  Left Ear: External ear normal.  Mouth/Throat: Oropharynx is clear and moist.  Normal ear canals and TM b/l  Eyes: Conjunctivae and EOM are normal.  Neck: Neck supple. No tracheal deviation present. No thyromegaly present.  No carotid bruit  Cardiovascular: Normal rate, regular rhythm, normal heart sounds and intact distal pulses.   No murmur heard. Pulmonary/Chest: Effort normal and breath sounds normal. No respiratory distress. He has no wheezes. He has no rales.  Abdominal: Soft. He exhibits no distension. There is no tenderness.  Genitourinary: deferred  Musculoskeletal: He exhibits no edema.  Two small cysts irght posterior middle finger between DIP and fingernail - nail deformity at base Lymphadenopathy:   He has no cervical adenopathy.  Skin: Skin is warm and dry. He is not diaphoretic.  Psychiatric: He has a normal mood and affect. His behavior is normal.         Assessment & Plan:   Physical exam: Screening blood work  ordered Immunizations   discussed shingrix, others up to date Colonoscopy  N/a  - no longer needed due to age Eye exams   Up to date Exercise  none Weight  Has lost weight - encouraged him to continue weight loss efforts Skin    No concerns Substance abuse   none  See Problem List for Assessment and Plan of chronic medical problems.

## 2019-02-09 ENCOUNTER — Ambulatory Visit (INDEPENDENT_AMBULATORY_CARE_PROVIDER_SITE_OTHER): Payer: Medicare Other | Admitting: Internal Medicine

## 2019-02-09 ENCOUNTER — Encounter: Payer: Self-pay | Admitting: Internal Medicine

## 2019-02-09 ENCOUNTER — Other Ambulatory Visit: Payer: Self-pay

## 2019-02-09 VITALS — BP 148/82 | HR 68 | Temp 97.8°F | Resp 16 | Ht 71.0 in | Wt 202.0 lb

## 2019-02-09 DIAGNOSIS — M67449 Ganglion, unspecified hand: Secondary | ICD-10-CM | POA: Diagnosis not present

## 2019-02-09 DIAGNOSIS — Z0000 Encounter for general adult medical examination without abnormal findings: Secondary | ICD-10-CM

## 2019-02-09 DIAGNOSIS — E782 Mixed hyperlipidemia: Secondary | ICD-10-CM

## 2019-02-09 NOTE — Assessment & Plan Note (Signed)
Two cysts right middle finger with nail deformity Deferred referral to hand ortho monitor

## 2019-02-09 NOTE — Assessment & Plan Note (Signed)
Check lipid panel  Diet controlled Regular exercise and healthy diet encouraged  

## 2019-02-11 ENCOUNTER — Ambulatory Visit: Payer: Self-pay

## 2019-02-11 MED ORDER — PREDNISONE 10 MG PO TABS: ORAL_TABLET | ORAL | 0 refills | Status: DC

## 2019-02-11 NOTE — Telephone Encounter (Signed)
Pts wife is aware 

## 2019-02-11 NOTE — Telephone Encounter (Signed)
Would you mind seeing patient? If so would you do a virtual or prefer in office?

## 2019-02-11 NOTE — Telephone Encounter (Signed)
Patient does not want to be seen since he was just here for an appointment. Would like something called in and does not want to see Jenny Reichmann since he has never seen him.

## 2019-02-11 NOTE — Telephone Encounter (Signed)
Would you like to try to do a virtual?

## 2019-02-11 NOTE — Telephone Encounter (Signed)
Pt's wife calling, pt present during call. Reports poison oak rash, onset Tuesday evening. States rash is red, bumpy, "A few have blistered." States interfering with sleep. Have tried calamine lotion, ineffective. States was at practice Tuesday am for physical.   Requesting mdication be called in for itching, rash. If appropriate, [facility]  At [address] and [address]  CB# [phone]  Reason for Disposition . SEVERE itching (e.g., interferes with sleep or normal activities)  Answer Assessment - Initial Assessment Questions 1. APPEARANCE of RASH: "Describe the rash."      REd, bumpy and some are blistery 2. LOCATION: "Where is the rash located?"      Both arms and back of neck 3. SIZE: "How large is the rash?"      Arms are covered from elbows down 4. ONSET: "When did the rash begin?"      Tuesday evening 5. ITCHING: "Does the rash itch?" If so, ask: "How bad is it?"   - MILD - doesn't interfere with normal activities   - MODERATE - SEVERE: interferes with work, school, sleep, or other activities     Moderate, can't sleep with itching. Calamine, ineffective  Protocols used: POISON IVY - OAK - SUMAC-A-AH

## 2019-02-11 NOTE — Telephone Encounter (Signed)
Called patient to schedule a virtual visit with Dr Jenny Reichmann.  Patient would prefer not to have an appointment.  He states that he has never seen Dr Jenny Reichmann and was just in the office this week for his appointment with Dr Quay Burow.  Please advise.

## 2019-02-11 NOTE — Telephone Encounter (Signed)
I think dr Jenny Reichmann would be willing se do a virtual

## 2019-02-11 NOTE — Telephone Encounter (Signed)
Message from Lionel December sent at 02/11/2019 8:19 AM EDT  Summary: Poison Oak    Patient's wife is calling because her husband is broke out and itching due to poison oak. He is unable to rest because of the itching. He would like something called in if possible. They would like a call back please

## 2019-02-11 NOTE — Telephone Encounter (Signed)
[place] for virtual, thanks

## 2019-02-11 NOTE — Addendum Note (Signed)
Addended by: Binnie Rail on: 02/11/2019 12:58 PM   Modules accepted: Orders

## 2019-02-11 NOTE — Telephone Encounter (Signed)
Would you mind setting up virtual with Joe Robertson please.

## 2019-02-11 NOTE — Telephone Encounter (Signed)
Prednisone taper sent to [facility]

## 2019-04-01 ENCOUNTER — Other Ambulatory Visit: Payer: Self-pay | Admitting: Internal Medicine

## 2019-04-01 MED ORDER — CLOBETASOL PROPIONATE 0.05 % EX OINT: 1.0000 "application " | TOPICAL_OINTMENT | Freq: Two times a day (BID) | CUTANEOUS | 0 refills | Status: DC

## 2019-04-01 NOTE — Telephone Encounter (Signed)
Notified pt w/MD response. Sent cream to [facility].Marland KitchenJohny Robertson

## 2019-04-01 NOTE — Telephone Encounter (Signed)
Medication Refill - Medication:  predniSONE (DELTASONE) 10 MG tablet  Has the patient contacted their pharmacy? Yes advised to call office. Patient states he is covered in mosquito bites and is wanting this medication for some relief.   Preferred Pharmacy (with phone number or street name):  [facility] [place], [address] [phone] (Phone) [phone] (Fax)   Agent: Please be advised that RX refills may take up to 3 business days. We ask that you follow-up with your pharmacy.

## 2019-04-01 NOTE — Telephone Encounter (Signed)
I would ideally recommend a steroid cream to avoid the side effects of the oral steroids if possible - if he is ok with trying a cream - one is pending.

## 2019-04-12 ENCOUNTER — Other Ambulatory Visit: Payer: Self-pay

## 2019-04-12 ENCOUNTER — Ambulatory Visit (INDEPENDENT_AMBULATORY_CARE_PROVIDER_SITE_OTHER): Payer: Medicare Other | Admitting: Internal Medicine

## 2019-04-12 ENCOUNTER — Encounter: Payer: Self-pay | Admitting: Internal Medicine

## 2019-04-12 VITALS — BP 124/72 | HR 101 | Temp 99.1°F | Resp 18 | Ht 71.0 in | Wt 218.0 lb

## 2019-04-12 DIAGNOSIS — Z23 Encounter for immunization: Secondary | ICD-10-CM

## 2019-04-12 DIAGNOSIS — H60391 Other infective otitis externa, right ear: Secondary | ICD-10-CM

## 2019-04-12 DIAGNOSIS — H6690 Otitis media, unspecified, unspecified ear: Secondary | ICD-10-CM | POA: Diagnosis not present

## 2019-04-12 DIAGNOSIS — H6090 Unspecified otitis externa, unspecified ear: Secondary | ICD-10-CM | POA: Insufficient documentation

## 2019-04-12 MED ORDER — NEOMYCIN-POLYMYXIN-HC 1 % OT SOLN: 3.0000 [drp] | Freq: Four times a day (QID) | OTIC | 0 refills | Status: DC

## 2019-04-12 MED ORDER — AMOXICILLIN-POT CLAVULANATE 875-125 MG PO TABS: 1.0000 | ORAL_TABLET | Freq: Two times a day (BID) | ORAL | 0 refills | Status: DC

## 2019-04-12 NOTE — Assessment & Plan Note (Signed)
Concern for possible otitis media-tympanic membrane is not well visualized In addition to giving Cortisporin eardrops will also prescribe Augmentin twice daily for 1 week Advised that if his symptoms do not improve he should call or return

## 2019-04-12 NOTE — Progress Notes (Signed)
Subjective:    Patient ID: Joe Blalock, MD, male    DOB: 10/30/1940, 78 y.o.   MRN: [id number]  HPI The patient is here for an acute visit possible ear infection.  His symptoms started last week.   He is experiencing right ear pain.  He does wear hearing aid and when he tries to put that in the right ear it does feel like the canal is swollen and it increases the pain.  He denies any discharge.  He denies any major changes in his hearing.  He has not had any fevers, chills, nasal congestion, sinus pain, sore throat, cough, wheeze, shortness of breath, headaches or dizziness.  He has not tried taking anything.    Medications and allergies reviewed with patient and updated if appropriate.  Patient Active Problem List   Diagnosis Date Noted  . Digital mucinous cyst of finger 02/09/2019  . Chronic pain of right knee 06/17/2018  . Wears hearing aid 07/04/2016  . 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 Hyperlipidemia 06/29/2016  . 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 Vitamin D deficiency 06/29/2016  . BPH (benign prostatic hyperplasia) 06/29/2016    No current outpatient medications on file prior to visit.   No current facility-administered medications on file prior to visit.     No past medical history on file.  Past Surgical History:  Procedure Laterality Date  . CHOLECYSTECTOMY    . INGUINAL HERNIA REPAIR Right 01/24/2017   Procedure: LAPAROSCOPIC RIGHT INGUINAL HERNIA WITH MESH;  Surgeon: Ralene Ok, MD;  Location: [facility];  Service: General;  Laterality: Right;  . INSERTION OF MESH Right 01/24/2017   Procedure: INSERTION OF MESH;  Surgeon: Ralene Ok, MD;  Location: [facility];  Service: General;  Laterality: Right;    Social History   Socioeconomic History  . Marital status: Married    Spouse name: Not on file  . Number of children: Not on file  . Years of education: Not on file  . Highest education level: Not on file  Occupational History  . Not on  file  Social Needs  . Financial resource strain: Not on file  . Food insecurity    Worry: Not on file    Inability: Not on file  . Transportation needs    Medical: Not on file    Non-medical: Not on file  Tobacco Use  . Smoking status: Former Research scientist (life sciences)  . Smokeless tobacco: Never Used  Substance and Sexual Activity  . Alcohol use: No  . Drug use: No  . Sexual activity: Not on file  Lifestyle  . Physical activity    Days per week: Not on file    Minutes per session: Not on file  . Stress: Not on file  Relationships  . Social Herbalist on phone: Not on file    Gets together: Not on file    Attends religious service: Not on file    Active member of club or organization: Not on file    Attends meetings of clubs or organizations: Not on file    Relationship status: Not on file  Other Topics Concern  . Not on file  Social History Narrative  . Not on file    Family History  Problem Relation Age of Onset  . Arthritis Mother   . Heart disease Father   . Arthritis Father   . Diabetes Maternal Aunt   . Cancer Paternal Uncle        lung  .  Stroke Maternal Grandmother   . Diabetes Maternal Grandmother     Review of Systems  Constitutional: Negative for chills and fever.  HENT: Positive for ear pain (ear canal). Negative for congestion, sinus pain and sore throat.   Respiratory: Negative for cough, shortness of breath and wheezing.   Neurological: Negative for dizziness and headaches.       Objective:   Vitals:   04/12/19 1450  BP: 124/72  Pulse: (!) 101  Resp: 18  Temp: 99.1 F (37.3 C)  SpO2: 96%   BP Readings from Last 3 Encounters:  04/12/19 124/72  02/09/19 (!) 148/82  06/17/18 (!) 152/90   Wt Readings from Last 3 Encounters:  04/12/19 218 lb (98.9 kg)  02/09/19 202 lb (91.6 kg)  06/17/18 218 lb 12.8 oz (99.2 kg)   Body mass index is 30.4 kg/m.   Physical Exam    GENERAL APPEARANCE: Appears stated age, well appearing, NAD EYES:  conjunctiva clear, no icterus HEENT: Left ear canal and tympanic membrane normal, right ear canal with mild swelling, erythema and exudate in canal, tympanic membrane not well visualized,, oropharynx with no erythema, no thyromegaly, trachea midline, no cervical or supraclavicular lymphadenopathy SKIN: Warm, dry      Assessment & Plan:    See Problem List for Assessment and Plan of chronic medical problems.

## 2019-04-12 NOTE — Assessment & Plan Note (Signed)
Right otitis externa Start Cortisporin eardrops to right ear Call if symptoms do not improve

## 2019-04-12 NOTE — Patient Instructions (Signed)
Complete both the oral and topical antibiotics for your right ear infection.    Please call if there is no improvement in your symptoms.

## 2019-05-04 DIAGNOSIS — Z0120 Encounter for dental examination and cleaning without abnormal findings: Secondary | ICD-10-CM | POA: Diagnosis not present

## 2019-05-10 ENCOUNTER — Telehealth: Payer: Self-pay

## 2019-05-10 NOTE — Telephone Encounter (Signed)
Joe Robertson 05/10/2019 09:53 AM  Spoke with wife, He does not want to come into office at this time, &They do not want to do a virtual visit. He is going to continuing using the cream he had before. He will call back if it is not better.

## 2019-05-10 NOTE — Telephone Encounter (Signed)
Copied from [place] [id number]. Topic: Appointment Scheduling - Scheduling Inquiry for Clinic >> May 10, 2019  9:39 AM Reyne Dumas L wrote: Reason for CRM:   Pt's wife calling.  States that pt has poison oak or ivy all over arms and would like to be seen to get a prednisone script today if possible.  Called office, no answer.

## 2019-05-11 ENCOUNTER — Encounter: Payer: Self-pay | Admitting: Internal Medicine

## 2019-05-11 ENCOUNTER — Ambulatory Visit (INDEPENDENT_AMBULATORY_CARE_PROVIDER_SITE_OTHER): Payer: Medicare Other | Admitting: Internal Medicine

## 2019-05-11 ENCOUNTER — Other Ambulatory Visit: Payer: Self-pay

## 2019-05-11 VITALS — BP 142/78 | HR 108 | Temp 98.5°F | Resp 16 | Ht 71.0 in | Wt 202.0 lb

## 2019-05-11 DIAGNOSIS — H60391 Other infective otitis externa, right ear: Secondary | ICD-10-CM

## 2019-05-11 MED ORDER — CLOTRIMAZOLE 1 % EX SOLN: 1.0000 "application " | Freq: Two times a day (BID) | CUTANEOUS | 0 refills | Status: DC

## 2019-05-11 NOTE — Progress Notes (Signed)
Subjective:    Patient ID: Joe Blalock, MD, male    DOB: [birth year], 78 y.o.   MRN: [id number]  HPI The patient is here for an acute visit.  He is concerned that he still has a right ear infection.  He did complete the oral antibiotics, but stopped taking the topical antibiotics and he thinks that the infection was not completely treated.  He still has decreased hearing.  He denies any ear pain.  He denies any other cold symptoms, fever or chills.  He is wondering if he should continue the topical ear antibiotic.    Medications and allergies reviewed with patient and updated if appropriate.  Patient Active Problem List   Diagnosis Date Noted  . Otitis media 04/12/2019  . Otitis externa 04/12/2019  . Digital mucinous cyst of finger 02/09/2019  . Chronic pain of right knee 06/17/2018  . Wears hearing aid 07/04/2016  . 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 Hyperlipidemia 06/29/2016  . 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 Vitamin D deficiency 06/29/2016  . BPH (benign prostatic hyperplasia) 06/29/2016    Current Outpatient Medications on File Prior to Visit  Medication Sig Dispense Refill  . NEOMYCIN-POLYMYXIN-HYDROCORTISONE (CORTISPORIN) 1 % SOLN OTIC solution Place 3 drops into the right ear 4 (four) times daily. 10 mL 0   No current facility-administered medications on file prior to visit.     History reviewed. No pertinent past medical history.  Past Surgical History:  Procedure Laterality Date  . CHOLECYSTECTOMY    . INGUINAL HERNIA REPAIR Right 01/24/2017   Procedure: LAPAROSCOPIC RIGHT INGUINAL HERNIA WITH MESH;  Surgeon: Ralene Ok, MD;  Location: [facility];  Service: General;  Laterality: Right;  . INSERTION OF MESH Right 01/24/2017   Procedure: INSERTION OF MESH;  Surgeon: Ralene Ok, MD;  Location: [facility];  Service: General;  Laterality: Right;    Social History   Socioeconomic History  . Marital status: Married    Spouse name: Not on file  .  Number of children: Not on file  . Years of education: Not on file  . Highest education level: Not on file  Occupational History  . Not on file  Social Needs  . Financial resource strain: Not on file  . Food insecurity    Worry: Not on file    Inability: Not on file  . Transportation needs    Medical: Not on file    Non-medical: Not on file  Tobacco Use  . Smoking status: Former Research scientist (life sciences)  . Smokeless tobacco: Never Used  Substance and Sexual Activity  . Alcohol use: No  . Drug use: No  . Sexual activity: Not on file  Lifestyle  . Physical activity    Days per week: Not on file    Minutes per session: Not on file  . Stress: Not on file  Relationships  . Social Herbalist on phone: Not on file    Gets together: Not on file    Attends religious service: Not on file    Active member of club or organization: Not on file    Attends meetings of clubs or organizations: Not on file    Relationship status: Not on file  Other Topics Concern  . Not on file  Social History Narrative  . Not on file    Family History  Problem Relation Age of Onset  . Arthritis Mother   . Heart disease Father   . Arthritis Father   . Diabetes Maternal  Aunt   . Cancer Paternal Uncle        lung  . Stroke Maternal Grandmother   . Diabetes Maternal Grandmother     Review of Systems  Constitutional: Negative for chills and fever.  HENT: Positive for hearing loss. Negative for congestion, ear discharge, ear pain, sinus pain and sore throat.   Neurological: Negative for dizziness and headaches.       Objective:   Vitals:   05/11/19 0921  BP: (!) 142/78  Pulse: (!) 108  Resp: 16  Temp: 98.5 F (36.9 C)  SpO2: 98%   BP Readings from Last 3 Encounters:  05/11/19 (!) 142/78  04/12/19 124/72  02/09/19 (!) 148/82   Wt Readings from Last 3 Encounters:  05/11/19 202 lb (91.6 kg)  04/12/19 218 lb (98.9 kg)  02/09/19 202 lb (91.6 kg)   Body mass index is 28.17 kg/m.    Physical Exam Constitutional:      General: He is not in acute distress.    Appearance: Normal appearance. He is not ill-appearing.  HENT:     Head: Normocephalic and atraumatic.     Right Ear: External ear normal. There is no impacted cerumen.     Left Ear: Tympanic membrane, ear canal and external ear normal. There is no impacted cerumen.     Ears:     Comments: Right distal ear canal with white coating and several black dots-unable to visualize TM    Nose: Nose normal.     Mouth/Throat:     Mouth: Mucous membranes are moist.     Pharynx: No posterior oropharyngeal erythema.  Eyes:     Conjunctiva/sclera: Conjunctivae normal.  Neck:     Musculoskeletal: Neck supple. No neck rigidity or muscular tenderness.  Lymphadenopathy:     Cervical: No cervical adenopathy.  Neurological:     Mental Status: He is alert.            Assessment & Plan:    See Problem List for Assessment and Plan of chronic medical problems.

## 2019-05-11 NOTE — Assessment & Plan Note (Signed)
Appears to be a possible fungal infection,?  In addition to bacterial infection He does have some antibacterial eardrops and he will use those in addition to clotrimazole 1% solution twice daily He does have someone that can look in his ears, but if his symptoms have not completely resolved advised that he needs to come back to have this evaluated to make sure it has completely resolved

## 2019-05-11 NOTE — Patient Instructions (Signed)
Use the antifungal ointment for 10-14 days.  Use the antibacterial drops.

## 2019-07-18 NOTE — Progress Notes (Signed)
Subjective:    Patient ID: Joe Blalock, MD, male    DOB: 10-Aug-1941, 78 y.o.   MRN: [id number]  HPI The patient is here for an acute visit.   Right knee pain:  He is still experiencing right knee pain.  He has known arthritis.  He also has left groin pain.  The pain typically radiates from the knee, into the thigh and into the hip.  He is now using a cane to ambulate.  He has difficult ambulating.  He has fallen a couple of times.  He denies hip or knee swelling.  He denies N/T.  He has no pain at rest.     Medications and allergies reviewed with patient and updated if appropriate.  Patient Active Problem List   Diagnosis Date Noted  . Otitis media 04/12/2019  . Otitis externa 04/12/2019  . Digital mucinous cyst of finger 02/09/2019  . Chronic pain of right knee 06/17/2018  . Wears hearing aid 07/04/2016  . 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 Hyperlipidemia 06/29/2016  . 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 Vitamin D deficiency 06/29/2016  . BPH (benign prostatic hyperplasia) 06/29/2016    Current Outpatient Medications on File Prior to Visit  Medication Sig Dispense Refill  . clotrimazole (LOTRIMIN) 1 % external solution Apply 1 application topically 2 (two) times daily. Use for 10 - 14 days in right ear 30 mL 0  . NEOMYCIN-POLYMYXIN-HYDROCORTISONE (CORTISPORIN) 1 % SOLN OTIC solution Place 3 drops into the right ear 4 (four) times daily. 10 mL 0   No current facility-administered medications on file prior to visit.     No past medical history on file.  Past Surgical History:  Procedure Laterality Date  . CHOLECYSTECTOMY    . INGUINAL HERNIA REPAIR Right 01/24/2017   Procedure: LAPAROSCOPIC RIGHT INGUINAL HERNIA WITH MESH;  Surgeon: Ralene Ok, MD;  Location: [facility];  Service: General;  Laterality: Right;  . INSERTION OF MESH Right 01/24/2017   Procedure: INSERTION OF MESH;  Surgeon: Ralene Ok, MD;  Location: [facility];  Service: General;  Laterality:  Right;    Social History   Socioeconomic History  . Marital status: Married    Spouse name: Not on file  . Number of children: Not on file  . Years of education: Not on file  . Highest education level: Not on file  Occupational History  . Not on file  Social Needs  . Financial resource strain: Not on file  . Food insecurity    Worry: Not on file    Inability: Not on file  . Transportation needs    Medical: Not on file    Non-medical: Not on file  Tobacco Use  . Smoking status: Former Research scientist (life sciences)  . Smokeless tobacco: Never Used  Substance and Sexual Activity  . Alcohol use: No  . Drug use: No  . Sexual activity: Not on file  Lifestyle  . Physical activity    Days per week: Not on file    Minutes per session: Not on file  . Stress: Not on file  Relationships  . Social Herbalist on phone: Not on file    Gets together: Not on file    Attends religious service: Not on file    Active member of club or organization: Not on file    Attends meetings of clubs or organizations: Not on file    Relationship status: Not on file  Other Topics Concern  . Not on file  Social History Narrative  . Not on file    Family History  Problem Relation Age of Onset  . Arthritis Mother   . Heart disease Father   . Arthritis Father   . Diabetes Maternal Aunt   . Cancer Paternal Uncle        lung  . Stroke Maternal Grandmother   . Diabetes Maternal Grandmother     Review of Systems     Objective:  There were no vitals filed for this visit. BP Readings from Last 3 Encounters:  05/11/19 (!) 142/78  04/12/19 124/72  02/09/19 (!) 148/82   Wt Readings from Last 3 Encounters:  05/11/19 202 lb (91.6 kg)  04/12/19 218 lb (98.9 kg)  02/09/19 202 lb (91.6 kg)   There is no height or weight on file to calculate BMI.   Physical Exam    A right knee and hip exam was performed.   SKIN: intact, small mostly healed abrasion on anterior knee    SWELLING: no EFFUSION: no  WARMTH: no warmth  TENDERNESS: no tenderness with palpation of hip or knee  ROM: full extension, full flexion  GAIT: walks with a limp - unable to fully bear weight on right leg  NEUROLOGICAL EXAM: normal sensation  CALF TENDERNESS: no        Assessment & Plan:    See Problem List for Assessment and Plan of chronic medical problems.    This visit occurred during the SARS-CoV-2 public health emergency.  Safety protocols were in place, including screening questions prior to the visit, additional usage of staff PPE, and extensive cleaning of exam room while observing appropriate contact time as indicated for disinfecting solutions.

## 2019-07-19 ENCOUNTER — Ambulatory Visit (INDEPENDENT_AMBULATORY_CARE_PROVIDER_SITE_OTHER): Payer: Medicare Other | Admitting: Internal Medicine

## 2019-07-19 ENCOUNTER — Other Ambulatory Visit: Payer: Self-pay

## 2019-07-19 ENCOUNTER — Encounter: Payer: Self-pay | Admitting: Internal Medicine

## 2019-07-19 VITALS — BP 144/92 | HR 93 | Temp 97.8°F | Resp 16 | Ht 71.0 in | Wt 202.0 lb

## 2019-07-19 DIAGNOSIS — M8949 Other hypertrophic osteoarthropathy, multiple sites: Secondary | ICD-10-CM | POA: Diagnosis not present

## 2019-07-19 DIAGNOSIS — M159 Polyosteoarthritis, unspecified: Secondary | ICD-10-CM

## 2019-07-19 NOTE — Patient Instructions (Addendum)
A referral was ordered for [facility].  They will call you to schedule an appointment.     Take Tylenol if needed for your pain.  You can take up to 3000 mg of Tylenol in one day.     Continue to use the cane to ambulate.

## 2019-07-19 NOTE — Assessment & Plan Note (Addendum)
Having right knee and right groin pain - ? Hip or knee OA Will refer to ortho for further evaluation and treatment Advised tylenol for pain Continue cane

## 2019-07-21 ENCOUNTER — Ambulatory Visit: Payer: Self-pay

## 2019-07-21 ENCOUNTER — Ambulatory Visit: Payer: Medicare Other | Admitting: Orthopedic Surgery

## 2019-07-21 ENCOUNTER — Encounter: Payer: Self-pay | Admitting: Orthopedic Surgery

## 2019-07-21 ENCOUNTER — Other Ambulatory Visit: Payer: Self-pay

## 2019-07-21 DIAGNOSIS — M79604 Pain in right leg: Secondary | ICD-10-CM

## 2019-07-21 DIAGNOSIS — M1611 Unilateral primary osteoarthritis, right hip: Secondary | ICD-10-CM | POA: Diagnosis not present

## 2019-07-21 NOTE — Progress Notes (Signed)
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  Did have a fall from stairs which impacted the right leg.  He has been taking some Tylenol which is making the pain significantly improved.  Will occasionally wake him from sleep.  Denies any back pain or radicular symptoms.  He has been using a cane in the right hand to assist with weightbearing.  Denies any swelling in the knee at the time of the injury.  He does report pain in the anterior thigh as well as some pain in the calf region.              ROS: All systems reviewed are negative as they relate to the chief complaint within the history of present illness.  Patient denies  fevers or chills.   Assessment & Plan: Visit Diagnoses:  1. Pain in right leg   2. Arthritis of right hip     Plan: Impression is right hip arthritis with reasonable knee exam and no radicular symptoms.  He does have fairly moderate to severe arthritis in the right hip and mild arthritis in the left hip.  I think this is the source of his pain.  Discussed that the next step for him would be diagnostic and therapeutic intra-articular cortisone injection under fluoroscopic or ultrasound guidance.  For now he is very happy with the pain relief he has achieved with Tylenol.  He does not have much of a Trendelenburg gait and his range of motion is actually not significantly affected by the arthritis.  We will see him back when he wants to get that hip injected to try to press the reset button on this injury.  I think he was likely living with a fair amount of arthritis in the hip that was asymptomatic but the  injury exacerbated it enough that it has become symptomatic.  Follow-Up Instructions: Return if symptoms worsen or fail to improve.   Orders:  Orders Placed This Encounter  Procedures   XR Lumbar Spine 2-3 Views   XR HIP UNILAT W OR W/O PELVIS 2-3 VIEWS RIGHT   XR Knee 1-2 Views Right   No orders of the defined types were placed in this encounter.     Procedures: No procedures performed   Clinical Data: No additional findings.  Objective: Vital Signs: There were no vitals taken for this visit.  Physical Exam:   Constitutional: Patient appears well-developed HEENT:  Head: Normocephalic Eyes:EOM are normal Neck: Normal range of motion Cardiovascular: Normal rate Pulmonary/chest: Effort normal Neurologic: Patient is alert Skin: Skin is warm Psychiatric: Patient has normal mood and affect    Ortho Exam: Ortho exam demonstrates fairly normal gait and alignment.  Not much in the way of groin pain with internal X rotation of either leg.  Has no right knee effusion stable collateral and cruciate ligaments on the right-hand side with no focal medial or lateral joint line tenderness.  No effusion in the right knee.  Pedal pulses palpable.  Not much pain with forward  lateral bending.  No definite paresthesias L1 S1 bilaterally.  Specialty Comments:  No specialty comments available.  Imaging: Xr Hip Unilat W Or W/o Pelvis 2-3 Views Right  Result Date: 07/21/2019 AP pelvis lateral right hip reviewed.  Significant arthritis with some femoral head deformity is present.  Bone-on-bone changes noted in the lateral aspect of the acetabulum.  No acute fracture.  Xr Knee 1-2 Views Right  Result Date: 07/21/2019 AP lateral right knee reviewed.  Joint spaces relatively well-maintained with no spurring.  No acute fracture.  Overall alignment and bone quality appears good.  Xr Lumbar Spine 2-3 Views  Result Date: 07/21/2019 AP lateral lumbar spine reviewed.  Mild degenerative  changes are noted between the vertebral bodies as well as in the facet joints primarily in the lower lumbar spine.  No spinal listhesis or compression fractures.    PMFS History: Patient Active Problem List   Diagnosis Date Noted   Otitis media 04/12/2019   Otitis externa 04/12/2019   Digital mucinous cyst of finger 02/09/2019   Chronic pain of right knee 06/17/2018   Wears hearing aid 07/04/2016   History of nephrolithiasis 07/03/2016   Hyperlipidemia 06/29/2016   Primary osteoarthritis 06/29/2016   Vitamin D deficiency 06/29/2016   BPH (benign prostatic hyperplasia) 06/29/2016   History reviewed. No pertinent past medical history.  Family History  Problem Relation Age of Onset   Arthritis Mother    Heart disease Father    Arthritis Father    Diabetes Maternal Aunt    Cancer Paternal Uncle        lung   Stroke Maternal Grandmother    Diabetes Maternal Grandmother     Past Surgical History:  Procedure Laterality Date   CHOLECYSTECTOMY     INGUINAL HERNIA REPAIR Right 01/24/2017   Procedure: LAPAROSCOPIC RIGHT INGUINAL HERNIA WITH MESH;  Surgeon: Ralene Ok, MD;  Location: [facility];  Service: General;  Laterality: Right;   INSERTION OF MESH Right 01/24/2017   Procedure: INSERTION OF MESH;  Surgeon: Ralene Ok, MD;  Location: [facility];  Service: General;  Laterality: Right;   Social History   Occupational History   Not on file  Tobacco Use   Smoking status: Former Smoker   Smokeless tobacco: Never Used  Substance and Sexual Activity   Alcohol use: No   Drug use: No   Sexual activity: Not on file

## 2019-07-22 ENCOUNTER — Encounter: Payer: Self-pay | Admitting: Internal Medicine

## 2019-07-22 DIAGNOSIS — M1611 Unilateral primary osteoarthritis, right hip: Secondary | ICD-10-CM | POA: Insufficient documentation

## 2019-09-14 ENCOUNTER — Other Ambulatory Visit: Payer: Self-pay

## 2019-09-14 ENCOUNTER — Ambulatory Visit (INDEPENDENT_AMBULATORY_CARE_PROVIDER_SITE_OTHER): Payer: Medicare Other | Admitting: Otolaryngology

## 2019-09-14 VITALS — Temp 97.9°F

## 2019-09-14 DIAGNOSIS — H60311 Diffuse otitis externa, right ear: Secondary | ICD-10-CM | POA: Diagnosis not present

## 2019-09-14 DIAGNOSIS — H903 Sensorineural hearing loss, bilateral: Secondary | ICD-10-CM | POA: Diagnosis not present

## 2019-09-14 NOTE — Progress Notes (Signed)
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  He wears bilateral hearing aids.  He has had some discomfort over the past few days in the right ear.  He is also noticed decreased hearing in the right ear..  No past medical history on file. Past Surgical History:  Procedure Laterality Date  . CHOLECYSTECTOMY    . INGUINAL HERNIA REPAIR Right 01/24/2017   Procedure: LAPAROSCOPIC RIGHT INGUINAL HERNIA WITH MESH;  Surgeon: Ralene Ok, MD;  Location: [place];  Service: General;  Laterality: Right;  . INSERTION OF MESH Right 01/24/2017   Procedure: INSERTION OF MESH;  Surgeon: Ralene Ok, MD;  Location: [place];  Service: General;  Laterality: Right;   Social History   Socioeconomic History  . Marital status: Married    Spouse name: Not on file  . Number of children: Not on file  . Years of education: Not on file  . Highest education level: Not on file  Occupational History  . Not on file  Tobacco Use  . Smoking status: Former Research scientist (life sciences)  . Smokeless tobacco: Never Used  Substance and Sexual Activity  . Alcohol use: No  . Drug use: No  . Sexual activity: Not on file  Other Topics Concern  . Not on file  Social History Narrative  . Not on file   Social Determinants of Health   Financial Resource Strain:   . Difficulty of Paying Living Expenses: Not on file  Food Insecurity:   . Worried About Charity fundraiser in the Last Year: Not on file  . Ran Out of Food in the Last Year: Not on file  Transportation Needs:   . Lack of Transportation (Medical): Not on file  . Lack of Transportation (Non-Medical): Not on file  Physical Activity:   . Days of Exercise per Week: Not on file  . Minutes of Exercise per Session: Not on file  Stress:   . Feeling of Stress : Not on file  Social Connections:   . Frequency of Communication with Friends  and Family: Not on file  . Frequency of Social Gatherings with Friends and Family: Not on file  . Attends Religious Services: Not on file  . Active Member of Clubs or Organizations: Not on file  . Attends Archivist Meetings: Not on file  . Marital Status: Not on file   Family History  Problem Relation Age of Onset  . Arthritis Mother   . Heart disease Father   . Arthritis Father   . Diabetes Maternal Aunt   . Cancer Paternal Uncle        lung  . Stroke Maternal Grandmother   . Diabetes Maternal Grandmother    Allergies  Allergen Reactions  . Simvastatin Other (See Comments)    Muscle pain  . Lipitor [Atorvastatin]     Muscle aches   Prior to Admission medications   Medication Sig Start Date End Date Taking? Authorizing Provider  clotrimazole (LOTRIMIN) 1 % external solution Apply 1 application topically 2 (two) times daily. Use for 10 - 14 days in right ear 05/11/19  Yes Burns, Claudina Lick, MD  NEOMYCIN-POLYMYXIN-HYDROCORTISONE (CORTISPORIN) 1 % SOLN OTIC solution Place 3 drops into the right ear 4 (four) times daily. 04/12/19  Yes Burns, Claudina Lick, MD     Positive ROS: Otherwise negative  All  other systems have been reviewed and were otherwise negative with the exception of those mentioned in the HPI and as above.  Physical Exam: Constitutional: Alert, well-appearing, no acute distress Ears: External ears without lesions or tenderness.  Left ear canal and TM are clear.  Right ear canal is swollen and diffusely obstructed with what appears to be more of a fungal external otitis.  This was cleaned using suction and hydrogen peroxide.  Inflammation is more lateral than medial.  The TM is clear and intact.  After cleaning the ear canal I applied gentian violet and CSF powder to the right ear only. Nasal: External nose without lesions. Septum with minimal deformity.. Clear nasal passages Oral: Lips and gums without lesions. Tongue and palate mucosa without lesions. Posterior  oropharynx clear. Neck: No palpable adenopathy or masses Respiratory: Breathing comfortably  Skin: No facial/neck lesions or rash noted.  Cerumen impaction removal  Date/Time: 09/14/2019 5:08 PM Performed by: Rozetta Nunnery, MD Authorized by: Rozetta Nunnery, MD   Consent:    Consent obtained:  Verbal   Consent given by:  Patient   Risks discussed:  Pain and bleeding Procedure details:    Location:  R ear   Procedure type: irrigation and suction   Post-procedure details:    Inspection:  TM intact   Hearing quality:  Improved   Patient tolerance of procedure:  Tolerated well, no immediate complications Comments:     Patient with a right external otitis.  I applied gentian violet and CSF powder to the right ear.    Assessment: Right external otitis Bilateral SNHL  Plan: Recommend keeping the right ear dry for the next 3 to 4 days.  Gave her a prescription for Cortisporin otic suspension drops to use if he develops any further pain in the right ear.  Recommend leaving his hearing aid out for 3 days. He will follow-up as needed.   Radene Journey, MD   CC:

## 2019-10-18 DIAGNOSIS — I639 Cerebral infarction, unspecified: Secondary | ICD-10-CM

## 2019-10-18 HISTORY — DX: Cerebral infarction, unspecified: I63.9

## 2019-10-28 ENCOUNTER — Other Ambulatory Visit: Payer: Self-pay

## 2019-10-28 ENCOUNTER — Ambulatory Visit (INDEPENDENT_AMBULATORY_CARE_PROVIDER_SITE_OTHER): Payer: Medicare Other | Admitting: Otolaryngology

## 2019-10-28 VITALS — Temp 97.5°F

## 2019-10-28 DIAGNOSIS — H6121 Impacted cerumen, right ear: Secondary | ICD-10-CM | POA: Diagnosis not present

## 2019-10-28 DIAGNOSIS — H60311 Diffuse otitis externa, right ear: Secondary | ICD-10-CM | POA: Diagnosis not present

## 2019-10-28 NOTE — Progress Notes (Signed)
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  He wears bilateral hearing aids.  He was last cleaned.  About 3 months ago.  No past medical history on file. Past Surgical History:  Procedure Laterality Date  . CHOLECYSTECTOMY    . INGUINAL HERNIA REPAIR Right 01/24/2017   Procedure: LAPAROSCOPIC RIGHT INGUINAL HERNIA WITH MESH;  Surgeon: Ralene Ok, MD;  Location: [place];  Service: General;  Laterality: Right;  . INSERTION OF MESH Right 01/24/2017   Procedure: INSERTION OF MESH;  Surgeon: Ralene Ok, MD;  Location: [place];  Service: General;  Laterality: Right;   Social History   Socioeconomic History  . Marital status: Married    Spouse name: Not on file  . Number of children: Not on file  . Years of education: Not on file  . Highest education level: Not on file  Occupational History  . Not on file  Tobacco Use  . Smoking status: Former Research scientist (life sciences)  . Smokeless tobacco: Never Used  Substance and Sexual Activity  . Alcohol use: No  . Drug use: No  . Sexual activity: Not on file  Other Topics Concern  . Not on file  Social History Narrative  . Not on file   Social Determinants of Health   Financial Resource Strain:   . Difficulty of Paying Living Expenses:   Food Insecurity:   . Worried About Charity fundraiser in the Last Year:   . Arboriculturist in the Last Year:   Transportation Needs:   . Film/video editor (Medical):   Marland Kitchen Lack of Transportation (Non-Medical):   Physical Activity:   . Days of Exercise per Week:   . Minutes of Exercise per Session:   Stress:   . Feeling of Stress :   Social Connections:   . Frequency of Communication with Friends and Family:   . Frequency of Social Gatherings with Friends and Family:   . Attends Religious Services:   . Active Member of Clubs or Organizations:   . Attends Archivist Meetings:   Marland Kitchen Marital Status:    Family History  Problem Relation Age of  Onset  . Arthritis Mother   . Heart disease Father   . Arthritis Father   . Diabetes Maternal Aunt   . Cancer Paternal Uncle        lung  . Stroke Maternal Grandmother   . Diabetes Maternal Grandmother    Allergies  Allergen Reactions  . Simvastatin Other (See Comments)    Muscle pain  . Lipitor [Atorvastatin]     Muscle aches   Prior to Admission medications   Medication Sig Start Date End Date Taking? Authorizing Provider  clotrimazole (LOTRIMIN) 1 % external solution Apply 1 application topically 2 (two) times daily. Use for 10 - 14 days in right ear 05/11/19  Yes Burns, Claudina Lick, MD  NEOMYCIN-POLYMYXIN-HYDROCORTISONE (CORTISPORIN) 1 % SOLN OTIC solution Place 3 drops into the right ear 4 (four) times daily. 04/12/19  Yes Burns, Claudina Lick, MD     Positive ROS: Otherwise negative  All other systems have been reviewed and were otherwise negative with the exception of those mentioned in the HPI and as above.  Physical Exam: Constitutional: Alert, well-appearing, no acute distress Ears: External ears without lesions or tenderness. Ear canals minimal wax buildup in the left side that was cleaned with a curette.  Large amount of wax obstructing the right ear  canal.  This was cleaned with suction.  He had mild inflammatory changes of the medial portion of the ear canal and I applied gentian violet Ciprodex and CSF powder to the right ear.. Nasal: External nose without lesions. Clear nasal passages Oral: Oropharynx clear. Neck: No palpable adenopathy or masses Respiratory: Breathing comfortably  Skin: No facial/neck lesions or rash noted.  Cerumen impaction removal  Date/Time: 10/28/2019 2:54 PM Performed by: Rozetta Nunnery, MD Authorized by: Rozetta Nunnery, MD   Consent:    Consent obtained:  Verbal   Consent given by:  Patient   Risks discussed:  Pain and bleeding Procedure details:    Location:  L ear and R ear   Procedure type: curette and suction    Post-procedure details:    Inspection:  TM intact and canal normal   Hearing quality:  Improved   Patient tolerance of procedure:  Tolerated well, no immediate complications Comments:     Right ear canal was much more obstructed than the left.  He had mild inflammatory changes on the right side and I applied gentian violet Ciprodex and CSF powder.    Assessment: Cerumen buildup obstructing right ear canal with mild right external otitis  Plan: Recommended leaving the hearing aid out for the next 2 days. I prescribed Cortisporin drops to use if he develops any itching or pain or discomfort in the right ear. He will follow-up as needed.  Radene Journey, MD

## 2019-10-30 ENCOUNTER — Other Ambulatory Visit: Payer: Self-pay

## 2019-10-30 ENCOUNTER — Inpatient Hospital Stay (HOSPITAL_COMMUNITY)
Admission: EM | Admit: 2019-10-30 | Discharge: 2019-11-02 | DRG: 062 | Disposition: A | Discharge status: Home Health | Payer: Medicare Other | Attending: Neurology | Admitting: Neurology

## 2019-10-30 ENCOUNTER — Inpatient Hospital Stay (HOSPITAL_COMMUNITY): Payer: Medicare Other

## 2019-10-30 ENCOUNTER — Emergency Department (HOSPITAL_COMMUNITY): Payer: Medicare Other

## 2019-10-30 ENCOUNTER — Encounter (HOSPITAL_COMMUNITY): Payer: Self-pay | Admitting: Student in an Organized Health Care Education/Training Program

## 2019-10-30 DIAGNOSIS — Z6830 Body mass index (BMI) 30.0-30.9, adult: Secondary | ICD-10-CM

## 2019-10-30 DIAGNOSIS — N400 Enlarged prostate without lower urinary tract symptoms: Secondary | ICD-10-CM | POA: Diagnosis not present

## 2019-10-30 DIAGNOSIS — I6381 Other cerebral infarction due to occlusion or stenosis of small artery: Principal | ICD-10-CM | POA: Diagnosis present

## 2019-10-30 DIAGNOSIS — R29818 Other symptoms and signs involving the nervous system: Secondary | ICD-10-CM | POA: Diagnosis not present

## 2019-10-30 DIAGNOSIS — R29705 NIHSS score 5: Secondary | ICD-10-CM | POA: Diagnosis not present

## 2019-10-30 DIAGNOSIS — Z87891 Personal history of nicotine dependence: Secondary | ICD-10-CM

## 2019-10-30 DIAGNOSIS — E669 Obesity, unspecified: Secondary | ICD-10-CM | POA: Diagnosis not present

## 2019-10-30 DIAGNOSIS — I672 Cerebral atherosclerosis: Secondary | ICD-10-CM | POA: Diagnosis not present

## 2019-10-30 DIAGNOSIS — Z79899 Other long term (current) drug therapy: Secondary | ICD-10-CM

## 2019-10-30 DIAGNOSIS — R471 Dysarthria and anarthria: Secondary | ICD-10-CM | POA: Diagnosis not present

## 2019-10-30 DIAGNOSIS — I69354 Hemiplegia and hemiparesis following cerebral infarction affecting left non-dominant side: Secondary | ICD-10-CM | POA: Diagnosis not present

## 2019-10-30 DIAGNOSIS — I10 Essential (primary) hypertension: Secondary | ICD-10-CM | POA: Diagnosis not present

## 2019-10-30 DIAGNOSIS — Z888 Allergy status to other drugs, medicaments and biological substances status: Secondary | ICD-10-CM | POA: Diagnosis not present

## 2019-10-30 DIAGNOSIS — Z823 Family history of stroke: Secondary | ICD-10-CM

## 2019-10-30 DIAGNOSIS — I7389 Other specified peripheral vascular diseases: Secondary | ICD-10-CM | POA: Diagnosis present

## 2019-10-30 DIAGNOSIS — M1990 Unspecified osteoarthritis, unspecified site: Secondary | ICD-10-CM | POA: Diagnosis not present

## 2019-10-30 DIAGNOSIS — Z8261 Family history of arthritis: Secondary | ICD-10-CM | POA: Diagnosis not present

## 2019-10-30 DIAGNOSIS — R531 Weakness: Secondary | ICD-10-CM | POA: Diagnosis not present

## 2019-10-30 DIAGNOSIS — Z20822 Contact with and (suspected) exposure to covid-19: Secondary | ICD-10-CM | POA: Diagnosis present

## 2019-10-30 DIAGNOSIS — Z9282 Status post administration of tPA (rtPA) in a different facility within the last 24 hours prior to admission to current facility: Secondary | ICD-10-CM | POA: Diagnosis not present

## 2019-10-30 DIAGNOSIS — E782 Mixed hyperlipidemia: Secondary | ICD-10-CM | POA: Diagnosis not present

## 2019-10-30 DIAGNOSIS — E785 Hyperlipidemia, unspecified: Secondary | ICD-10-CM | POA: Diagnosis not present

## 2019-10-30 DIAGNOSIS — I6503 Occlusion and stenosis of bilateral vertebral arteries: Secondary | ICD-10-CM | POA: Diagnosis not present

## 2019-10-30 DIAGNOSIS — Z974 Presence of external hearing-aid: Secondary | ICD-10-CM | POA: Diagnosis not present

## 2019-10-30 DIAGNOSIS — I639 Cerebral infarction, unspecified: Secondary | ICD-10-CM | POA: Diagnosis not present

## 2019-10-30 DIAGNOSIS — Z8673 Personal history of transient ischemic attack (TIA), and cerebral infarction without residual deficits: Secondary | ICD-10-CM | POA: Diagnosis present

## 2019-10-30 DIAGNOSIS — I6389 Other cerebral infarction: Secondary | ICD-10-CM | POA: Diagnosis not present

## 2019-10-30 LAB — CBC
HCT: 52 % (ref 39.0–52.0)
Hemoglobin: 16.7 g/dL (ref 13.0–17.0)
MCH: 30.9 pg (ref 26.0–34.0)
MCHC: 32.1 g/dL (ref 30.0–36.0)
MCV: 96.3 fL (ref 80.0–100.0)
Platelets: 216 10*3/uL (ref 150–400)
RBC: 5.4 MIL/uL (ref 4.22–5.81)
RDW: 13.7 % (ref 11.5–15.5)
WBC: 11.3 10*3/uL — ABNORMAL HIGH (ref 4.0–10.5)
nRBC: 0 % (ref 0.0–0.2)

## 2019-10-30 LAB — COMPREHENSIVE METABOLIC PANEL
ALT: 26 U/L (ref 0–44)
AST: 32 U/L (ref 15–41)
Albumin: 4.2 g/dL (ref 3.5–5.0)
Alkaline Phosphatase: 57 U/L (ref 38–126)
Anion gap: 10 (ref 5–15)
BUN: 18 mg/dL (ref 8–23)
CO2: 23 mmol/L (ref 22–32)
Calcium: 9.1 mg/dL (ref 8.9–10.3)
Chloride: 107 mmol/L (ref 98–111)
Creatinine, Ser: 1.22 mg/dL (ref 0.61–1.24)
GFR calc Af Amer: >60 mL/min (ref >60)
GFR calc non Af Amer: 57 mL/min — ABNORMAL LOW (ref >60)
Glucose, Bld: 104 mg/dL — ABNORMAL HIGH (ref 70–99)
Potassium: 4.7 mmol/L (ref 3.5–5.1)
Sodium: 140 mmol/L (ref 135–145)
Total Bilirubin: 0.5 mg/dL (ref 0.3–1.2)
Total Protein: 7.3 g/dL (ref 6.5–8.1)

## 2019-10-30 LAB — I-STAT CHEM 8, ED
BUN: 20 mg/dL (ref 8–23)
Calcium, Ion: 1.06 mmol/L — ABNORMAL LOW (ref 1.15–1.40)
Chloride: 109 mmol/L (ref 98–111)
Creatinine, Ser: 1.1 mg/dL (ref 0.61–1.24)
Glucose, Bld: 99 mg/dL (ref 70–99)
HCT: 50 % (ref 39.0–52.0)
Hemoglobin: 17 g/dL (ref 13.0–17.0)
Potassium: 3.9 mmol/L (ref 3.5–5.1)
Sodium: 140 mmol/L (ref 135–145)
TCO2: 24 mmol/L (ref 22–32)

## 2019-10-30 LAB — DIFFERENTIAL
Abs Immature Granulocytes: 0.02 10*3/uL (ref 0.00–0.07)
Basophils Absolute: 0 10*3/uL (ref 0.0–0.1)
Basophils Relative: 0 %
Eosinophils Absolute: 0.5 10*3/uL (ref 0.0–0.5)
Eosinophils Relative: 4 %
Immature Granulocytes: 0 %
Lymphocytes Relative: 54 %
Lymphs Abs: 6 10*3/uL — ABNORMAL HIGH (ref 0.7–4.0)
Monocytes Absolute: 1.1 10*3/uL — ABNORMAL HIGH (ref 0.1–1.0)
Monocytes Relative: 10 %
Neutro Abs: 3.6 10*3/uL (ref 1.7–7.7)
Neutrophils Relative %: 32 %

## 2019-10-30 LAB — APTT: aPTT: 32 seconds (ref 24–36)

## 2019-10-30 LAB — PROTIME-INR
INR: 1 (ref 0.8–1.2)
Prothrombin Time: 13.1 seconds (ref 11.4–15.2)

## 2019-10-30 LAB — RESPIRATORY PANEL BY RT PCR (FLU A&B, COVID)
Influenza A by PCR: NEGATIVE
Influenza B by PCR: NEGATIVE
SARS Coronavirus 2 by RT PCR: NEGATIVE

## 2019-10-30 LAB — ETHANOL: Alcohol, Ethyl (B): <10 mg/dL (ref <10)

## 2019-10-30 LAB — CBG MONITORING, ED: Glucose-Capillary: 92 mg/dL (ref 70–99)

## 2019-10-30 MED ORDER — IOHEXOL 350 MG/ML SOLN
125.0000 mL | Freq: Once | INTRAVENOUS | Status: AC | PRN
  Administered 2019-10-30: 125 mL via INTRAVENOUS

## 2019-10-30 MED ORDER — ACETAMINOPHEN 160 MG/5ML PO SOLN: 650.0000 mg | ORAL | Status: DC | PRN

## 2019-10-30 MED ORDER — SODIUM CHLORIDE 0.9 % IV SOLN
50.0000 mL | Freq: Once | INTRAVENOUS | Status: AC
  Administered 2019-10-30: 50 mL via INTRAVENOUS

## 2019-10-30 MED ORDER — STROKE: EARLY STAGES OF RECOVERY BOOK
Freq: Once | Status: AC
  Filled 2019-10-30: qty 1

## 2019-10-30 MED ORDER — CLEVIDIPINE BUTYRATE 0.5 MG/ML IV EMUL: 0.0000 mg/h | INTRAVENOUS | Status: DC

## 2019-10-30 MED ORDER — ACETAMINOPHEN 650 MG RE SUPP: 650.0000 mg | RECTAL | Status: DC | PRN

## 2019-10-30 MED ORDER — SENNOSIDES-DOCUSATE SODIUM 8.6-50 MG PO TABS: 1.0000 | ORAL_TABLET | Freq: Every evening | ORAL | Status: DC | PRN

## 2019-10-30 MED ORDER — SODIUM CHLORIDE 0.9 % IV SOLN: INTRAVENOUS | Status: DC

## 2019-10-30 MED ORDER — ALTEPLASE (STROKE) FULL DOSE INFUSION
0.9000 mg/kg | Freq: Once | INTRAVENOUS | Status: AC
  Administered 2019-10-30: 89 mg via INTRAVENOUS
  Filled 2019-10-30: qty 100

## 2019-10-30 MED ORDER — CHLORHEXIDINE GLUCONATE CLOTH 2 % EX PADS
6.0000 | MEDICATED_PAD | Freq: Every day | CUTANEOUS | Status: DC
  Administered 2019-11-01: 6 via TOPICAL

## 2019-10-30 MED ORDER — PANTOPRAZOLE SODIUM 40 MG IV SOLR
40.0000 mg | Freq: Every day | INTRAVENOUS | Status: DC
  Administered 2019-10-30 – 2019-10-31 (×2): 40 mg via INTRAVENOUS
  Filled 2019-10-30 (×2): qty 40

## 2019-10-30 MED ORDER — ACETAMINOPHEN 325 MG PO TABS: 650.0000 mg | ORAL_TABLET | ORAL | Status: DC | PRN

## 2019-10-30 MED ORDER — LABETALOL HCL 5 MG/ML IV SOLN: 20.0000 mg | Freq: Once | INTRAVENOUS | Status: DC

## 2019-10-30 NOTE — ED Provider Notes (Addendum)
[facility] DEPARTMENT Provider Note   CSN: [id number] Arrival date & time: 10/30/19  2015     History No chief complaint on file.   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  Patient is a 79 year old male with a history of hyperlipidemia who is presenting today with left-sided weakness and numbness.  Symptoms started at 745 per his wife who is also present and able to give history.  Upon arrival here patient states his symptoms have improved and he has only minimal weakness and normal sensation.  Stroke team was present upon patient's arrival.  He denies any fever, chest pain, shortness of breath.  He is otherwise been in his normal state of health today until this evening right before he was getting ready to go to bed.  He initially tried to shake it off but it was not getting any better so they called 911.  No prior history of similar.  The history is provided by the patient, the spouse and the EMS personnel.       No past medical history on file.  Patient Active Problem List   Diagnosis Date Noted  . Localized osteoarthrosis of right hip, mod-sev 07/22/2019  . Otitis media 04/12/2019  . Otitis externa 04/12/2019  . Digital mucinous cyst of finger 02/09/2019  . Chronic pain of right knee 06/17/2018  . Wears hearing aid 07/04/2016  . 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 Hyperlipidemia 06/29/2016  . 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 Vitamin D deficiency 06/29/2016  . BPH (benign prostatic hyperplasia) 06/29/2016    Past Surgical History:  Procedure Laterality Date  . CHOLECYSTECTOMY    . INGUINAL HERNIA REPAIR Right 01/24/2017   Procedure: LAPAROSCOPIC RIGHT INGUINAL HERNIA WITH MESH;  Surgeon: Ralene Ok, MD;  Location: [facility];  Service: General;  Laterality: Right;  . INSERTION OF MESH Right 01/24/2017   Procedure: INSERTION OF MESH;  Surgeon: Ralene Ok, MD;  Location: [facility];  Service: General;  Laterality: Right;        Family History  Problem Relation Age of Onset  . Arthritis Mother   . Heart disease Father   . Arthritis Father   . Diabetes Maternal Aunt   . Cancer Paternal Uncle        lung  . Stroke Maternal Grandmother   . Diabetes Maternal Grandmother     Social History   Tobacco Use  . Smoking status: Former Research scientist (life sciences)  . Smokeless tobacco: Never Used  Substance Use Topics  . Alcohol use: No  . Drug use: No    Home Medications Prior to Admission medications   Medication Sig Start Date End Date Taking? Authorizing Provider  clotrimazole (LOTRIMIN) 1 % external solution Apply 1 application topically 2 (two) times daily. Use for 10 - 14 days in right ear 05/11/19   Burns, Claudina Lick, MD  NEOMYCIN-POLYMYXIN-HYDROCORTISONE (CORTISPORIN) 1 % SOLN OTIC solution Place 3 drops into the right ear 4 (four) times daily. 04/12/19   Binnie Rail, MD    Allergies    Simvastatin and Lipitor [atorvastatin]  Review of Systems   Review of Systems  All other systems reviewed and are negative.   Physical Exam Updated Vital Signs BP (!) 167/90 (BP Location: Right Arm)   Pulse 87   Temp 98.3 F (36.8 C) (Oral)   Resp 18   Ht 5\' 11"  (1.803 m)   Wt 98.9 kg   SpO2 98%   BMI 30.41 kg/m   Physical Exam Vitals  and nursing note reviewed.  Constitutional:      General: He is not in acute distress.    Appearance: Normal appearance. He is well-developed and normal weight.  HENT:     Head: Normocephalic and atraumatic.     Mouth/Throat:     Mouth: Mucous membranes are moist.  Eyes:     General: No visual field deficit.    Conjunctiva/sclera: Conjunctivae normal.     Pupils: Pupils are equal, round, and reactive to light.  Cardiovascular:     Rate and Rhythm: Normal rate and regular rhythm.     Pulses: Normal pulses.     Heart sounds: No murmur.  Pulmonary:     Effort: Pulmonary effort is normal. No respiratory distress.     Breath sounds: Normal breath sounds. No wheezing or rales.   Abdominal:     General: There is no distension.     Palpations: Abdomen is soft.     Tenderness: There is no abdominal tenderness. There is no guarding or rebound.  Musculoskeletal:        General: No tenderness. Normal range of motion.     Cervical back: Normal range of motion and neck supple.     Right lower leg: No edema.     Left lower leg: No edema.  Skin:    General: Skin is warm and dry.     Findings: No erythema or rash.  Neurological:     Mental Status: He is alert and oriented to person, place, and time.     Cranial Nerves: No dysarthria or facial asymmetry.     Sensory: Sensory deficit present.     Motor: Weakness present.     Comments: 3 out of 5 strength in the left lower extremity, 4 out of 5 strength in the left upper extremity.  5 out of 5 strength in the right upper and lower extremity.  No notable facial droop.  No aphasia.  Decreased sensation in the left arm and leg.  Psychiatric:        Behavior: Behavior normal.     ED Results / Procedures / Treatments   Labs (all labs ordered are listed, but only abnormal results are displayed) Labs Reviewed  CBC - Abnormal; Notable for the following components:      Result Value   WBC 11.3 (*)    All other components within normal limits  COMPREHENSIVE METABOLIC PANEL - Abnormal; Notable for the following components:   Glucose, Bld 104 (*)    GFR calc non Af Amer 57 (*)    All other components within normal limits  I-STAT CHEM 8, ED - Abnormal; Notable for the following components:   Calcium, Ion 1.06 (*)    All other components within normal limits  RESPIRATORY PANEL BY RT PCR (FLU A&B, COVID)  SARS CORONAVIRUS 2 (TAT 6-24 HRS)  ETHANOL  PROTIME-INR  APTT  DIFFERENTIAL  RAPID URINE DRUG SCREEN, HOSP PERFORMED  URINALYSIS, ROUTINE W REFLEX MICROSCOPIC  HEMOGLOBIN A1C  LIPID PANEL  CBG MONITORING, ED  TYPE AND SCREEN    EKG EKG Interpretation  Date/Time:  Saturday October 30 2019 21:10:35 EST Ventricular  Rate:  92 PR Interval:    QRS Duration: 100 QT Interval:  361 QTC Calculation: 447 R Axis:   -81 Text Interpretation: Sinus rhythm Left axis deviation Borderline low voltage, extremity leads No significant change since last tracing Confirmed by Blanchie Dessert [phone]) on 10/30/2019 9:17:59 PM   Radiology CT Code Stroke CTA Head W/WO  contrast  Result Date: 10/30/2019 CLINICAL DATA:  Code stroke.  Acute onset of left-sided weakness. EXAM: CT ANGIOGRAPHY HEAD AND NECK CT PERFUSION BRAIN TECHNIQUE: Multidetector CT imaging of the head and neck was performed using the standard protocol during bolus administration of intravenous contrast. Multiplanar CT image reconstructions and MIPs were obtained to evaluate the vascular anatomy. Carotid stenosis measurements (when applicable) are obtained utilizing NASCET criteria, using the distal internal carotid diameter as the denominator. Multiphase CT imaging of the brain was performed following IV bolus contrast injection. Subsequent parametric perfusion maps were calculated using RAPID software. CONTRAST:  162mL OMNIPAQUE IOHEXOL 350 MG/ML SOLN COMPARISON:  CT head without contrast 10/30/2019 FINDINGS: CTA NECK FINDINGS Aortic arch: A 3 vessel arch configuration is present. Atherosclerotic changes involve the origin of the left subclavian artery. Additional calcifications are present in the distal aorta without aneurysm. No focal stenosis is present. Right carotid system: Right common carotid artery is within normal limits. Mild atherosclerotic changes are noted at the right carotid bifurcation. There is no significant stenosis relative to the more distal lumen. Cervical right ICA is otherwise within normal limits. Left carotid system: The left common carotid artery is within normal limits. Atherosclerotic disease is present in the proximal left ICA without a significant stenosis relative to the more distal vessel. Vertebral arteries: The right vertebral artery is  dominant. Moderate proximal right vertebral artery stenosis is present at the origin from the subclavian. High-grade stenosis is present at the origin of the non dominant left vertebral artery. No additional stenosis is present in either vertebral artery in the neck. Moderate stenosis is present in the left vertebral artery at the V3 segment. Skeleton: Multilevel facet degenerative changes are present throughout the cervical spine, right greater than left. Uncovertebral disease is greatest at C6-7. No focal lytic or blastic lesions are present. Other neck: The soft tissues the neck are unremarkable. Thyroid is normal. No significant adenopathy is present. No mucosal or submucosal lesions are present. Salivary glands are within normal limits. Upper chest: The lung apices are clear. Thoracic inlet is within normal limits. Review of the MIP images confirms the above findings CTA HEAD FINDINGS Anterior circulation: Atherosclerotic calcifications are present within the cavernous internal carotid arteries bilaterally. No significant stenosis is present through the ICA terminus. The M1 segments are within normal limits bilaterally. The right A1 segment is dominant. The anterior communicating artery is patent. Hypoplastic left A1 segment is noted. MCA bifurcations are intact. Segmental attenuation is present within distal ACA and MCA branches bilaterally. Posterior circulation: Tandem stenoses are present in the left V4 segment. There is mild narrowing in the right vertebral artery proximal to the basilar. Basilar artery is normal. Both posterior cerebral arteries originate from the basilar tip. There is some attenuation of distal PCA branch vessels bilaterally without a significant proximal stenosis or occlusion. Venous sinuses: The dural sinuses are patent. The straight sinus and deep cerebral veins are intact. Cortical veins are within normal limits. No vascular malformations are evident. Anatomic variants: None Review  of the MIP images confirms the above findings CT Brain Perfusion Findings: ASPECTS: 10/10 CBF (<30%) Volume: 46mL Perfusion (Tmax>6.0s) volume: 94mL Mismatch Volume: 82mL Infarction Location:None IMPRESSION: 1. No emergent large vessel occlusion. 2. No significant carotid stenosis in the neck. 3. 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 4. 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 5. Segmental narrowing throughout ACA, MCA, and PCA branch vessels  compatible with intracranial atherosclerotic change. 6.  Aortic Atherosclerosis (ICD10-I70.0). 7. Atherosclerotic changes at the carotid bifurcations bilaterally and at the cavernous internal carotid arteries without significant stenosis. The above was relayed via text pager to Dr. Amie Portland on 10/30/2019 at 21:06 . Electronically Signed   By: San Morelle M.D.   On: 10/30/2019 20:15   CT Code Stroke CTA Neck W/WO contrast  Result Date: 10/30/2019 CLINICAL DATA:  Code stroke.  Acute onset of left-sided weakness. EXAM: CT ANGIOGRAPHY HEAD AND NECK CT PERFUSION BRAIN TECHNIQUE: Multidetector CT imaging of the head and neck was performed using the standard protocol during bolus administration of intravenous contrast. Multiplanar CT image reconstructions and MIPs were obtained to evaluate the vascular anatomy. Carotid stenosis measurements (when applicable) are obtained utilizing NASCET criteria, using the distal internal carotid diameter as the denominator. Multiphase CT imaging of the brain was performed following IV bolus contrast injection. Subsequent parametric perfusion maps were calculated using RAPID software. CONTRAST:  147mL OMNIPAQUE IOHEXOL 350 MG/ML SOLN COMPARISON:  CT head without contrast 10/30/2019 FINDINGS: CTA NECK FINDINGS Aortic arch: A 3 vessel arch configuration is present. Atherosclerotic changes involve the origin of the left subclavian artery. Additional  calcifications are present in the distal aorta without aneurysm. No focal stenosis is present. Right carotid system: Right common carotid artery is within normal limits. Mild atherosclerotic changes are noted at the right carotid bifurcation. There is no significant stenosis relative to the more distal lumen. Cervical right ICA is otherwise within normal limits. Left carotid system: The left common carotid artery is within normal limits. Atherosclerotic disease is present in the proximal left ICA without a significant stenosis relative to the more distal vessel. Vertebral arteries: The right vertebral artery is dominant. Moderate proximal right vertebral artery stenosis is present at the origin from the subclavian. High-grade stenosis is present at the origin of the non dominant left vertebral artery. No additional stenosis is present in either vertebral artery in the neck. Moderate stenosis is present in the left vertebral artery at the V3 segment. Skeleton: Multilevel facet degenerative changes are present throughout the cervical spine, right greater than left. Uncovertebral disease is greatest at C6-7. No focal lytic or blastic lesions are present. Other neck: The soft tissues the neck are unremarkable. Thyroid is normal. No significant adenopathy is present. No mucosal or submucosal lesions are present. Salivary glands are within normal limits. Upper chest: The lung apices are clear. Thoracic inlet is within normal limits. Review of the MIP images confirms the above findings CTA HEAD FINDINGS Anterior circulation: Atherosclerotic calcifications are present within the cavernous internal carotid arteries bilaterally. No significant stenosis is present through the ICA terminus. The M1 segments are within normal limits bilaterally. The right A1 segment is dominant. The anterior communicating artery is patent. Hypoplastic left A1 segment is noted. MCA bifurcations are intact. Segmental attenuation is present within  distal ACA and MCA branches bilaterally. Posterior circulation: Tandem stenoses are present in the left V4 segment. There is mild narrowing in the right vertebral artery proximal to the basilar. Basilar artery is normal. Both posterior cerebral arteries originate from the basilar tip. There is some attenuation of distal PCA branch vessels bilaterally without a significant proximal stenosis or occlusion. Venous sinuses: The dural sinuses are patent. The straight sinus and deep cerebral veins are intact. Cortical veins are within normal limits. No vascular malformations are evident. Anatomic variants: None Review of the MIP images confirms the above findings CT Brain Perfusion Findings: ASPECTS: 10/10 CBF (<  30%) Volume: 31mL Perfusion (Tmax>6.0s) volume: 62mL Mismatch Volume: 63mL Infarction Location:None IMPRESSION: 1. No emergent large vessel occlusion. 2. No significant carotid stenosis in the neck. 3. 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 4. 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 5. Segmental narrowing throughout ACA, MCA, and PCA branch vessels compatible with intracranial atherosclerotic change. 6.  Aortic Atherosclerosis (ICD10-I70.0). 7. Atherosclerotic changes at the carotid bifurcations bilaterally and at the cavernous internal carotid arteries without significant stenosis. The above was relayed via text pager to Dr. Amie Portland on 10/30/2019 at 21:06 . Electronically Signed   By: San Morelle M.D.   On: 10/30/2019 20:15   CT Code Stroke Cerebral Perfusion with contrast  Result Date: 10/30/2019 CLINICAL DATA:  Code stroke.  Acute onset of left-sided weakness. EXAM: CT ANGIOGRAPHY HEAD AND NECK CT PERFUSION BRAIN TECHNIQUE: Multidetector CT imaging of the head and neck was performed using the standard protocol during bolus administration of intravenous contrast. Multiplanar CT image reconstructions and MIPs were obtained  to evaluate the vascular anatomy. Carotid stenosis measurements (when applicable) are obtained utilizing NASCET criteria, using the distal internal carotid diameter as the denominator. Multiphase CT imaging of the brain was performed following IV bolus contrast injection. Subsequent parametric perfusion maps were calculated using RAPID software. CONTRAST:  165mL OMNIPAQUE IOHEXOL 350 MG/ML SOLN COMPARISON:  CT head without contrast 10/30/2019 FINDINGS: CTA NECK FINDINGS Aortic arch: A 3 vessel arch configuration is present. Atherosclerotic changes involve the origin of the left subclavian artery. Additional calcifications are present in the distal aorta without aneurysm. No focal stenosis is present. Right carotid system: Right common carotid artery is within normal limits. Mild atherosclerotic changes are noted at the right carotid bifurcation. There is no significant stenosis relative to the more distal lumen. Cervical right ICA is otherwise within normal limits. Left carotid system: The left common carotid artery is within normal limits. Atherosclerotic disease is present in the proximal left ICA without a significant stenosis relative to the more distal vessel. Vertebral arteries: The right vertebral artery is dominant. Moderate proximal right vertebral artery stenosis is present at the origin from the subclavian. High-grade stenosis is present at the origin of the non dominant left vertebral artery. No additional stenosis is present in either vertebral artery in the neck. Moderate stenosis is present in the left vertebral artery at the V3 segment. Skeleton: Multilevel facet degenerative changes are present throughout the cervical spine, right greater than left. Uncovertebral disease is greatest at C6-7. No focal lytic or blastic lesions are present. Other neck: The soft tissues the neck are unremarkable. Thyroid is normal. No significant adenopathy is present. No mucosal or submucosal lesions are present.  Salivary glands are within normal limits. Upper chest: The lung apices are clear. Thoracic inlet is within normal limits. Review of the MIP images confirms the above findings CTA HEAD FINDINGS Anterior circulation: Atherosclerotic calcifications are present within the cavernous internal carotid arteries bilaterally. No significant stenosis is present through the ICA terminus. The M1 segments are within normal limits bilaterally. The right A1 segment is dominant. The anterior communicating artery is patent. Hypoplastic left A1 segment is noted. MCA bifurcations are intact. Segmental attenuation is present within distal ACA and MCA branches bilaterally. Posterior circulation: Tandem stenoses are present in the left V4 segment. There is mild narrowing in the right vertebral artery proximal to the basilar. Basilar artery is normal. Both posterior cerebral arteries originate from  the basilar tip. There is some attenuation of distal PCA branch vessels bilaterally without a significant proximal stenosis or occlusion. Venous sinuses: The dural sinuses are patent. The straight sinus and deep cerebral veins are intact. Cortical veins are within normal limits. No vascular malformations are evident. Anatomic variants: None Review of the MIP images confirms the above findings CT Brain Perfusion Findings: ASPECTS: 10/10 CBF (<30%) Volume: 36mL Perfusion (Tmax>6.0s) volume: 50mL Mismatch Volume: 90mL Infarction Location:None IMPRESSION: 1. No emergent large vessel occlusion. 2. No significant carotid stenosis in the neck. 3. 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 4. 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 5. Segmental narrowing throughout ACA, MCA, and PCA branch vessels compatible with intracranial atherosclerotic change. 6.  Aortic Atherosclerosis (ICD10-I70.0). 7. Atherosclerotic changes at the carotid bifurcations bilaterally and at the cavernous  internal carotid arteries without significant stenosis. The above was relayed via text pager to Dr. Amie Portland on 10/30/2019 at 21:06 . Electronically Signed   By: San Morelle M.D.   On: 10/30/2019 20:15   CT HEAD CODE STROKE WO CONTRAST  Result Date: 10/30/2019 CLINICAL DATA:  Code stroke.  Acute onset of left-sided weakness. EXAM: CT HEAD WITHOUT CONTRAST TECHNIQUE: Contiguous axial images were obtained from the base of the skull through the vertex without intravenous contrast. COMPARISON:  None. FINDINGS: Brain: Mild subcortical white matter hypoattenuation is present bilaterally. No acute infarct, hemorrhage, or mass lesion is present. Basal ganglia are intact. Insular ribbon is normal. No acute or focal cortical abnormalities are present. The brainstem and cerebellum are within normal limits. Vascular: Atherosclerotic calcifications are present within the cavernous internal carotid arteries bilaterally. No hyperdense vessel is present. Skull: Calvarium is intact. No focal lytic or blastic lesions are present. No significant extracranial soft tissue lesion is present. Sinuses/Orbits: The paranasal sinuses and mastoid air cells are clear. The globes and orbits are within normal limits. ASPECTS [facility] Stroke Program Early CT Score) - Ganglionic level infarction (caudate, lentiform nuclei, internal capsule, insula, M1-M3 cortex): 7/7 - Supraganglionic infarction (M4-M6 cortex): 3/3 Total score (0-10 with 10 being normal): 10/10 IMPRESSION: 1. No acute intracranial abnormality 2. Mild diffuse subcortical white matter hypoattenuation is likely related to chronic microvascular ischemia. 3. Atherosclerosis. 4. ASPECTS is 10/10 The above was relayed via text pager to Dr. Rory Percy on 10/30/2019 at 20:39 . Electronically Signed   By: San Morelle M.D.   On: 10/30/2019 20:39    Procedures Procedures (including critical care time)  Medications Ordered in ED Medications - No data to display  ED  Course  I have reviewed the triage vital signs and the nursing notes.  Pertinent labs & imaging results that were available during my care of the patient were reviewed by me and considered in my medical decision making (see chart for details).    MDM Rules/Calculators/A&P                      Patient presenting via EMS who was seen immediately upon arrival by the stroke team due to new left-sided symptoms of numbness and weakness.  Patient's symptoms were waxing and waning and upon arrival here NIH had improved to 1.  However while in the CT scanner patient started complaining of worsening numbness.  Patient now has more significant weakness in the left arm and leg and decreased sensation.  Patient's CT was negative for acute bleed.  He is hypertensive at  165/90.  Otherwise he is maintaining his airway and does not have aphasia.  Given patient's fluctuating exam and worsening symptoms the decision was made to do TPA.  Dr. Rory Percy discussed these things with his wife.   CBC and Chem-8 without significant findings.  INR within normal limits.  Patient was started on TPA.  Close neuro exams to follow.  9:23 PM EKG without acute changes, CTA showed no emergent large vessel occlusion in the head or neck.  Patient admitted to the neuro ICU.  CRITICAL CARE Performed by: Si Jachim Total critical care time: 30 minutes Critical care time was exclusive of separately billable procedures and treating other patients. Critical care was necessary to treat or prevent imminent or life-threatening deterioration. Critical care was time spent personally by me on the following activities: development of treatment plan with patient and/or surrogate as well as nursing, discussions with consultants, evaluation of patient's response to treatment, examination of patient, obtaining history from patient or surrogate, ordering and performing treatments and interventions, ordering and review of laboratory studies, ordering  and review of radiographic studies, pulse oximetry and re-evaluation of patient's condition.  Final Clinical Impression(s) / ED Diagnoses Final diagnoses:  Cerebrovascular accident (CVA), unspecified mechanism [facility])    Rx / DC Orders ED Discharge Orders    None       Blanchie Dessert, MD 10/30/19 2050    Blanchie Dessert, MD 10/30/19 2123

## 2019-10-30 NOTE — H&P (Signed)
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 The wife reports that the had gotten into bed to get into their nightly ritual of sleeping when he suddenly complained that he feels tingling on his left arm.  He tried to shake the left arm off as if he could check off a sleeping arm but sensation did not go away.  Soon after he started noting his left leg also is feeling numb.  The wife suggested that she will call 911 but he refused EMS to be called.  He does started complaining of his face becoming numb and that is when she decided to get in the car and drove him to the emergency room.  He was seen initially at the ER triage and a code stroke was called after the ED providers had assessed him emergently there.  I saw the patient in the CT scanner. On my initial examination, his NIH stroke scale was 1 that she got the month wrong. As I completed the CT scan and reevaluated him, his left-sided numbness and weakness had started to become worse and his NIH stroke scale increased to a total of 5.  See details in the exam section below. Discussed risks and benefits of IV TPA at that time and IV TPA was pushed at 2043 hrs. Vessel studies were obtained after TPA administration and did not show any emergent large vessel occlusion.  See detailed read below.  Never had a stroke in the past.  No history of heart attacks. 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 He has received the first shot of Covid vaccine 2 weeks ago and is due for the second dose in the coming 2 weeks.  LKW: 7:45 PM on 10/30/2019 tpa given?:  Yes Premorbid modified Rankin scale (mRS): 1-some difficulty walking due to arthritis  ROS: Complete and negative except as  noted in HPI.  No past medical history on file. Past medical history-arthritis  Family History  Problem Relation Age of Onset  . Arthritis Mother   . Heart disease Father   . Arthritis Father   . Diabetes Maternal Aunt   . Cancer Paternal Uncle        lung  . Stroke Maternal Grandmother   . Diabetes Maternal Grandmother    Social History:   reports that he has quit smoking. He has never used smokeless tobacco. He reports that he does not drink alcohol or use drugs.  Medications No current facility-administered medications for this encounter.  Current Outpatient Medications:  .  clotrimazole (LOTRIMIN) 1 % external solution, Apply 1 application topically 2 (two) times daily. Use for 10 - 14 days in right ear, Disp: 30 mL, Rfl: 0 .  NEOMYCIN-POLYMYXIN-HYDROCORTISONE (CORTISPORIN) 1 % SOLN OTIC solution, Place 3 drops into the right ear 4 (four) times daily., Disp: 10 mL, Rfl: 0  Exam: Current vital signs: BP (!) 167/90 (BP Location: Right Arm)   Pulse 87   Temp 98.3 F (36.8 C) (Oral)   Resp 18   Ht 5\' 11"  (1.803 m)   Wt 98.9 kg   SpO2 98%   BMI 30.41 kg/m  Vital signs in last 24 hours: Temp:  [98.3 F (36.8 C)] 98.3 F (36.8 C) (03/13  2030) Pulse Rate:  [87] 87 (03/13 2030) Resp:  [18] 18 (03/13 2030) BP: (167)/(90) 167/90 (03/13 2030) SpO2:  [98 %] 98 % (03/13 2030) Weight:  [98.9 kg] 98.9 kg (03/13 2119) General: Awake alert in no distress HEENT: Normocephalic atraumatic Lungs: Clear to auscultation Cardiovascular: S1-S2 heard regular rate rhythm Abdomen: Obese nontender Extremities warm well perfused Neurological exam Initial examination awake alert oriented x2-could not tell me the month right. Speech was not dysarthric initially but later on after the CT scan showed mild dysarthria. No evidence of aphasia before or after the CAT scan Cranial nerves: Pupils equal round react light, extraocular was intact, visual fields full, face symmetric, facial sensation  was initially normal bilaterally but after the scan when reexamined, he noted diminished sensation on the left face. Motor exam: Initial exam no drift in any of the 4 extremities although the left lower extremity was 4+/5.  After the scan, left upper and lower extremities were significantly different and had a vertical drift both being 4/5. Normal strength on right before and after the scan. Sensory exam: Initially when I examined him there was no difference in sensation on right or left side.  After the scan when I examined him, he had significantly diminished sensation on the left hemibody. Coordination: No ataxia Gait testing deferred at this point NIH stroke scale on the first examination was 1 only for the month. On repeat exam within the next few minutes, NIH stroke scale was 5.  That is when the decision to use IV TPA was made.  Labs I have reviewed labs in epic and the results pertinent to this consultation are:  CBC    Component Value Date/Time   WBC 11.3 (H) 10/30/2019 2022   RBC 5.40 10/30/2019 2022   HGB 17.0 10/30/2019 2028   HCT 50.0 10/30/2019 2028   PLT 216 10/30/2019 2022   MCV 96.3 10/30/2019 2022   MCH 30.9 10/30/2019 2022   MCHC 32.1 10/30/2019 2022   RDW 13.7 10/30/2019 2022   LYMPHSABS 6.0 (H) 10/30/2019 2022   MONOABS 1.1 (H) 10/30/2019 2022   EOSABS 0.5 10/30/2019 2022   BASOSABS 0.0 10/30/2019 2022    CMP     Component Value Date/Time   NA 140 10/30/2019 2028   K 3.9 10/30/2019 2028   CL 109 10/30/2019 2028   CO2 23 10/30/2019 2022   GLUCOSE 99 10/30/2019 2028   BUN 20 10/30/2019 2028   CREATININE 1.10 10/30/2019 2028   CALCIUM 9.1 10/30/2019 2022   PROT 7.3 10/30/2019 2022   ALBUMIN 4.2 10/30/2019 2022   AST 32 10/30/2019 2022   ALT 26 10/30/2019 2022   ALKPHOS 57 10/30/2019 2022   BILITOT 0.5 10/30/2019 2022   GFRNONAA 57 (L) 10/30/2019 2022   GFRAA >60 10/30/2019 2022   Imaging I have reviewed the images obtained: CT-scan of the  brain-aspects 10.  Chronic small vessel disease.  No bleed.  No evolving big infarct. CTA head and neck-no emergent LVO.  Diffuse medium/small vessel disease.  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 CT perfusion study of the brain -no perfusion deficit.  Assessment: 79 year old man with past medical history of arthritis on no medications presented for evaluation of sudden onset of left hemibody numbness followed by weakness with some resolution of symptoms and then again worsening of the symptoms while he was getting imaging during his evaluation as an acute code stroke. Initial decision was not to give him IV TPA due to  NIH stroke scale only of 1 but his symptoms worsened as he was getting his imaging done to an NIH stroke scale of 5 which involved left hemibody sensory symptoms and left upper and lower extremity drift along with mild dysarthria. IV TPA was given at 2043 hrs. Likely a fluctuating lacunar infarct-etiology small vessel disease.  Plan: Acute ischemic stroke-etiology under investigation Acuity: Acute Admit to neuro ICU -Hold Aspirin until 24 hour post tPA neuroimaging is stable and without evidence of bleeding -Blood pressure control, goal of SYS <180.  Post TPA vitals. -MRI/ECHO/A1C/Lipid panel. -Hyperglycemia management per SSI to maintain glucose 140-180mg /dL. -PT/OT/ST therapies and recommendations when able  CNS -Close neuro monitoring  Dysarthria -NPO until cleared by speech or bedside stroke swallow screen -ST -Advance diet as tolerated  Hemiparesis following cerebral infarction affecting left non-dominant side  -PT/OT -PM&R consult  RESP No active issues Monitor clinically Obtain chest x-ray  CV No known history of hypertension.  Arrived with systolics in the [id number] to [id number]. Monitor closely per post TPA vitals. Obtain 2D echocardiogram  Hyperlipidemia, unspecified  -Check lipid panel - Statin for goal LDL < 70  HEME Mild  leukocytosis Check labs in the morning Check UA chest x-ray Not on any blood thinners  ENDO -check A1c-goal HgbA1c < 7  GI/GU -Gentle hydration  Fluid/Electrolyte Disorders Check labs and replete electrolytes as necessary No current abnormalities.  ID Check chest x-ray and UA.  Prophylaxis DVT: SCDs GI: PPI Bowel: Docusate senna  Diet: NPO until cleared by speech/stroke swallow screen  Code Status: Full Code    THE FOLLOWING WERE PRESENT ON ADMISSION: Acute ischemic stroke, hemiparesis, dysphagia/dysarthria.  Discussed my plan in detail with the ED provider Dr. Maryan Rued as well as the patient's wife at bedside. Also spoke with the son Stein Burgos over the phone and updated him with the current condition of the patient as well as plan going forward.   -- Amie Portland, MD [facility] Pager: [phone] If 7pm to 7am, please call on call as listed on AMION.   CRITICAL CARE ATTESTATION Performed by: Amie Portland, MD Total critical care time: 60 minutes Critical care time was exclusive of separately billable procedures and treating other patients and/or supervising APPs/Residents/Students Critical care was necessary to treat or prevent imminent or life-threatening deterioration due to acute ischemic stroke, providing IV thrombolysis This patient is critically ill and at significant risk for neurological worsening and/or death and care requires constant monitoring. Critical care was time spent personally by me on the following activities: development of treatment plan with patient and/or surrogate as well as nursing, discussions with consultants, evaluation of patient's response to treatment, examination of patient, obtaining history from patient or surrogate, ordering and performing treatments and interventions, ordering and review of laboratory studies, ordering and review of radiographic studies, pulse oximetry, re-evaluation of patient's condition,  participation in multidisciplinary rounds and medical decision making of high complexity in the care of this patient.

## 2019-10-30 NOTE — Progress Notes (Signed)
PHARMACIST CODE STROKE RESPONSE  Notified to mix tPA at 2039 by Dr. Rory Percy Delivered tPA to RN at 2042  tPA dose = 8.9mg  bolus over 1 minute followed by 80.1mg  for a total dose of 89mg  over 1 hour  Issues/delays encountered (if applicable): [place] PharmD. BCPS  10/30/19 8:45 PM

## 2019-10-30 NOTE — ED Triage Notes (Signed)
Pt c/o left side weakness, slurred speech started around [place], [place]. Pt AO x 4 on arrival.

## 2019-10-31 ENCOUNTER — Inpatient Hospital Stay (HOSPITAL_COMMUNITY): Payer: Medicare Other

## 2019-10-31 DIAGNOSIS — I6389 Other cerebral infarction: Secondary | ICD-10-CM

## 2019-10-31 LAB — LIPID PANEL
Cholesterol: 209 mg/dL — ABNORMAL HIGH (ref 0–200)
HDL: 32 mg/dL — ABNORMAL LOW (ref >40)
LDL Cholesterol: 151 mg/dL — ABNORMAL HIGH (ref 0–99)
Total CHOL/HDL Ratio: 6.5 RATIO
Triglycerides: 128 mg/dL (ref <150)
VLDL: 26 mg/dL (ref 0–40)

## 2019-10-31 LAB — TYPE AND SCREEN
ABO/RH(D): A POS
Antibody Screen: NEGATIVE

## 2019-10-31 LAB — ECHOCARDIOGRAM COMPLETE
Height: 71 in
Weight: 3488.56 oz

## 2019-10-31 LAB — HEMOGLOBIN A1C
Hgb A1c MFr Bld: 5.4 % (ref 4.8–5.6)
Mean Plasma Glucose: 108.28 mg/dL

## 2019-10-31 LAB — ABO/RH: ABO/RH(D): A POS

## 2019-10-31 LAB — MRSA PCR SCREENING: MRSA by PCR: NEGATIVE

## 2019-10-31 NOTE — Progress Notes (Signed)
  Echocardiogram 2D Echocardiogram has been performed.  Joe Robertson 10/31/2019, 11:45 AM

## 2019-10-31 NOTE — Evaluation (Signed)
Occupational Therapy Evaluation Patient Details Name: Joe Wantz, MD MRN: [id number] DOB: 08-Jul-1941 Today's Date: 10/31/2019    History of Present Illness Pt is a 79 y.o. M with significant PMH of arthritis who presents with sudden onset of left sided numbess. IV TPA pushed at 2043 hrs. CT head with no acute intracranial abnormality. CTA with no emergent LVO. MRI pending.   Clinical Impression   Pt PTA: Pt living with spouse and independent with ADL and mobility. Pt currently A/Ox4, but presents with deficits in memory and attention. Pt performing ADL with supervisionA overall. Pt with 1 LOB episode with minguardA to right reaction. Pt would benefit from higher level cognition/ADL tasks to determine safety at home. OT following acutely.    Follow Up Recommendations  Outpatient OT;Supervision/Assistance - 24 hour(initially- SLP may be able to address higher level cog.)    Equipment Recommendations  None recommended by OT    Recommendations for Other Services       Precautions / Restrictions Precautions Precautions: Fall Restrictions Weight Bearing Restrictions: No      Mobility Bed Mobility               General bed mobility comments: OOB in chair  Transfers Overall transfer level: Needs assistance Equipment used: None Transfers: Sit to/from Stand;Stand Pivot Transfers Sit to Stand: Supervision Stand pivot transfers: Supervision            Balance Overall balance assessment: Mild deficits observed, not formally tested                                         ADL either performed or assessed with clinical judgement   ADL Overall ADL's : At baseline                                       General ADL Comments: Pt supervisionA due to L some LOB episodes requiring assist for righting reactions.     Vision Baseline Vision/History: No visual deficits;Wears glasses Wears Glasses: Distance only Patient Visual Report: No  change from baseline Vision Assessment?: Yes Eye Alignment: Within Functional Limits Ocular Range of Motion: Within Functional Limits Alignment/Gaze Preference: Within Defined Limits Tracking/Visual Pursuits: Able to track stimulus in all quads without difficulty     Perception     Praxis      Pertinent Vitals/Pain Pain Assessment: No/denies pain     Hand Dominance     Extremity/Trunk Assessment Upper Extremity Assessment Upper Extremity Assessment: Overall WFL for tasks assessed   Lower Extremity Assessment Lower Extremity Assessment: Overall WFL for tasks assessed   Cervical / Trunk Assessment Cervical / Trunk Assessment: Normal   Communication Communication Communication: HOH   Cognition Arousal/Alertness: Awake/alert Behavior During Therapy: WFL for tasks assessed/performed Overall Cognitive Status: Impaired/Different from baseline Area of Impairment: Memory;Attention                   Current Attention Level: Sustained Memory: Decreased short-term memory         General Comments: A/Ox4; pt able to recall given name/address immediately, but not after 5 mins- only partial ability recalling name, no memory of address. Pt ablet o count backwards 20-1; spell 'world' backwards and recognize the time within 1 minute of actual time with no cues.   General Comments  VSS on  RA.    Exercises     Shoulder Instructions      Home Living Family/patient expects to be discharged to:: Private residence Living Arrangements: Spouse/significant other Available Help at Discharge: Family Type of Home: House Home Access: Stairs to enter Technical brewer of Steps: 2   Home Layout: One level     Bathroom Shower/Tub: Walk-in shower         Home Equipment: Grab bars - tub/shower          Prior Functioning/Environment Level of Independence: Independent        Comments: Retired Public relations account executive Problem List: Decreased cognition;Decreased safety  awareness      OT Treatment/Interventions: Cognitive remediation/compensation;Balance training;Patient/family education;Self-care/ADL training;Neuromuscular education    OT Goals(Current goals can be found in the care plan section) Acute Rehab OT Goals Patient Stated Goal: to go home OT Goal Formulation: With patient Time For Goal Achievement: 11/14/19 Potential to Achieve Goals: Good ADL Goals Additional ADL Goal #1: Pt will increase recall (of simple information) after 5 mins of initial info with minimal cues and 90% accuracy in 2/2 trials. Additional ADL Goal #2: Pt to perform multi step commands for higher level cog with higher level ADL with supervisionA and no cueing.  OT Frequency: Min 2X/week   Barriers to D/C:            Co-evaluation              AM-PAC OT "6 Clicks" Daily Activity     Outcome Measure Help from another person eating meals?: None Help from another person taking care of personal grooming?: A Little Help from another person toileting, which includes using toliet, bedpan, or urinal?: A Little Help from another person bathing (including washing, rinsing, drying)?: None Help from another person to put on and taking off regular upper body clothing?: None Help from another person to put on and taking off regular lower body clothing?: None 6 Click Score: 22   End of Session Equipment Utilized During Treatment: Gait belt Nurse Communication: Mobility status;Other (comment)(need for bed alarm as it would not set and ECHO coming in)  Activity Tolerance: Patient tolerated treatment well Patient left: in bed;with call bell/phone within reach;Other (comment)(ECHO coming in)  OT Visit Diagnosis: Unsteadiness on feet (R26.81);Muscle weakness (generalized) (M62.81);Other symptoms and signs involving cognitive function                Time: [id number] OT Time Calculation (min): 17 min Charges:  OT General Charges $OT Visit: 1 Visit OT Evaluation $OT Eval  Moderate Complexity: 1 Mod  Jefferey Pica, OTR/L Acute Rehabilitation Services Pager: [phone] Office: [phone] C 10/31/2019, 2:03 PM

## 2019-10-31 NOTE — Progress Notes (Signed)
PT Cancellation Note  Patient Details Name: Joe Baise, MD MRN: [id number] DOB: June 18, 1941   Cancelled Treatment:    Reason Eval/Treat Not Completed: Active bedrest order     Wyona Almas, PT, DPT Acute Rehabilitation Services Pager [phone] Office [phone]    Deno Etienne 10/31/2019, 7:43 AM

## 2019-10-31 NOTE — Progress Notes (Signed)
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 The wife reports that the had gotten into bed to get into their nightly ritual of sleeping when he suddenly complained that he feels tingling on his left arm.  He tried to shake the left arm off as if he could check off a sleeping arm but sensation did not go away.  Soon after he started noting his left leg also is feeling numb.  The wife suggested that she will call 911 but he refused EMS to be called.  He does started complaining of his face becoming numb and that is when she decided to get in the car and drove him to the emergency room.  He was seen initially at the ER triage and a code stroke was called after the ED providers had assessed him emergently there.  I saw the patient in the CT scanner. On my initial examination, his NIH stroke scale was 1 that she got the month wrong. As I completed the CT scan and reevaluated him, his left-sided numbness and weakness had started to become worse and his NIH stroke scale increased to a total of 5.  See details in the exam section below. Discussed risks and benefits of IV TPA at that time and IV TPA was pushed at 2043 hrs. Vessel studies were obtained after TPA administration and did not show any emergent large vessel occlusion.  See detailed read below. Never had a stroke in the past.  No history of heart attacks. 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 He has received the first shot of Covid vaccine 2 weeks ago and is due for the second dose in the coming 2 weeks. LKW: 7:45 PM on 10/30/2019 tpa given?:  Yes Premorbid modified Rankin scale (mRS): 1-some difficulty walking due to arthritis   INTERVAL HISTORY His nurse is at bedside, he is alert, no dysarthria, no  weakness, symptoms resolved.    OBJECTIVE Vitals:   10/31/19 0300 10/31/19 0330 10/31/19 0400 10/31/19 0430  BP: (!) 141/83 (!) 149/106 135/86 (!) 142/81  Pulse: 81 79 79 74  Resp: 20 19    Temp:   98.3 F (36.8 C)   TempSrc:   Oral   SpO2: 97% 98% 96% 96%  Weight:      Height:        CBC:  Recent Labs  Lab 10/30/19 2022 10/30/19 2028  WBC 11.3*  --   NEUTROABS 3.6  --   HGB 16.7 17.0  HCT 52.0 50.0  MCV 96.3  --   PLT 216  --     Basic Metabolic Panel:  Recent Labs  Lab 10/30/19 2022 10/30/19 2028  NA 140 140  K 4.7 3.9  CL 107 109  CO2 23  --   GLUCOSE 104* 99  BUN 18 20  CREATININE 1.22 1.10  CALCIUM 9.1  --     Lipid Panel:     Component Value Date/Time   CHOL 209 (H) 10/30/2019 2348   TRIG 128 10/30/2019 2348   HDL 32 (L) 10/30/2019 2348   CHOLHDL 6.5 10/30/2019 2348   VLDL 26 10/30/2019 2348   LDLCALC 151 (H) 10/30/2019 2348   HgbA1c:  Lab Results  Component Value Date   HGBA1C 5.4 10/30/2019  Urine Drug Screen: No results found for: LABOPIA, COCAINSCRNUR, LABBENZ, AMPHETMU, THCU, LABBARB  Alcohol Level     Component Value Date/Time   ETH <10 10/30/2019 2022    IMAGING  CT Code Stroke CTA Head W/WO contrast CT Code Stroke CTA Neck W/WO contrast CT Code Stroke Cerebral Perfusion with contrast 10/30/2019 IMPRESSION:  1. No emergent large vessel occlusion.  2. No significant carotid stenosis in the neck.  3. 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  4. 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  5. Segmental narrowing throughout ACA, MCA, and PCA branch vessels compatible with intracranial atherosclerotic change.  6. Aortic Atherosclerosis (ICD10-I70.0).  7. Atherosclerotic changes at the carotid bifurcations bilaterally and at the cavernous internal carotid arteries without significant stenosis.   CT HEAD CODE STROKE WO CONTRAST 10/30/2019 IMPRESSION:  1.  No acute intracranial abnormality  2. Mild diffuse subcortical white matter hypoattenuation is likely related to chronic microvascular ischemia.  3. Atherosclerosis.  4. ASPECTS is 10/10   MR Brain WO Contrast - pending  Transthoracic Echocardiogram  00/00/2021 Pending  ECG - SR rate 92 BPM. (See cardiology reading for complete details)  PHYSICAL EXAM Blood pressure (!) 142/81, pulse 74, temperature 98.3 F (36.8 C), temperature source Oral, resp. rate 19, height 5\' 11"  (1.803 m), weight 98.9 kg, SpO2 96 %.  Exam: NAD, pleasant                  Speech:    Speech is normal; fluent and spontaneous with normal comprehension. Can repeat and name. Cognition:    The patient is oriented to person, place, and time;     recent and remote memory intact;     language fluent;    Cranial Nerves:    The pupils are equal, round, and reactive to light.EOMI. Trigeminal sensation is intact and the muscles of mastication are normal. The face is symmetric. The palate elevates in the midline. Hearing intact. Voice is normal. Shoulder shrug is normal. The tongue has normal motion without fasciculations.   Coordination:  No dysmetria  Motor Observation:    No asymmetry, no atrophy, and no involuntary movements noted. Tone:    Normal muscle tone.     Strength:    Strength is V/V in the upper and lower limbs.      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 The patient received IV t-PA at 2100 on Saturday 10/30/19  Stroke:  MRI pending  Resultant  Symptoms resolved  Code Stroke CT Head - No acute intracranial abnormality. Mild diffuse subcortical white matter hypoattenuation is likely related to chronic microvascular ischemia. Atherosclerosis. ASPECTS is 10/10   CT head - not ordered  MRI head - pending  MRA head - not ordered  CTA H&N - No emergent large vessel occlusion. No  significant carotid stenosis in the neck. 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   CT Perfusion - ASPECTS: 10/10. CBF (<30%) Volume: 34mL. Perfusion (Tmax>6.0s) volume: 25mL. Mismatch Volume: 43mL. Infarction Location:None  Carotid Doppler - CTA neck performed - carotid dopplers not indicated.  2D Echo - pending  Lacey Jensen Virus 2 - negative  LDL - 151  HgbA1c - 5.4  UDS -  pending  VTE prophylaxis - SCDs Diet  Diet Order            Diet NPO time specified  Diet effective now               No antithrombotic prior to admission, now on No antithrombotic s/p tPA  Patient will be counseled to be compliant with his antithrombotic medications  Ongoing aggressive stroke risk factor management  Therapy recommendations:  pending  Disposition:  Pending  Hypertension  Home BP meds: none   Current BP meds: labetalol and Cleviprex prn  Stable . Permissive hypertension (OK if < 220/120) but gradually normalize in 5-7 days (initially keep SBP <180 after tPA) . Long-term BP goal normotensive  Hyperlipidemia  Home Lipid lowering medication: none   LDL 151, goal < 70  Current lipid lowering medication: none   Hx of statin intolerance  Other Stroke Risk Factors  Advanced age  Former cigarette smoker - quit  Obesity, Body mass index is 30.41 kg/m., recommend weight loss, diet and exercise as appropriate   Family hx stroke (maternal grandmother)  Other Active Problems  Code status - Full Code  Aortic Atherosclerosis (ICD10-I70.0)  Mild Leukocytosis - 11.3 (afebrile)  Arthritis  Hospital day # 1  Personally examined patient and images, and have participated in and made any corrections needed to history, physical, neuro exam,assessment and plan as stated above.  I have personally obtained the history, evaluated lab date, reviewed imaging  studies and agree with radiology interpretations.    Sarina Ill, MD Stroke Neurology   A total of 35 minutes was spent for the care of this patient, spent on counseling patient and family on different diagnostic and therapeutic options, counseling and coordination of care, riskd ans benefits of management, compliance, or risk factor reduction and education.   To contact Stroke Continuity provider, please refer to [email or web address] After hours, contact General Neurology

## 2019-10-31 NOTE — Evaluation (Addendum)
Physical Therapy Evaluation Patient Details Name: Joe Dralle, MD MRN: [id number] DOB: December 08, 1940 Today's Date: 10/31/2019   History of Present Illness  Pt is a 79 y.o. M with significant PMH of arthritis who presents with sudden onset of left sided numbess. IV TPA pushed at 2043 hrs. CT head with no acute intracranial abnormality. CTA with no emergent LVO. MRI pending.  Clinical Impression  Pt admitted with above. 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 Pt reports no residual deficits including resolution of left sided numbness. Pt presents with mild dynamic balance impairments and decreased cognition. Ambulating 250 feet with no assistive device at a min guard assist level. Don't anticipate need for follow up PT; will continue to follow acutely.     Follow Up Recommendations No PT follow up;Supervision/Assistance - 24 hour    Equipment Recommendations  None recommended by PT    Recommendations for Other Services       Precautions / Restrictions Precautions Precautions: Fall Restrictions Weight Bearing Restrictions: No      Mobility  Bed Mobility               General bed mobility comments: OOB in chair  Transfers Overall transfer level: Needs assistance Equipment used: None Transfers: Sit to/from Stand Sit to Stand: Supervision            Ambulation/Gait Ambulation/Gait assistance: Min guard Gait Distance (Feet): 250 Feet Assistive device: None Gait Pattern/deviations: Step-through pattern;Decreased dorsiflexion - left     General Gait Details: Decreased left foot clearance, min guard for stability. Good pace, able to perform high level balance activities with no overt LOB  Stairs            Wheelchair Mobility    Modified Rankin (Stroke Patients Only) Modified Rankin (Stroke Patients Only) Pre-Morbid Rankin Score: No symptoms Modified Rankin: Moderately severe disability     Balance Overall balance  assessment: Mild deficits observed, not formally tested                                           Pertinent Vitals/Pain Pain Assessment: No/denies pain    Home Living Family/patient expects to be discharged to:: Private residence Living Arrangements: Spouse/significant other Available Help at Discharge: Family Type of Home: House Home Access: Stairs to enter   Technical brewer of Steps: 2 Home Layout: One level Home Equipment: Grab bars - tub/shower      Prior Function Level of Independence: Independent         Comments: Retired Journalist, newspaper        Extremity/Trunk Assessment   Upper Extremity Assessment Upper Extremity Assessment: Defer to OT evaluation    Lower Extremity Assessment Lower Extremity Assessment: Overall WFL for tasks assessed    Cervical / Trunk Assessment Cervical / Trunk Assessment: Normal  Communication   Communication: HOH  Cognition Arousal/Alertness: Awake/alert Behavior During Therapy: WFL for tasks assessed/performed Overall Cognitive Status: Impaired/Different from baseline Area of Impairment: Memory;Attention                   Current Attention Level: Sustained Memory: Decreased short-term memory         General Comments: A&Ox4. Pt with 0/3 short term recall, 2/3 with cues. Needs to be redirected to topic at hand i.e. when asked why he moved to [place], pt starts  talking about a friend he has from [place]. Pt wife states this is new.      General Comments      Exercises     Assessment/Plan    PT Assessment Patient needs continued PT services  PT Problem List Decreased balance;Decreased mobility;Decreased cognition;Decreased safety awareness       PT Treatment Interventions Gait training;Stair training;Functional mobility training;Therapeutic activities;Therapeutic exercise;Patient/family education;Balance training    PT Goals (Current goals can be found in the Care Plan section)   Acute Rehab PT Goals Patient Stated Goal: to go home PT Goal Formulation: With patient/family Time For Goal Achievement: 11/14/19 Potential to Achieve Goals: Good    Frequency Min 4X/week   Barriers to discharge        Co-evaluation               AM-PAC PT "6 Clicks" Mobility  Outcome Measure Help needed turning from your back to your side while in a flat bed without using bedrails?: None Help needed moving from lying on your back to sitting on the side of a flat bed without using bedrails?: None Help needed moving to and from a bed to a chair (including a wheelchair)?: None Help needed standing up from a chair using your arms (e.g., wheelchair or bedside chair)?: None Help needed to walk in hospital room?: A Little Help needed climbing 3-5 steps with a railing? : A Little 6 Click Score: 22    End of Session Equipment Utilized During Treatment: Gait belt Activity Tolerance: Patient tolerated treatment well Patient left: in chair;with call bell/phone within reach;Other (comment)(with OT) Nurse Communication: Mobility status PT Visit Diagnosis: Unsteadiness on feet (R26.81);Other abnormalities of gait and mobility (R26.89);Other symptoms and signs involving the nervous system (R29.898)    Time: 1044-1100 PT Time Calculation (min) (ACUTE ONLY): 16 min   Charges:   PT Evaluation $PT Eval Low Complexity: [place], PT, DPT Acute Rehabilitation Services Pager [phone] Office [phone]   Deno Etienne 10/31/2019, 11:17 AM

## 2019-11-01 DIAGNOSIS — E782 Mixed hyperlipidemia: Secondary | ICD-10-CM

## 2019-11-01 DIAGNOSIS — I10 Essential (primary) hypertension: Secondary | ICD-10-CM

## 2019-11-01 LAB — BASIC METABOLIC PANEL
Anion gap: 13 (ref 5–15)
BUN: 9 mg/dL (ref 8–23)
CO2: 24 mmol/L (ref 22–32)
Calcium: 9.1 mg/dL (ref 8.9–10.3)
Chloride: 105 mmol/L (ref 98–111)
Creatinine, Ser: 1.1 mg/dL (ref 0.61–1.24)
GFR calc Af Amer: >60 mL/min (ref >60)
GFR calc non Af Amer: >60 mL/min (ref >60)
Glucose, Bld: 90 mg/dL (ref 70–99)
Potassium: 3.7 mmol/L (ref 3.5–5.1)
Sodium: 142 mmol/L (ref 135–145)

## 2019-11-01 LAB — CBC
HCT: 48.7 % (ref 39.0–52.0)
Hemoglobin: 15.8 g/dL (ref 13.0–17.0)
MCH: 30.7 pg (ref 26.0–34.0)
MCHC: 32.4 g/dL (ref 30.0–36.0)
MCV: 94.7 fL (ref 80.0–100.0)
Platelets: 237 10*3/uL (ref 150–400)
RBC: 5.14 MIL/uL (ref 4.22–5.81)
RDW: 13.5 % (ref 11.5–15.5)
WBC: 10.4 10*3/uL (ref 4.0–10.5)
nRBC: 0 % (ref 0.0–0.2)

## 2019-11-01 MED ORDER — ASPIRIN EC 81 MG PO TBEC
81.0000 mg | DELAYED_RELEASE_TABLET | Freq: Every day | ORAL | Status: DC
  Administered 2019-11-01 – 2019-11-02 (×2): 81 mg via ORAL
  Filled 2019-11-01 (×2): qty 1

## 2019-11-01 MED ORDER — LORAZEPAM 2 MG/ML IJ SOLN: 1.0000 mg | Freq: Once | INTRAMUSCULAR | Status: AC

## 2019-11-01 MED ORDER — PANTOPRAZOLE SODIUM 40 MG PO TBEC
40.0000 mg | DELAYED_RELEASE_TABLET | Freq: Every day | ORAL | Status: DC
  Administered 2019-11-01 – 2019-11-02 (×2): 40 mg via ORAL
  Filled 2019-11-01 (×2): qty 1

## 2019-11-01 MED ORDER — CLOPIDOGREL BISULFATE 75 MG PO TABS
75.0000 mg | ORAL_TABLET | Freq: Every day | ORAL | Status: DC
  Administered 2019-11-01 – 2019-11-02 (×2): 75 mg via ORAL
  Filled 2019-11-01 (×2): qty 1

## 2019-11-01 MED ORDER — LORAZEPAM 2 MG/ML IJ SOLN
INTRAMUSCULAR | Status: AC
  Administered 2019-11-01: 1 mg via INTRAVENOUS
  Filled 2019-11-01: qty 1

## 2019-11-01 MED ORDER — PRAVASTATIN SODIUM 40 MG PO TABS
40.0000 mg | ORAL_TABLET | Freq: Every day | ORAL | Status: DC
  Administered 2019-11-01: 40 mg via ORAL
  Filled 2019-11-01: qty 1

## 2019-11-01 NOTE — Progress Notes (Signed)
Physical Therapy Treatment Patient Details Name: Joe Plucinski, MD MRN: [id number] DOB: [birth year] Today's Date: 11/01/2019    History of Present Illness Pt is a 79 y.o. M with significant PMH of arthritis who presents with sudden onset of left sided numbess. IV TPA pushed at 2043 hrs. CT head with no acute intracranial abnormality. CTA with no emergent LVO. 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    PT Comments    Patient much more alert this afternoon and ambulating much better. Scored 22/24 on Dynamic Gait Index which indicates he is not at incr risk for falling based on his walking performance. He continues with cognitive issues, although also much improved compared to earlier session.     Follow Up Recommendations  No PT follow up     Equipment Recommendations  None recommended by PT    Recommendations for Other Services       Precautions / Restrictions Precautions Precautions: Fall Restrictions Weight Bearing Restrictions: No    Mobility  Bed Mobility Overal bed mobility: Needs Assistance Bed Mobility: Supine to Sit     Supine to sit: Supervision     General bed mobility comments: supervision for safety; no dizziness or imbalance  Transfers Overall transfer level: Needs assistance Equipment used: None Transfers: Sit to/from Stand;Stand Pivot Transfers Sit to Stand: Min guard         General transfer comment: no imbalance or dizziness  Ambulation/Gait Ambulation/Gait assistance: Min guard;Supervision Gait Distance (Feet): 400 Feet Assistive device: None Gait Pattern/deviations: WFL(Within Functional Limits) Gait velocity: good ability to vary speed up and down   General Gait Details: no LOB (see DGI)   Stairs             Wheelchair Mobility    Modified Rankin (Stroke Patients Only) Modified Rankin (Stroke Patients Only) Pre-Morbid Rankin Score: No symptoms Modified Rankin: Moderately severe  disability     Balance Overall balance assessment: Needs assistance Sitting-balance support: No upper extremity supported;Feet supported Sitting balance-Leahy Scale: Fair     Standing balance support: During functional activity;No upper extremity supported Standing balance-Leahy Scale: Good                   Standardized Balance Assessment Standardized Balance Assessment : Dynamic Gait Index;Berg Balance Test Berg Balance Test Sit to Stand: Able to stand without using hands and stabilize independently Standing Unsupported: Able to stand safely 2 minutes Sitting with Back Unsupported but Feet Supported on Floor or Stool: Able to sit safely and securely 2 minutes Stand to Sit: Sits safely with minimal use of hands Transfers: Able to transfer safely, minor use of hands Standing Unsupported with Eyes Closed: Able to stand 10 seconds safely Standing Ubsupported with Feet Together: Able to place feet together independently and stand 1 minute safely From Standing, Reach Forward with Outstretched Arm: Can reach confidently >25 cm (10") From Standing Position, Pick up Object from Floor: Able to pick up shoe safely and easily From Standing Position, Turn to Look Behind Over each Shoulder: Looks behind from both sides and weight shifts well Turn 360 Degrees: Able to turn 360 degrees safely but slowly Standing Unsupported, One Foot in Front: Able to place foot tandem independently and hold 30 seconds Dynamic Gait Index Level Surface: Normal Change in Gait Speed: Normal Gait with Horizontal Head Turns: Mild Impairment Gait with Vertical Head Turns: Normal Gait and Pivot Turn: Normal Step Over Obstacle: Mild Impairment Step Around Obstacles: Normal Steps: Normal Total Score: 22  Cognition Arousal/Alertness: Awake/alert Behavior During Therapy: WFL for tasks assessed/performed Overall Cognitive Status: Impaired/Different from baseline Area of Impairment:  Memory;Attention;Safety/judgement                   Current Attention Level: Selective Memory: Decreased short-term memory   Safety/Judgement: Decreased awareness of safety     General Comments: instructed to walk quickly and safely with NO running; pt began jogging      Exercises      General Comments General comments (skin integrity, edema, etc.): wife present      Pertinent Vitals/Pain Pain Assessment: No/denies pain    Home Living                      Prior Function            PT Goals (current goals can now be found in the care plan section) Acute Rehab PT Goals Patient Stated Goal: to go home Time For Goal Achievement: 11/14/19 Potential to Achieve Goals: Good Progress towards PT goals: Progressing toward goals    Frequency    Min 4X/week      PT Plan Current plan remains appropriate    Co-evaluation   Reason for Co-Treatment: For patient/therapist safety;Other (comment)(Pt with increased confusion)   OT goals addressed during session: ADL's and self-care      AM-PAC PT "6 Clicks" Mobility   Outcome Measure  Help needed turning from your back to your side while in a flat bed without using bedrails?: None Help needed moving from lying on your back to sitting on the side of a flat bed without using bedrails?: A Little Help needed moving to and from a bed to a chair (including a wheelchair)?: A Little Help needed standing up from a chair using your arms (e.g., wheelchair or bedside chair)?: A Little Help needed to walk in hospital room?: A Little Help needed climbing 3-5 steps with a railing? : A Little 6 Click Score: 19    End of Session Equipment Utilized During Treatment: Gait belt Activity Tolerance: Patient limited by lethargy Patient left: in bed;with call bell/phone within reach;with bed alarm set;with family/visitor present;with SCD's reapplied Nurse Communication: Mobility status PT Visit Diagnosis: Unsteadiness on feet  (R26.81);Other abnormalities of gait and mobility (R26.89);Other symptoms and signs involving the nervous system (R29.898)     Time: [id number] PT Time Calculation (min) (ACUTE ONLY): 22 min  Charges:  $Gait Training: 8-22 mins                      Arby Barrette, PT Pager [phone]    Rexanne Mano 11/01/2019, 5:01 PM

## 2019-11-01 NOTE — Progress Notes (Signed)
STROKE TEAM PROGRESS NOTE   INTERVAL HISTORY Wife at bedside. I also talked with son over the cell phone. Pt has been doing well until this morning with agitation and confusion, received ativan 1 mg. Currently lying in bed, comfortable and clam. Transfer to floor.   OBJECTIVE Vitals:   11/01/19 0404 11/01/19 0406 11/01/19 0654 11/01/19 1121  BP: (!) 165/97 (!) 167/93 (!) 164/90 (!) 121/103  Pulse: 90 93 (!) 135   Resp: 19 (!) 23 (!) 23   Temp:      TempSrc:      SpO2: 97% 96% 98%   Weight:      Height:       CBC:  Recent Labs  Lab 10/30/19 2022 10/30/19 2022 10/30/19 2028 11/01/19 0431  WBC 11.3*  --   --  10.4  NEUTROABS 3.6  --   --   --   HGB 16.7   < > 17.0 15.8  HCT 52.0   < > 50.0 48.7  MCV 96.3  --   --  94.7  PLT 216  --   --  237   < > = values in this interval not displayed.   Basic Metabolic Panel:  Recent Labs  Lab 10/30/19 2022 10/30/19 2022 10/30/19 2028 11/01/19 0431  NA 140   < > 140 142  K 4.7   < > 3.9 3.7  CL 107   < > 109 105  CO2 23  --   --  24  GLUCOSE 104*   < > 99 90  BUN 18   < > 20 9  CREATININE 1.22   < > 1.10 1.10  CALCIUM 9.1  --   --  9.1   < > = values in this interval not displayed.   Lipid Panel:     Component Value Date/Time   CHOL 209 (H) 10/30/2019 2348   TRIG 128 10/30/2019 2348   HDL 32 (L) 10/30/2019 2348   CHOLHDL 6.5 10/30/2019 2348   VLDL 26 10/30/2019 2348   LDLCALC 151 (H) 10/30/2019 2348   HgbA1c:  Lab Results  Component Value Date   HGBA1C 5.4 10/30/2019   Urine Drug Screen: No results found for: LABOPIA, COCAINSCRNUR, LABBENZ, AMPHETMU, THCU, LABBARB  Alcohol Level     Component Value Date/Time   ETH <10 10/30/2019 2022    IMAGING CT HEAD CODE STROKE WO CONTRAST 10/30/2019 IMPRESSION:  1. No acute intracranial abnormality  2. Mild diffuse subcortical white matter hypoattenuation is likely related to chronic microvascular ischemia.  3. Atherosclerosis.  4. ASPECTS is 10/10   CT Code Stroke  CTA Head W/WO contrast CT Code Stroke CTA Neck W/WO contrast CT Code Stroke Cerebral Perfusion with contrast 10/30/2019 IMPRESSION:  1. No emergent large vessel occlusion.  2. No significant carotid stenosis in the neck.  3. 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  4. 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  5. Segmental narrowing throughout ACA, MCA, and PCA branch vessels compatible with intracranial atherosclerotic change.  6. Aortic Atherosclerosis (ICD10-I70.0).  7. Atherosclerotic changes at the carotid bifurcations bilaterally and at the cavernous internal carotid arteries without significant stenosis.   MR Brain WO Contrast  10/31/2019 1. Small focus acute infarct within the dorsolateral right thalamus, consistent with acute infarct. 2. Mild generalized volume loss and findings of chronic ischemic microangiopathy.  Transthoracic Echocardiogram  10/31/2019 1. Left ventricular ejection fraction, by estimation,  is 60 to 65%. The left ventricle has normal function. The left ventricle has no regional wall motion abnormalities. Left ventricular diastolic parameters are consistent with Grade I diastolic dysfunction (impaired relaxation).  2. Right ventricular systolic function is normal. The right ventricular size is normal.  3. The mitral valve is abnormal. Trivial mitral valve regurgitation.  4. The aortic valve is tricuspid. Aortic valve regurgitation is not visualized.  5. The inferior vena cava is normal in size with greater than 50% respiratory variability, suggesting right atrial pressure of 3 mmHg.   ECG - SR rate 92 BPM. (See cardiology reading for complete details)   PHYSICAL EXAM   Temp:  [97.6 F (36.4 C)-98.3 F (36.8 C)] 97.6 F (36.4 C) (03/15 1200) Pulse Rate:  [82-135] 135 (03/15 0654) Resp:  [16-26] 22 (03/15 1204) BP: (121-167)/(70-103) 141/70 (03/15 1204) SpO2:  [95 %-100 %] 98  % (03/15 0654)  General - Well nourished, well developed, in no apparent distress.  Ophthalmologic - fundi not visualized due to noncooperation.  Cardiovascular - Regular rhythm and rate.  Mental Status -  Level of arousal and orientation to time, place, and person were intact. Language including expression, naming, repetition, comprehension was assessed and found intact. Fund of Knowledge was assessed and was intact.  Cranial Nerves II - XII - II - Visual field intact OU. III, IV, VI - Extraocular movements intact. V - Facial sensation intact bilaterally. VII - Facial movement intact bilaterally. VIII - Hearing & vestibular intact bilaterally. X - Palate elevates symmetrically. XI - Chin turning & shoulder shrug intact bilaterally. XII - Tongue protrusion intact.  Motor Strength - The patient's strength was normal in all extremities and pronator drift was absent.  Bulk was normal and fasciculations were absent.   Motor Tone - Muscle tone was assessed at the neck and appendages and was normal.  Reflexes - The patient's reflexes were symmetrical in all extremities and he had no pathological reflexes.  Sensory - Light touch, temperature/pinprick were assessed and were symmetrical.    Coordination - The patient had normal movements in the hands and feet with no ataxia or dysmetria.  Tremor was absent.  Gait and Station - deferred.   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 The patient received IV t-PA Saturday 10/30/19 at 2100  Stroke:  R thalamic infarct s/p IV tPA, secondary to small vessel disease  Code Stroke CT Head - No acute intracranial abnormality.   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   MRI head - R thalamic infarct   2D Echo - EF 60-65%. No source of  embolus   [employer] Virus 2 - negative  LDL - 151  HgbA1c - 5.4  UDS - pending  VTE prophylaxis - SCDs  No antithrombotic prior to admission, now on aspirin 81 mg and plavix 75 mg daily x 3 weeks then aspirin alone.   Therapy recommendations:  No PT, OP OT   Disposition:  Pending  Hypertension  Home BP meds: none   Current BP meds: labetalol and Cleviprex prn  Stable . Gradually normalize in2-3 days  . Long-term BP goal normotensive  Hyperlipidemia  Home Lipid lowering medication: none   LDL 151, goal < 70  Hx of statin intolerance with zocor and lipitor  Pt is  agreeable to try pravastatin 40mg   Continue statin on d/c  Other Stroke Risk Factors  Advanced age  Former cigarette smoker - quit  Obesity, Body mass index is 30.41 kg/m., recommend weight loss, diet and exercise as appropriate   Family hx stroke (maternal grandmother)  Other Active Problems  Code status - Full Code  Aortic Atherosclerosis (ICD10-I70.0)  Mild Leukocytosis - 11.3 (afebrile)  Arthritis  Hospital day # 2  Rosalin Hawking, MD PhD Stroke Neurology 11/01/2019 1:50 PM  To contact Stroke Continuity provider, please refer to [email or web address] After hours, contact General Neurology

## 2019-11-01 NOTE — Progress Notes (Addendum)
Occupational Therapy Treatment Patient Details Name: Peace Schlechter, MD MRN: [id number] DOB: Jan 02, 1941 Today's Date: 11/01/2019    History of present illness Pt is a 79 y.o. M with significant PMH of arthritis who presents with sudden onset of left sided numbess. IV TPA pushed at 2043 hrs. CT head with no acute intracranial abnormality. CTA with no emergent LVO. 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   OT comments  Pt presenting with poor cognition and balance this session. Pt requiring Min A for functional mobility and during oral care at sink; cues for sequencing. RN informing that pt was agitated this AM and received Ativan earlier which may have impacting his participation. Will continue to follow acutely as admitted. Continue to recommend dc to home with follow up at OP OT.    Follow Up Recommendations  Outpatient OT;Supervision/Assistance - 24 hour(initially- SLP may be able to address higher level cog.)    Equipment Recommendations  None recommended by OT    Recommendations for Other Services      Precautions / Restrictions Precautions Precautions: Fall Restrictions Weight Bearing Restrictions: No       Mobility Bed Mobility Overal bed mobility: Needs Assistance Bed Mobility: Supine to Sit     Supine to sit: Supervision     General bed mobility comments: supervision for safety  Transfers Overall transfer level: Needs assistance Equipment used: None Transfers: Sit to/from Stand;Stand Pivot Transfers Sit to Stand: Min assist         General transfer comment: Min A to gain balance    Balance Overall balance assessment: Needs assistance Sitting-balance support: No upper extremity supported;Feet supported Sitting balance-Leahy Scale: Fair     Standing balance support: Bilateral upper extremity supported;During functional activity Standing balance-Leahy Scale: Poor                             ADL either  performed or assessed with clinical judgement   ADL Overall ADL's : Needs assistance/impaired     Grooming: Oral care;Minimal assistance;Standing Grooming Details (indicate cue type and reason): Pt requitinh Min A for balance                 Toilet Transfer: Minimal assistance;+2 for safety/equipment;Ambulation(simulated in room)           Functional mobility during ADLs: Minimal assistance;+2 for safety/equipment General ADL Comments: Pt supervisionA due to L some LOB episodes requiring assist for righting reactions.     Vision       Perception     Praxis      Cognition Arousal/Alertness: Lethargic;Suspect due to medications Behavior During Therapy: [facility] for tasks assessed/performed Overall Cognitive Status: Impaired/Different from baseline Area of Impairment: Memory;Attention                   Current Attention Level: Sustained Memory: Decreased short-term memory         General Comments: Pt very lethargic (possibly due to medication early this AM). Pt requiring increased cues and time throughout.         Exercises     Shoulder Instructions       General Comments Wife present throughout.    Pertinent Vitals/ Pain       Pain Assessment: No/denies pain  Home Living  Prior Functioning/Environment              Frequency  Min 2X/week        Progress Toward Goals  OT Goals(current goals can now be found in the care plan section)  Progress towards OT goals: Progressing toward goals  Acute Rehab OT Goals Patient Stated Goal: to go home OT Goal Formulation: With patient Time For Goal Achievement: 11/14/19 Potential to Achieve Goals: Good ADL Goals Additional ADL Goal #1: Pt will increase recall (of simple information) after 5 mins of initial info with minimal cues and 90% accuracy in 2/2 trials. Additional ADL Goal #2: Pt to perform multi step commands for higher level cog  with higher level ADL with supervisionA and no cueing.  Plan Discharge plan remains appropriate    Co-evaluation    PT/OT/SLP Co-Evaluation/Treatment: Yes Reason for Co-Treatment: For patient/therapist safety;Other (comment)(Pt with increased confusion)   OT goals addressed during session: ADL's and self-care      AM-PAC OT "6 Clicks" Daily Activity     Outcome Measure   Help from another person eating meals?: None Help from another person taking care of personal grooming?: A Little Help from another person toileting, which includes using toliet, bedpan, or urinal?: A Little Help from another person bathing (including washing, rinsing, drying)?: None Help from another person to put on and taking off regular upper body clothing?: None Help from another person to put on and taking off regular lower body clothing?: None 6 Click Score: 22    End of Session Equipment Utilized During Treatment: Gait belt  OT Visit Diagnosis: Unsteadiness on feet (R26.81);Muscle weakness (generalized) (M62.81);Other symptoms and signs involving cognitive function   Activity Tolerance Patient tolerated treatment well   Patient Left in bed;with call bell/phone within reach;with bed alarm set   Nurse Communication Mobility status        Time: [id number] OT Time Calculation (min): 17 min  Charges: OT General Charges $OT Visit: 1 Visit OT Treatments $Self Care/Home Management : 8-22 mins  [place], OTR/L Acute Rehab Pager: [phone] Office: [place] 11/01/2019, 1:11 PM

## 2019-11-01 NOTE — Progress Notes (Signed)
Physical Therapy Treatment  Clinical impression: Patient's balance and gait far worse this morning than on last visit. (Ultimately found out pt had been agitated earlier in the day and was given ativan--anticipate this is the cause of his worsening gait). Required up to +2 mod assist to walk to/from bathroom to complete ADL task with OT. Will plan to see again later today to further assess discharge plan.    11/01/19 1145  PT Visit Information  Last PT Received On 11/01/19  Assistance Needed +1  PT/OT/SLP Co-Evaluation/Treatment Yes  Reason for Co-Treatment For patient/therapist safety;To address functional/ADL transfers  PT goals addressed during session Mobility/safety with mobility;Balance  History of Present Illness Pt is a 79 y.o. M with significant PMH of arthritis who presents with sudden onset of left sided numbess. IV TPA pushed at 2043 hrs. CT head with no acute intracranial abnormality. CTA with no emergent LVO. 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  Subjective Data  Patient Stated Goal to go home  Precautions  Precautions Fall  Restrictions  Weight Bearing Restrictions No  Pain Assessment  Pain Assessment No/denies pain  Cognition  Arousal/Alertness Lethargic;Suspect due to medications  Behavior During Therapy [facility] for tasks assessed/performed  Overall Cognitive Status Impaired/Different from baseline  Area of Impairment Memory;Attention;Orientation  Orientation Level Place  Current Attention Level Sustained  Memory Decreased short-term memory  General Comments Pt very lethargic (possibly due to medication early this AM). Pt requiring increased cues and time throughout.   Bed Mobility  Overal bed mobility Needs Assistance  Bed Mobility Supine to Sit  Supine to sit Supervision  General bed mobility comments supervision for safety  Transfers  Overall transfer level Needs assistance  Equipment used None  Transfers Sit to/from  Stand;Stand Pivot Transfers  Sit to Stand Min assist  General transfer comment Min A to gain balance  Ambulation/Gait  Ambulation/Gait assistance Mod assist;+2 safety/equipment  Gait Distance (Feet) 15 Feet (standing task; 15)  Assistive device None  Gait Pattern/deviations Step-through pattern;Scissoring;Leaning posteriorly;Staggering left;Staggering right  General Gait Details pt twice required mod assist to regain balance due to posterior lean  Modified Rankin (Stroke Patients Only)  Pre-Morbid Rankin Score 0  Modified Rankin 4  Balance  Overall balance assessment Needs assistance  Sitting-balance support No upper extremity supported;Feet supported  Sitting balance-Leahy Scale Fair  Standing balance support During functional activity;No upper extremity supported  Standing balance-Leahy Scale Poor  Standing balance comment posterior bias  General Comments  General comments (skin integrity, edema, etc.) wife present  PT - End of Session  Equipment Utilized During Treatment Gait belt  Activity Tolerance Patient limited by lethargy  Patient left in bed;with call bell/phone within reach;with bed alarm set;with family/visitor present;with SCD's reapplied  Nurse Communication Mobility status   PT - Assessment/Plan  PT Plan Other (comment) (current deficit ?due to ativan; will reassess later today)  PT Visit Diagnosis Unsteadiness on feet (R26.81);Other abnormalities of gait and mobility (R26.89);Other symptoms and signs involving the nervous system (R29.898)  PT Frequency (ACUTE ONLY) Min 4X/week  Follow Up Recommendations Other (comment) (TBA in pm)  PT equipment None recommended by PT  AM-PAC PT "6 Clicks" Mobility Outcome Measure (Version 2)  Help needed turning from your back to your side while in a flat bed without using bedrails? 4  Help needed moving from lying on your back to sitting on the side of a flat bed without using bedrails? 3  Help needed moving to and from a bed  to  a chair (including a wheelchair)? 3  Help needed standing up from a chair using your arms (e.g., wheelchair or bedside chair)? 3  Help needed to walk in hospital room? 2  Help needed climbing 3-5 steps with a railing?  2  6 Click Score 17  Consider Recommendation of Discharge To: Home with [facility]  PT Goal Progression  Progress towards PT goals Not progressing toward goals - comment (lethargic)  Acute Rehab PT Goals  Time For Goal Achievement 11/14/19  Potential to Achieve Goals Good  PT Time Calculation  PT Start Time (ACUTE ONLY) 1113  PT Stop Time (ACUTE ONLY) 1132  PT Time Calculation (min) (ACUTE ONLY) 19 min  PT General Charges  $$ ACUTE PT VISIT 1 Visit   No charge due to 19 minute co-treatment.   Arby Barrette, PT Pager [phone]

## 2019-11-01 NOTE — Progress Notes (Signed)
Patient arrived to unit, oriented to unit and room. Belongings placed in bedside closet. A&Ox4, no pain, telemetry verified. Call bell in reach, bed in lowest position, bed alarm on.

## 2019-11-01 NOTE — Progress Notes (Signed)
SLP Cancellation Note  Patient Details Name: Joe Mesmer, MD MRN: [id number] DOB: 09/21/1940   Cancelled treatment:       Reason Eval/Treat Not Completed: Fatigue/lethargy limiting ability to participate Spoke with RN, reported pt recently received Ativan; very lethargic, and not appropriate at this time. Will continue efforts.    Aline August, Student SLP Office: [phone]  11/01/2019, 1:10 PM

## 2019-11-02 DIAGNOSIS — Z9282 Status post administration of tPA (rtPA) in a different facility within the last 24 hours prior to admission to current facility: Secondary | ICD-10-CM

## 2019-11-02 DIAGNOSIS — E669 Obesity, unspecified: Secondary | ICD-10-CM

## 2019-11-02 DIAGNOSIS — Z823 Family history of stroke: Secondary | ICD-10-CM

## 2019-11-02 LAB — CBC
HCT: 47.2 % (ref 39.0–52.0)
Hemoglobin: 15.2 g/dL (ref 13.0–17.0)
MCH: 30.5 pg (ref 26.0–34.0)
MCHC: 32.2 g/dL (ref 30.0–36.0)
MCV: 94.8 fL (ref 80.0–100.0)
Platelets: 218 10*3/uL (ref 150–400)
RBC: 4.98 MIL/uL (ref 4.22–5.81)
RDW: 13.7 % (ref 11.5–15.5)
WBC: 9.9 10*3/uL (ref 4.0–10.5)
nRBC: 0 % (ref 0.0–0.2)

## 2019-11-02 LAB — BASIC METABOLIC PANEL
Anion gap: 11 (ref 5–15)
BUN: 10 mg/dL (ref 8–23)
CO2: 25 mmol/L (ref 22–32)
Calcium: 9.1 mg/dL (ref 8.9–10.3)
Chloride: 107 mmol/L (ref 98–111)
Creatinine, Ser: 1.09 mg/dL (ref 0.61–1.24)
GFR calc Af Amer: >60 mL/min (ref >60)
GFR calc non Af Amer: >60 mL/min (ref >60)
Glucose, Bld: 100 mg/dL — ABNORMAL HIGH (ref 70–99)
Potassium: 3.9 mmol/L (ref 3.5–5.1)
Sodium: 143 mmol/L (ref 135–145)

## 2019-11-02 MED ORDER — PRAVASTATIN SODIUM 40 MG PO TABS: 40.0000 mg | ORAL_TABLET | Freq: Every day | ORAL | 2 refills | Status: DC

## 2019-11-02 MED ORDER — ASPIRIN 81 MG PO TBEC: 81.0000 mg | DELAYED_RELEASE_TABLET | Freq: Every day | ORAL | Status: AC

## 2019-11-02 MED ORDER — CLOPIDOGREL BISULFATE 75 MG PO TABS: 75.0000 mg | ORAL_TABLET | Freq: Every day | ORAL | 0 refills | Status: AC

## 2019-11-02 NOTE — Discharge Summary (Addendum)
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   aspirin 81 MG EC tablet Take 1 tablet (81 mg total) by mouth daily. Start taking on: November 03, 2019   Joe-Emu Maximum Strength 2.5 % Liqd Generic drug: Menthol (Topical Analgesic) Apply 1 application topically 2 (two) times daily as needed (hip pain).   clopidogrel 75 MG tablet Commonly known as: PLAVIX Take 1 tablet (75 mg total) by mouth daily for 21 days.   diphenhydramine-acetaminophen 25-500 MG Tabs tablet Commonly known as: TYLENOL PM Take 1 tablet by mouth at bedtime.   pravastatin 40 MG tablet Commonly known as: PRAVACHOL Take 1 tablet (40 mg total) by mouth daily at 6 PM.   vitamin E 1000 UNIT capsule Take 1,000 Units by mouth daily with breakfast.   Voltaren 1 % Gel Generic drug: diclofenac Sodium Apply 1 application topically 2 (two) times daily as needed (hip pain).       LABORATORY STUDIES CBC    Component Value Date/Time   WBC 9.9 11/02/2019 0336   RBC 4.98 11/02/2019 0336   HGB 15.2 11/02/2019 0336   HCT 47.2 11/02/2019 0336   PLT 218 11/02/2019 0336   MCV 94.8 11/02/2019 0336    MCH 30.5 11/02/2019 0336   MCHC 32.2 11/02/2019 0336   RDW 13.7 11/02/2019 0336   LYMPHSABS 6.0 (H) 10/30/2019 2022   MONOABS 1.1 (H) 10/30/2019 2022   EOSABS 0.5 10/30/2019 2022   BASOSABS 0.0 10/30/2019 2022   CMP    Component Value Date/Time   NA 143 11/02/2019 0336   K 3.9 11/02/2019 0336   CL 107 11/02/2019 0336   CO2 25 11/02/2019 0336   GLUCOSE 100 (H) 11/02/2019 0336   BUN 10 11/02/2019 0336   CREATININE 1.09 11/02/2019 0336   CALCIUM 9.1 11/02/2019 0336   PROT 7.3 10/30/2019 2022   ALBUMIN 4.2 10/30/2019 2022   AST 32 10/30/2019 2022   ALT 26 10/30/2019 2022   ALKPHOS 57 10/30/2019 2022   BILITOT 0.5 10/30/2019 2022   GFRNONAA >60 11/02/2019 0336   GFRAA >60 11/02/2019 0336   COAGS Lab Results  Component Value Date   INR 1.0 10/30/2019   Lipid Panel    Component Value Date/Time   CHOL 209 (H) 10/30/2019 2348   TRIG 128 10/30/2019 2348   HDL 32 (L) 10/30/2019 2348   CHOLHDL 6.5 10/30/2019 2348   VLDL 26 10/30/2019 2348   LDLCALC 151 (H) 10/30/2019 2348   HgbA1C  Lab Results  Component Value Date  HGBA1C 5.4 10/30/2019   Alcohol Level    Component Value Date/Time   [facility] <10 10/30/2019 2022    SIGNIFICANT DIAGNOSTIC STUDIES CT HEAD CODE STROKE WO CONTRAST 10/30/2019 IMPRESSION:  1. No acute intracranial abnormality  2. Mild diffuse subcortical white matter hypoattenuation is likely related to chronic microvascular ischemia.  3. Atherosclerosis.  4. ASPECTS is 10/10   CT Code Stroke CTA Head W/WO contrast CT Code Stroke CTA Neck W/WO contrast CT Code Stroke Cerebral Perfusion with contrast 10/30/2019 IMPRESSION:  1. No emergent large vessel occlusion.  2. No significant carotid stenosis in the neck.  3. 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  4. 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  5. Segmental narrowing throughout ACA, MCA, and PCA branch vessels compatible  with intracranial atherosclerotic change.  6. Aortic Atherosclerosis (ICD10-I70.0).  7. Atherosclerotic changes at the carotid bifurcations bilaterally and at the cavernous internal carotid arteries without significant stenosis.   MR Brain WO Contrast  10/31/2019 1. Small focus acute infarct within the dorsolateral right thalamus, consistent with acute infarct. 2. Mild generalized volume loss and findings of chronic ischemic microangiopathy.  Transthoracic Echocardiogram  10/31/2019 1. Left ventricular ejection fraction, by estimation, is 60 to 65%. The left ventricle has normal function. The left ventricle has no regional wall motion abnormalities. Left ventricular diastolic parameters are consistent with Grade I diastolic dysfunction (impaired relaxation).  2. Right ventricular systolic function is normal. The right ventricular size is normal.  3. The mitral valve is abnormal. Trivial mitral valve regurgitation.  4. The aortic valve is tricuspid. Aortic valve regurgitation is not visualized.  5. The inferior vena cava is normal in size with greater than 50% respiratory variability, suggesting right atrial pressure of 3 mmHg.   ECG - SR rate 92 BPM. 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 The wife reports that the had gotten into bed to get into their nightly ritual of sleeping when he suddenly complained that he feels tingling on his left arm.  He tried to shake the left arm off as if he could check off a sleeping arm but sensation did not go away.  Soon after he started noting his left leg also is feeling numb.  The wife suggested that she will call 911 but he refused EMS to be called.  He does started complaining of his face becoming numb and that is  when she decided to get in the car and drove him to the emergency room.  He was seen initially at the ER triage and a code stroke was called after the ED providers had assessed him emergently there. Dr. Rory Percy saw the patient in the CT scanner. On his initial examination, his NIH stroke scale was 1 that she got the month wrong. As he completed the CT scan and reevaluated him, his left-sided numbness and weakness had started to become worse and his NIH stroke scale increased to a total of 5.  Discussed risks and benefits of IV TPA at that time and IV TPA was administered. Vessel studies were obtained after TPA administration and did not show any emergent large vessel occlusion.  Never had a stroke in the past.  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 He has received the first shot of Covid vaccine 2 weeks ago and is due for the second dose in the coming 2 weeks. Premorbid modified Rankin scale (mRS): 1-some difficulty walking due to arthritis.  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 The patient received IV t-PA Saturday 10/30/19 at 2100  Stroke:  R thalamic infarct s/p IV tPA, secondary to small vessel disease  Code Stroke CT Head - No acute intracranial abnormality.   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   MRI head - R thalamic infarct   2D Echo - EF 60-65%. No source of embolus   [employer] Virus 2 - negative  LDL - 151  HgbA1c - 5.4  No antithrombotic prior to admission, now on aspirin 81 mg and plavix 75 mg daily x 3 weeks then aspirin alone.   Therapy recommendations:  No PT, OP OT, OP SLP -> HH OT and SLP arranged  Disposition:  d/c home  Hyperlipidemia  Home Lipid lowering medication: none   LDL 151, goal < 70  Hx of statin  intolerance with zocor and lipitor  Pt is agreeable to try pravastatin 40mg   Continue statin on d/c  Other Stroke Risk Factors  Advanced age  Former cigarette smoker - quit  Obesity, Body mass index is 30.41 kg/m. recommend weight loss, diet and exercise as appropriate   Family hx stroke (maternal grandmother)  Other Active Problems  Aortic Atherosclerosis   Mild Leukocytosis - 11.3 (afebrile)  Arthritis  DISCHARGE EXAM Blood pressure (!) 149/91, pulse 90, temperature 98.1 F (36.7 C), temperature source Oral, resp. rate 18, height 5\' 11"  (1.803 m), weight 98.9 kg, SpO2 98 %. General - Well nourished, well developed, in no apparent distress.  Ophthalmologic - fundi not visualized due to noncooperation.  Cardiovascular - Regular rhythm and rate.  Mental Status -  Level of arousal and orientation to time, place, and person were intact. Language including expression, naming, repetition, comprehension was assessed and found intact. Fund of Knowledge was assessed and was intact.  Cranial Nerves II - XII - II - Visual field intact OU. III, IV, VI - Extraocular movements intact. V - Facial sensation intact bilaterally. VII - Facial movement intact bilaterally. VIII - Hearing & vestibular intact bilaterally. X - Palate elevates symmetrically. XI - Chin turning & shoulder shrug intact bilaterally. XII - Tongue protrusion intact.  Motor Strength - The patient's strength was normal in all extremities and pronator drift was absent.  Bulk was normal and fasciculations were absent.   Motor Tone - Muscle tone was assessed at the neck and appendages and was normal.  Reflexes - The patient's reflexes were symmetrical in all extremities and he had no pathological reflexes.  Sensory - Light touch, temperature/pinprick were assessed and were symmetrical.    Coordination - The patient had normal movements in the hands and feet with no ataxia or dysmetria.  Tremor was  absent.  Gait and Station - deferred.  Discharge Diet   Heart healthy thin liquids  DISCHARGE PLAN  Disposition:  Return home  aspirin 81 mg daily and clopidogrel 75 mg daily for secondary stroke prevention for 3 weeks then aspirin alone.  Ongoing stroke risk factor control by  Primary Care Physician at time of discharge  Follow-up PCP Quay Burow, Claudina Lick, MD in 2 weeks.  Follow-up in [facility] in 4 weeks, office to schedule an appointment.   35 minutes were spent preparing discharge.  Rosalin Hawking, MD PhD Stroke Neurology 11/03/2019 12:02 AM

## 2019-11-02 NOTE — Progress Notes (Signed)
Occupational Therapy Treatment Patient Details Name: Joe Sandelin, MD MRN: [id number] DOB: Jan 01, 1941 Today's Date: 11/02/2019    History of present illness Pt is a 79 y.o. M with significant PMH of arthritis who presents with sudden onset of left sided numbess. IV TPA pushed at 2043 hrs. CT head with no acute intracranial abnormality. CTA with no emergent LVO. 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   OT comments  Pt making steady progress however demonstrates impaired cognition affecting his judgement/insight into deficits and his safety with ADL and IADL tasks. Recommend direct S with medication management and for pt to refrain from driving at this time. Pt/wfie verbalized understanding.  Recommend follow up OT after DC.   Follow Up Recommendations  Outpatient OT;Supervision/Assistance - 24 hour    Equipment Recommendations  None recommended by OT    Recommendations for Other Services      Precautions / Restrictions Precautions Precautions: Fall       Mobility Bed Mobility Overal bed mobility: Modified Independent                Transfers Overall transfer level: Needs assistance   Transfers: Sit to/from Stand;Stand Pivot Transfers Sit to Stand: Supervision Stand pivot transfers: Supervision            Balance Overall balance assessment: Needs assistance   Sitting balance-Leahy Scale: Good       Standing balance-Leahy Scale: Fair Standing balance comment: Note unsteady at times however pt ableto regain balance independently                           ADL either performed or assessed with clinical judgement   ADL Overall ADL's : Needs assistance/impaired                                     Functional mobility during ADLs: Supervision/safety General ADL Comments: Pt overall S for ADL management. Educated pt/wife on iportance of sitting as much as possible to dress and bath to reduce risk  of falls. Educated on other strategies to reduce risk of falls. Wife reports pt has had falls PTA.      Vision   Additional Comments: will further assess   Perception     Praxis      Cognition Arousal/Alertness: Awake/alert Behavior During Therapy: Impulsive Overall Cognitive Status: Impaired/Different from baseline Area of Impairment: Attention;Memory;Safety/judgement;Awareness;Problem solving                   Current Attention Level: Selective Memory: Decreased short-term memory;Decreased recall of precautions   Safety/Judgement: Decreased awareness of safety;Decreased awareness of deficits Awareness: Emergent Problem Solving: Slow processing General Comments: Pt given 3 step trail making task. when asked to repeat task, pt unable to recall room number he was to find and said "find a room". PT did not use environment al cues/signage to find room adn instead "wondered" until he found room number. Poor awareness of space adn self during activity. Educated pt on warning signs.symptoms of CVA using [employer]. Pt unable to recall information and began tangentially talking about tasks that he felt were pertinent to CVA. Pt unable to recall that he was starting new medication since haivn a CVA. Poor insight into deficits        Exercises     Shoulder Instructions       General Comments  Pertinent Vitals/ Pain       Pain Assessment: No/denies pain  Home Living     Available Help at Discharge: Family Type of Home: House                              Lives With: Spouse    Prior Functioning/Environment              Frequency  Min 2X/week        Progress Toward Goals  OT Goals(current goals can now be found in the care plan section)  Progress towards OT goals: Progressing toward goals  Acute Rehab OT Goals Patient Stated Goal: to go home OT Goal Formulation: With patient Time For Goal Achievement: 11/14/19 Potential to Achieve Goals:  Good ADL Goals Additional ADL Goal #1: Pt will increase recall (of simple information) after 5 mins of initial info with minimal cues and 90% accuracy in 2/2 trials. Additional ADL Goal #2: Pt to perform multi step commands for higher level cog with higher level ADL with supervisionA and no cueing.  Plan Discharge plan remains appropriate    Co-evaluation                 AM-PAC OT "6 Clicks" Daily Activity     Outcome Measure   Help from another person eating meals?: None Help from another person taking care of personal grooming?: A Little Help from another person toileting, which includes using toliet, bedpan, or urinal?: A Little Help from another person bathing (including washing, rinsing, drying)?: A Little Help from another person to put on and taking off regular upper body clothing?: A Little Help from another person to put on and taking off regular lower body clothing?: A Little 6 Click Score: 19    End of Session Equipment Utilized During Treatment: Gait belt  OT Visit Diagnosis: Unsteadiness on feet (R26.81);Muscle weakness (generalized) (M62.81);Other symptoms and signs involving cognitive function;History of falling (Z91.81)   Activity Tolerance Patient tolerated treatment well   Patient Left in bed;with call bell/phone within reach;with chair alarm set;with family/visitor present   Nurse Communication Mobility status;Other (comment)(need to refrain from driving)        Time: [id number] OT Time Calculation (min): 29 min  Charges: OT General Charges $OT Visit: 1 Visit OT Treatments $Self Care/Home Management : 23-37 mins  Maurie Boettcher, OT/L   Acute OT Clinical Specialist [place] Pager [phone] Office [phone]    [facility] 11/02/2019, 2:26 PM

## 2019-11-02 NOTE — Evaluation (Signed)
Speech Language Pathology Evaluation Patient Details Name: Joe Trani, MD MRN: [id number] DOB: 21-May-1941 Today's Date: 11/02/2019 Time: [id number] SLP Time Calculation (min) (ACUTE ONLY): 33 min  Problem List:  Patient Active Problem List   Diagnosis Date Noted  . Acute ischemic stroke ([place]) 10/30/2019  . Localized osteoarthrosis of right hip, mod-sev 07/22/2019  . Otitis media 04/12/2019  . Otitis externa 04/12/2019  . Digital mucinous cyst of finger 02/09/2019  . Chronic pain of right knee 06/17/2018  . Wears hearing aid 07/04/2016  . 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 Hyperlipidemia 06/29/2016  . 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 Vitamin D deficiency 06/29/2016  . BPH (benign prostatic hyperplasia) 06/29/2016   Past Medical History: History reviewed. No pertinent past medical history. Past Surgical History:  Past Surgical History:  Procedure Laterality Date  . CHOLECYSTECTOMY    . INGUINAL HERNIA REPAIR Right 01/24/2017   Procedure: LAPAROSCOPIC RIGHT INGUINAL HERNIA WITH MESH;  Surgeon: Ralene Ok, MD;  Location: [place];  Service: General;  Laterality: Right;  . INSERTION OF MESH Right 01/24/2017   Procedure: INSERTION OF MESH;  Surgeon: Ralene Ok, MD;  Location: [place];  Service: General;  Laterality: Right;   HPI:  Pt is a 79 y.o. M with significant PMH of arthritis who presents with sudden onset of left sided numbness. IV TPA pushed at 2043 hrs. CT head with no acute intracranial abnormality. CTA with no emergent LVO. 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    Assessment / Plan / Recommendation Clinical Impression  Pt was seen for a cognitive-linguistic evaluation in the setting of a small infarct within the dorsolateral right thalamus.  Pt was encountered awake/alert with his wife present at bedside.  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  His wife stated that she manages all of  their finances and medications at home.  Pt completed the [facility] Cognitive Assessment [facility]) and he demonstrated a significant deficit in short-term memory and a mild deficit in executive functioning.  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  See full results below for more information.  Expressive and receptive language were functional and no dysarthria was observed.  Spoke with pt and wife in depth regarding the results of the [facility] and recommendations that pt continue to receive assistance with IADLs (meds, finances, etc.) at time of discharge.  Suspect that pt had some memory deficits at baseline, but pt/wife were unable to confirm.  Pt/wife were educated regarding short-term memory strategies and simple memory exercises.  They verbalized understanding of all results and recommendations.  Plan is for pt to discharge home today, therefore SLP will s/o at this time.       [place] Cognitive Assessment [facility])  Visuospatial/Executive function  4/5  Naming 3/3  Memory  0/5  Attention 5/6  Language  3/3  Abstraction 2/2  Orientation 4/6  Total  21/30  Norm is >/= 26/30      SLP Assessment  SLP Recommendation/Assessment: All further Speech Lanaguage Pathology  needs can be addressed in the next venue of care(Pt is discharging home today. ) SLP Visit Diagnosis: Cognitive communication deficit (R41.841)    Follow Up Recommendations  Supervision and assistance with IADLs (meds, finances, etc.)              SLP Evaluation Cognition  Overall Cognitive Status: Impaired/Different from baseline Arousal/Alertness: Awake/alert Orientation Level: Oriented X4 Attention: Sustained Sustained Attention: Appears intact Memory:  Impaired Memory Impairment: Storage deficit;Retrieval deficit;Decreased short term memory;Decreased recall of new information;Decreased long term memory Decreased Long Term Memory: Functional complex Decreased Short Term Memory:  Verbal basic Awareness: Impaired Awareness Impairment: Intellectual impairment Problem Solving: Appears intact Executive Function: Organizing Organizing: Impaired Organizing Impairment: Functional complex Safety/Judgment: Appears intact       Comprehension  Auditory Comprehension Overall Auditory Comprehension: Appears within functional limits for tasks assessed Reading Comprehension Reading Status: Not tested    Expression Expression Primary Mode of Expression: Verbal Verbal Expression Overall Verbal Expression: Appears within functional limits for tasks assessed Written Expression Dominant Hand: Right   Oral / Motor  Oral Motor/Sensory Function Overall Oral Motor/Sensory Function: Within functional limits Motor Speech Overall Motor Speech: Appears within functional limits for tasks assessed   GO                   Colin Mulders., M.S., [place] Office: [phone]  [place] 11/02/2019, 1:21 PM

## 2019-11-02 NOTE — TOC Transition Note (Addendum)
Transition of Care [facility]) - CM/SW Discharge Note   Patient Details  Name: Joe Delon, MD MRN: [id number] Date of Birth: [birth year]  Transition of Care [facility]) CM/SW Contact:  Pollie Friar, RN Phone Number: 11/02/2019, 1:27 PM   Clinical Narrative:    Recommendations are for outpatient therapy but wife prefers [facility] services. MD updated and in agreement. Pt discharging with home health services through [place]. Cory with [facility] accepted the referral.  Pt has no DME needs.  Wife is able to provide supervision at home and transportation to home.    Final next level of care: Home w Home Health Services Barriers to Discharge: No Barriers Identified   Patient Goals and CMS Choice   CMS Medicare.gov Compare Post Acute Care list provided to:: Patient Choice offered to / list presented to : Patient, Spouse  Discharge Placement                       Discharge Plan and Services                          HH Arranged: OT, Speech Therapy HH Agency: [place] Date [place]: 11/02/19 Time [place]: [id number] Representative spoke with at [place]: [place] ([place]) Interventions     Readmission Risk Interventions No flowsheet data found.

## 2019-11-04 DIAGNOSIS — Z6828 Body mass index (BMI) 28.0-28.9, adult: Secondary | ICD-10-CM | POA: Diagnosis not present

## 2019-11-04 DIAGNOSIS — H9190 Unspecified hearing loss, unspecified ear: Secondary | ICD-10-CM | POA: Diagnosis not present

## 2019-11-04 DIAGNOSIS — E669 Obesity, unspecified: Secondary | ICD-10-CM | POA: Diagnosis not present

## 2019-11-04 DIAGNOSIS — M1990 Unspecified osteoarthritis, unspecified site: Secondary | ICD-10-CM | POA: Diagnosis not present

## 2019-11-04 DIAGNOSIS — Z7902 Long term (current) use of antithrombotics/antiplatelets: Secondary | ICD-10-CM | POA: Diagnosis not present

## 2019-11-04 DIAGNOSIS — R1310 Dysphagia, unspecified: Secondary | ICD-10-CM | POA: Diagnosis not present

## 2019-11-04 DIAGNOSIS — I69315 Cognitive social or emotional deficit following cerebral infarction: Secondary | ICD-10-CM | POA: Diagnosis not present

## 2019-11-04 DIAGNOSIS — I700 Atherosclerosis of aorta: Secondary | ICD-10-CM | POA: Diagnosis not present

## 2019-11-04 DIAGNOSIS — Z9181 History of falling: Secondary | ICD-10-CM | POA: Diagnosis not present

## 2019-11-04 DIAGNOSIS — I69322 Dysarthria following cerebral infarction: Secondary | ICD-10-CM | POA: Diagnosis not present

## 2019-11-04 DIAGNOSIS — I69391 Dysphagia following cerebral infarction: Secondary | ICD-10-CM | POA: Diagnosis not present

## 2019-11-04 DIAGNOSIS — I119 Hypertensive heart disease without heart failure: Secondary | ICD-10-CM | POA: Diagnosis not present

## 2019-11-04 DIAGNOSIS — Z7982 Long term (current) use of aspirin: Secondary | ICD-10-CM | POA: Diagnosis not present

## 2019-11-04 DIAGNOSIS — Z87891 Personal history of nicotine dependence: Secondary | ICD-10-CM | POA: Diagnosis not present

## 2019-11-04 DIAGNOSIS — I69354 Hemiplegia and hemiparesis following cerebral infarction affecting left non-dominant side: Secondary | ICD-10-CM | POA: Diagnosis not present

## 2019-11-10 ENCOUNTER — Encounter: Payer: Self-pay | Admitting: *Deleted

## 2019-11-10 ENCOUNTER — Other Ambulatory Visit: Payer: Self-pay | Admitting: *Deleted

## 2019-11-10 NOTE — Patient Outreach (Signed)
[facility]) Care Management  11/10/2019  Joe Blalock, MD March 13, 1941 [id number]   Subjective: Telephone call to patient's home / mobile number, spoke with patient, states he has a hard time hearing and requested that RNCM speak with his wife/ designated party release Brita Romp) regarding his healthcare needs as needed.  Spoke with patient's wife, she stated patient's name, date of birth, and address.   Discussed [facility] Care Management BCBS Medicare EMMI Stroke Red Alert Flag follow up, wife voiced understanding, and is in agreement to follow up on patient's behalf.   States she remembers patient receiving EMMI automated calls and it captured the incorrect answer. Wife states patient is doing very well, eating well, resting well, and participating in home therapies through [facility].   States patient has a follow up visit with primary MD on 11/16/2019 and with neurologist on 12/02/2019.  Wife states patient  is able to manage self care and has assistance as needed.  Wife voices understanding of patient's medical diagnosis and treatment plan.   States she is accessing his  BCBS Medicare benefits as needed on patient's behalf via member services number on back of card.  Wife states patient  does not have any education material, EMMI follow up, care coordination, care management, disease monitoring, transportation, community resource, or pharmacy needs at this time.  States she is very appreciative of the follow up, is in agreement  to receive 1 additional follow up call to assess for further CM needs on patient's behalf, and is in agreement to receive [facility] Management EMMI follow up calls as needed.  Wife requested follow up with be after patient's neurologist follow up appointment and states she will reach out to this [facility] if assistance needed prior to next outreach.      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   Patient also has a history of hyperlipidemia.       Assessment: Received BCBS Medicare EMMI Stroke Red Flag Alert on 11/05/2019.   Red Flag Alert Trigger, Day #1, patient answered no the following question: Scheduled a follow-up appointment?   EMMI follow up completed and will follow up to assess further care management needs.     Plan: RNCM will call patient for telephone outreach attempt, within 21 business days, EMMI follow up, to assess for further CM needs, and proceed with case closure, within 10 business days if no return call.      Zvi Duplantis H. Annia Friendly, BSN, [facility] Telephonic CM Phone: [phone] Fax: [phone]

## 2019-11-12 ENCOUNTER — Telehealth: Payer: Self-pay | Admitting: Internal Medicine

## 2019-11-12 NOTE — Telephone Encounter (Signed)
° ° °  Joe Robertson from [facility] calling to report patient has BP today of 161/95. She states she encouraged patient to keep log of BP reading and bring to upcoming appointment.

## 2019-11-12 NOTE — Telephone Encounter (Signed)
Agree - will adjust medication next week

## 2019-11-15 NOTE — Progress Notes (Signed)
Subjective:    Patient ID: Joe Robertson, male    DOB: [birth year], 79 y.o.   MRN: [id number]  HPI The patient is here for follow up from the hospital.  He is here with his wife.  Admitted 10/30/19 - 11/02/19 for acute ischemic   He had sudden onset of left sided numbness.  He initially felt tingling in his left arm.  He was not able to shake it off.  His left leg then started to feel numb.  He deferred having EMS called.  His face became numb and his wife drove him to the ED.  Code stroke was called.  Neuro's initial evaluation was 1 on the NIH scale and later was 5 - his weakness had gotten worse.  IV TPA was administered.   Vessel studies after TPA did not show any emergent large vessel occlusion.    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  He denies any side effects to the medication.  His biggest concern is that he has not slept since he was in the hospital.  Prior to having the stroke he was sleeping fine.  Once he lays down he feels anxious and short of breath and he is not able to sleep.  He needs something to help him sleep.  He does feel anxious, but more so when he goes to bed, not during the day.  He denies any weakness, numbness or tingling in his arms or legs.  He denies any residuals from the CVA.  He has no difficulty speaking or eating.  He does have  speech therapy coming to the house.   His blood pressure was elevated at home last week and he has been monitoring it since then-bp at home - 161/95, 138/82, 139/67, 162/88, 144/77 - HR 90's    Medications and allergies reviewed with patient and updated if appropriate.  Patient Active Problem List   Diagnosis Date Noted  . Obesity 11/02/2019  . Family hx-stroke 11/02/2019  . Acute ischemic stroke ([place]) R thalamic infarct d/t small vessel disease s/p IV tPA 10/30/2019  . Localized osteoarthrosis of right hip, mod-sev 07/22/2019  . Otitis media 04/12/2019  . Otitis externa 04/12/2019  . Digital mucinous cyst of finger 02/09/2019  . Chronic pain of right knee 06/17/2018  . Wears hearing aid 07/04/2016  . 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 Hyperlipidemia 06/29/2016  . 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 Vitamin D deficiency 06/29/2016  . BPH (benign prostatic hyperplasia) 06/29/2016    Current Outpatient Medications on File Prior to Visit  Medication Sig Dispense Refill  . acetaminophen (TYLENOL) 650 MG CR tablet Take 650 mg by mouth 2 (two) times daily with a meal.    . aspirin EC 81 MG EC tablet Take 1 tablet (81 mg total) by mouth  daily.    . clopidogrel (PLAVIX) 75 MG tablet Take 1 tablet (75 mg total) by mouth daily for 21 days. 21 tablet 0  . diclofenac Sodium (VOLTAREN) 1 % GEL Apply 1 application topically 2 (two) times daily as needed (hip pain).    Marland Kitchen diphenhydramine-acetaminophen (TYLENOL PM) 25-500 MG TABS tablet Take 1 tablet by mouth at bedtime.    . Menthol, Topical Analgesic, (BLUE-EMU MAXIMUM STRENGTH) 2.5 % LIQD Apply 1 application topically 2 (two) times daily as needed (hip pain).    . pravastatin (PRAVACHOL) 40 MG tablet Take 1 tablet (40 mg total) by mouth daily at 6 PM. 30 tablet 2  . vitamin E 1000 UNIT capsule Take 1,000 Units by mouth daily with breakfast.     No current facility-administered medications on file prior to visit.    No past medical  history on file.  Past Surgical History:  Procedure Laterality Date  . CHOLECYSTECTOMY    . INGUINAL HERNIA REPAIR Right 01/24/2017   Procedure: LAPAROSCOPIC RIGHT INGUINAL HERNIA WITH MESH;  Surgeon: Ralene Ok, MD;  Location: [facility];  Service: General;  Laterality: Right;  . INSERTION OF MESH Right 01/24/2017   Procedure: INSERTION OF MESH;  Surgeon: Ralene Ok, MD;  Location: [facility];  Service: General;  Laterality: Right;    Social History   Socioeconomic History  . Marital status: Married    Spouse name: Not on file  . Number of children: Not on file  . Years of education: Not on file  . Highest education level: Not on file  Occupational History  . Not on file  Tobacco Use  . Smoking status: Former Research scientist (life sciences)  . Smokeless tobacco: Never Used  Substance and Sexual Activity  . Alcohol use: No  . Drug use: No  . Sexual activity: Not on file  Other Topics Concern  . Not on file  Social History Narrative  . Not on file   Social Determinants of Health   Financial Resource Strain:   . Difficulty of Paying Living Expenses:   Food Insecurity:   . Worried About Charity fundraiser in the Last Year:   . Arboriculturist in the Last Year:   Transportation Needs: No Transportation Needs  . Lack of Transportation (Medical): No  . Lack of Transportation (Non-Medical): No  Physical Activity:   . Days of Exercise per Week:   . Minutes of Exercise per Session:   Stress:   . Feeling of Stress :   Social Connections:   . Frequency of Communication with Friends and Family:   . Frequency of Social Gatherings with Friends and Family:   . Attends Religious Services:   . Active Member of Clubs or Organizations:   . Attends Archivist Meetings:   Marland Kitchen Marital Status:     Family History  Problem Relation Age of Onset  . Arthritis Mother   . Heart disease Father   . Arthritis Father   . Diabetes Maternal Aunt   . Cancer Paternal Uncle        lung  . Stroke Maternal  Grandmother   . Diabetes Maternal Grandmother     Review of Systems  Constitutional: Negative for fever.  Eyes: Negative for visual disturbance.  Respiratory: Positive for shortness of breath (non-exertional). Negative for cough and wheezing.   Cardiovascular: Negative for chest pain, palpitations and leg swelling.  Neurological: Positive for dizziness (mild). Negative for weakness, light-headedness, numbness and headaches.  Psychiatric/Behavioral: Positive for sleep  disturbance. Negative for dysphoric mood. The patient is nervous/anxious.        Objective:   Vitals:   11/16/19 1005  BP: (!) 142/80  Pulse: 93  Resp: 18  Temp: 98.4 F (36.9 C)  SpO2: 97%   BP Readings from Last 3 Encounters:  11/16/19 (!) 142/80  11/02/19 (!) 149/91  07/19/19 (!) 144/92   Wt Readings from Last 3 Encounters:  11/16/19 214 lb (97.1 kg)  10/30/19 218 lb 0.6 oz (98.9 kg)  07/19/19 202 lb (91.6 kg)   Body mass index is 29.85 kg/m.   Physical Exam    Constitutional: Appears well-developed and well-nourished. No distress.  HENT:  Head: Normocephalic and atraumatic.  Neck: Neck supple. No tracheal deviation present. No thyromegaly present.  No cervical lymphadenopathy Cardiovascular: Normal rate, regular rhythm and normal heart sounds.   No murmur heard. No carotid bruit .  No edema Pulmonary/Chest: Effort normal and breath sounds normal. No respiratory distress. No has no wheezes. No rales. Neurological: Cranial nerves II-XII intact, normal strength and sensation all extremities Skin: Skin is warm and dry. Not diaphoretic.  Psychiatric: Normal mood and affect. Behavior is normal.      Assessment & Plan:    See Problem List for Assessment and Plan of chronic medical problems.    This visit occurred during the SARS-CoV-2 public health emergency.  Safety protocols were in place, including screening questions prior to the visit, additional usage of staff PPE, and extensive cleaning of  exam room while observing appropriate contact time as indicated for disinfecting solutions.

## 2019-11-15 NOTE — Assessment & Plan Note (Addendum)
Acute  - 10/30/19 S/p TPA Started on ASA 81 mg, plavix 75 mg ( x 3 weeks), pravastatin Home OT, SLP-has speech therapy now No residuals Continue above medication.  Will add blood pressure medication for persistently elevated BP Has neurology follow-up Encouraged regular exercise

## 2019-11-16 ENCOUNTER — Ambulatory Visit (INDEPENDENT_AMBULATORY_CARE_PROVIDER_SITE_OTHER): Payer: Medicare Other | Admitting: Internal Medicine

## 2019-11-16 ENCOUNTER — Encounter: Payer: Self-pay | Admitting: Internal Medicine

## 2019-11-16 ENCOUNTER — Other Ambulatory Visit: Payer: Self-pay

## 2019-11-16 VITALS — BP 142/80 | HR 93 | Temp 98.4°F | Resp 18 | Ht 71.0 in | Wt 214.0 lb

## 2019-11-16 DIAGNOSIS — E669 Obesity, unspecified: Secondary | ICD-10-CM

## 2019-11-16 DIAGNOSIS — I69315 Cognitive social or emotional deficit following cerebral infarction: Secondary | ICD-10-CM | POA: Diagnosis not present

## 2019-11-16 DIAGNOSIS — Z87891 Personal history of nicotine dependence: Secondary | ICD-10-CM

## 2019-11-16 DIAGNOSIS — I700 Atherosclerosis of aorta: Secondary | ICD-10-CM

## 2019-11-16 DIAGNOSIS — E782 Mixed hyperlipidemia: Secondary | ICD-10-CM

## 2019-11-16 DIAGNOSIS — I10 Essential (primary) hypertension: Secondary | ICD-10-CM | POA: Insufficient documentation

## 2019-11-16 DIAGNOSIS — I69354 Hemiplegia and hemiparesis following cerebral infarction affecting left non-dominant side: Secondary | ICD-10-CM | POA: Diagnosis not present

## 2019-11-16 DIAGNOSIS — E6609 Other obesity due to excess calories: Secondary | ICD-10-CM | POA: Diagnosis not present

## 2019-11-16 DIAGNOSIS — R1310 Dysphagia, unspecified: Secondary | ICD-10-CM

## 2019-11-16 DIAGNOSIS — Z9181 History of falling: Secondary | ICD-10-CM

## 2019-11-16 DIAGNOSIS — H9190 Unspecified hearing loss, unspecified ear: Secondary | ICD-10-CM

## 2019-11-16 DIAGNOSIS — Z6828 Body mass index (BMI) 28.0-28.9, adult: Secondary | ICD-10-CM

## 2019-11-16 DIAGNOSIS — Z7982 Long term (current) use of aspirin: Secondary | ICD-10-CM

## 2019-11-16 DIAGNOSIS — I119 Hypertensive heart disease without heart failure: Secondary | ICD-10-CM

## 2019-11-16 DIAGNOSIS — M1990 Unspecified osteoarthritis, unspecified site: Secondary | ICD-10-CM

## 2019-11-16 DIAGNOSIS — I639 Cerebral infarction, unspecified: Secondary | ICD-10-CM | POA: Diagnosis not present

## 2019-11-16 DIAGNOSIS — Z6830 Body mass index (BMI) 30.0-30.9, adult: Secondary | ICD-10-CM

## 2019-11-16 DIAGNOSIS — I69322 Dysarthria following cerebral infarction: Secondary | ICD-10-CM | POA: Diagnosis not present

## 2019-11-16 DIAGNOSIS — I69391 Dysphagia following cerebral infarction: Secondary | ICD-10-CM | POA: Diagnosis not present

## 2019-11-16 DIAGNOSIS — Z7902 Long term (current) use of antithrombotics/antiplatelets: Secondary | ICD-10-CM

## 2019-11-16 MED ORDER — LOSARTAN POTASSIUM 50 MG PO TABS: 50.0000 mg | ORAL_TABLET | Freq: Every day | ORAL | 1 refills | Status: DC

## 2019-11-16 MED ORDER — TRAZODONE HCL 50 MG PO TABS: 50.0000 mg | ORAL_TABLET | Freq: Every evening | ORAL | 3 refills | Status: DC | PRN

## 2019-11-16 NOTE — Assessment & Plan Note (Signed)
Chronic BMI 29.85 Encouraged increased activity/regular exercise Encouraged healthy diet, decrease portions Encouraged weight loss

## 2019-11-16 NOTE — Assessment & Plan Note (Signed)
New Blood pressure persistently elevated at home and it has been high here as well He has been resistant to starting blood pressure medications, but now agrees to start medication Start losartan 50 mg daily Continue to monitor BP at home Follow-up in 1 month-we will adjust medication if needed

## 2019-11-16 NOTE — Patient Instructions (Addendum)
    Medications reviewed and updated.  Changes include :   Trazodone 50 mg for sleep.  Start losartan 50 mg for BP.    Your prescription(s) have been submitted to your pharmacy. Please take as directed and contact our office if you believe you are having problem(s) with the medication(s).   Please followup in 1 month

## 2019-11-16 NOTE — Assessment & Plan Note (Signed)
Chronic Has not tolerated statins in the past Currently taking pravastatin 40 mg daily and is tolerating it without side effects Follow-up in 1 month-we will recheck lipids at that time

## 2019-11-17 ENCOUNTER — Encounter (INDEPENDENT_AMBULATORY_CARE_PROVIDER_SITE_OTHER): Payer: Self-pay | Admitting: Otolaryngology

## 2019-11-17 ENCOUNTER — Ambulatory Visit (INDEPENDENT_AMBULATORY_CARE_PROVIDER_SITE_OTHER): Payer: Medicare Other | Admitting: Otolaryngology

## 2019-11-17 VITALS — Temp 97.5°F

## 2019-11-17 DIAGNOSIS — H60311 Diffuse otitis externa, right ear: Secondary | ICD-10-CM

## 2019-11-17 NOTE — Progress Notes (Signed)
HPI: Joe Robertson is a 79 y.o. male who presents for evaluation of blockage of his right ear.  He was previously seen 2 months ago.  The right ear is blocked again.  He wears bilateral hearing aids.  He used some alcohol and vinegar ear rinses that seemed to help a little bit.Marland Kitchen  No past medical history on file. Past Surgical History:  Procedure Laterality Date  . CHOLECYSTECTOMY    . INGUINAL HERNIA REPAIR Right 01/24/2017   Procedure: LAPAROSCOPIC RIGHT INGUINAL HERNIA WITH MESH;  Surgeon: Ralene Ok, MD;  Location: [facility];  Service: General;  Laterality: Right;  . INSERTION OF MESH Right 01/24/2017   Procedure: INSERTION OF MESH;  Surgeon: Ralene Ok, MD;  Location: [place];  Service: General;  Laterality: Right;   Social History   Socioeconomic History  . Marital status: Married    Spouse name: Not on file  . Number of children: Not on file  . Years of education: Not on file  . Highest education level: Not on file  Occupational History  . Not on file  Tobacco Use  . Smoking status: Former Research scientist (life sciences)  . Smokeless tobacco: Never Used  Substance and Sexual Activity  . Alcohol use: No  . Drug use: No  . Sexual activity: Not on file  Other Topics Concern  . Not on file  Social History Narrative  . Not on file   Social Determinants of Health   Financial Resource Strain:   . Difficulty of Paying Living Expenses:   Food Insecurity:   . Worried About Charity fundraiser in the Last Year:   . Arboriculturist in the Last Year:   Transportation Needs: No Transportation Needs  . Lack of Transportation (Medical): No  . Lack of Transportation (Non-Medical): No  Physical Activity:   . Days of Exercise per Week:   . Minutes of Exercise per Session:   Stress:   . Feeling of Stress :   Social Connections:   . Frequency of Communication with Friends and Family:   . Frequency of Social Gatherings with Friends and Family:   . Attends Religious Services:   . Active Member of  Clubs or Organizations:   . Attends Archivist Meetings:   Marland Kitchen Marital Status:    Family History  Problem Relation Age of Onset  . Arthritis Mother   . Heart disease Father   . Arthritis Father   . Diabetes Maternal Aunt   . Cancer Paternal Uncle        lung  . Stroke Maternal Grandmother   . Diabetes Maternal Grandmother    Allergies  Allergen Reactions  . Simvastatin Other (See Comments)    Muscle pain  . Lipitor [Atorvastatin] Other (See Comments)    Muscle aches   Prior to Admission medications   Medication Sig Start Date End Date Taking? Authorizing Provider  acetaminophen (TYLENOL) 650 MG CR tablet Take 650 mg by mouth 2 (two) times daily with a meal.    [provider]  aspirin EC 81 MG EC tablet Take 1 tablet (81 mg total) by mouth daily. 11/03/19   Donzetta Starch, NP  clopidogrel (PLAVIX) 75 MG tablet Take 1 tablet (75 mg total) by mouth daily for 21 days. 11/02/19 11/23/19  Donzetta Starch, NP  diclofenac Sodium (VOLTAREN) 1 % GEL Apply 1 application topically 2 (two) times daily as needed (hip pain).    [provider]  losartan (COZAAR) 50 MG tablet  Take 1 tablet (50 mg total) by mouth daily. 11/16/19   Binnie Rail, MD  Menthol, Topical Analgesic, (BLUE-EMU MAXIMUM STRENGTH) 2.5 % LIQD Apply 1 application topically 2 (two) times daily as needed (hip pain).    [provider]  pravastatin (PRAVACHOL) 40 MG tablet Take 1 tablet (40 mg total) by mouth daily at 6 PM. 11/02/19   Donzetta Starch, NP  traZODone (DESYREL) 50 MG tablet Take 1 tablet (50 mg total) by mouth at bedtime as needed for sleep. 11/16/19   Binnie Rail, MD  vitamin E 1000 UNIT capsule Take 1,000 Units by mouth daily with breakfast.    [provider]     Positive ROS: Otherwise negative  All other systems have been reviewed and were otherwise negative with the exception of those mentioned in the HPI and as above.  Physical Exam: Constitutional: Alert,  well-appearing, no acute distress Ears: External ears without lesions or tenderness. Ear canals left ear canal and left TM are clear.  He has a mild right external otitis within the medial portion of the ear canal that was cleaned with suction.  This seems to be possibly fungal in rises superiorly.  After cleaning the ear canal copiously with hydroperoxide and suction I applied gentian violet Floxin and CSF powder to the right ear.. Nasal: External nose without lesions. Clear nasal passages Oral: Oropharynx clear. Neck: No palpable adenopathy or masses Respiratory: Breathing comfortably  Skin: No facial/neck lesions or rash noted.  Cerumen impaction removal  Date/Time: 11/17/2019 1:40 PM Performed by: Rozetta Nunnery, MD Authorized by: Rozetta Nunnery, MD   Consent:    Consent obtained:  Verbal   Consent given by:  Patient   Risks discussed:  Pain and bleeding Procedure details:    Location:  R ear   Procedure type: irrigation and suction   Post-procedure details:    Inspection:  TM intact and canal normal   Hearing quality:  Improved   Patient tolerance of procedure:  Tolerated well, no immediate complications Comments:     After cleaning the right ear canal I applied gentian violet, Floxin and CSF powder.    Assessment: Right external otitis  Plan: Recommended leaving the hearing aid out for 4 days and use of alcohol vinegar ear rinses if he has any problems in the future.  Radene Journey, MD

## 2019-11-22 ENCOUNTER — Telehealth: Payer: Self-pay

## 2019-11-22 NOTE — Telephone Encounter (Signed)
New message   Pt c/o medication issue:  1. Name of Medication: traZODone (DESYREL) 50 MG tablet  2. How are you currently taking this medication (dosage and times per day)? At night   3. Are you having a reaction (difficulty breathing--STAT)? No   4. What is your medication issue?  Take his cholesterol medication at  6 pm , Up / down all night with acid reflect. Medication is not working

## 2019-11-22 NOTE — Telephone Encounter (Signed)
Have him try changing the pravastatin to the morning to see if that helps.  It is not ideal but if it helps him tolerate it then it is worth it.   Have him try two trazodone at night 100 mg.

## 2019-11-22 NOTE — Telephone Encounter (Signed)
Patient's wife calling to check status of this message and to "make sure Dr Quay Burow' gets the message." Please advise.

## 2019-11-23 NOTE — Telephone Encounter (Signed)
Wife aware of response and expressed understanding.

## 2019-11-26 ENCOUNTER — Telehealth: Payer: Self-pay | Admitting: Internal Medicine

## 2019-11-26 NOTE — Telephone Encounter (Signed)
    1. What are your last 5 BP readings? 123/65, 121/66, 115/61  2. Are you having any other symptoms (ex. Dizziness, headache, blurred vision, passed out)? no  3. What is your BP issue? Spouse calling to report BP. Wants to know if these readings are too low

## 2019-11-26 NOTE — Telephone Encounter (Signed)
Notified pt/wife w/MD response../lmb 

## 2019-11-26 NOTE — Telephone Encounter (Signed)
These readings are not too low, unless he feels any lightheadedness/dizziness.    Continue current medications and continue to monitor

## 2019-12-02 ENCOUNTER — Encounter: Payer: Self-pay | Admitting: Adult Health

## 2019-12-02 ENCOUNTER — Ambulatory Visit: Payer: Medicare Other | Admitting: Adult Health

## 2019-12-02 ENCOUNTER — Other Ambulatory Visit: Payer: Self-pay

## 2019-12-02 VITALS — BP 127/73 | HR 87 | Temp 97.4°F | Ht 70.5 in | Wt 218.2 lb

## 2019-12-02 DIAGNOSIS — I639 Cerebral infarction, unspecified: Secondary | ICD-10-CM

## 2019-12-02 DIAGNOSIS — I10 Essential (primary) hypertension: Secondary | ICD-10-CM

## 2019-12-02 DIAGNOSIS — E785 Hyperlipidemia, unspecified: Secondary | ICD-10-CM

## 2019-12-02 NOTE — Progress Notes (Signed)
I agree with the above plan 

## 2019-12-02 NOTE — Progress Notes (Signed)
[facility] [address] [place] [address] [phone]       HOSPITAL FOLLOW UP NOTE  Joe Robertson Date of Birth:  08-10-1941 Medical Record Number:  [id number]   Reason for Referral:  hospital stroke follow up    SUBJECTIVE:   CHIEF COMPLAINT:  Chief Complaint  Patient presents with  . Cerebrovascular Accident    rm 9 wife- Romie Minus, hospital FU "doing well"    HPI:   Joe Robertson, MDis a 79 y.o.malewith history of arthritis, aortic arthrosclerosis, and HLD presented on 10/30/2027 with left sided numbness.  Evaluated by stroke team and Dr. 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  Recommended DAPT for 3 weeks and aspirin alone.  LDL 151 and history of statin intolerance with Zocor and Lipitor and patient agreeable to trial of pravastatin 40 mg daily.  No history of HTN or DM.  No prior history of stroke.  Evaluated by therapies and recommended home health OT and SLP and was discharged home in stable condition on 11/02/2019.  Stroke:R thalamic infarct s/p IV tPA, secondary to small vessel disease  Code Stroke CT Head - No acute intracranial abnormality.   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   MRI head -R thalamic infarct  2D Echo -EF60-65%. No source of embolus  [employer] Virus 2 - negative  LDL - 151 -initiate pravastatin 40 mg daily -history of statin intolerance to simvastatin and atorvastatin  HgbA1c- 5.4  No antithromboticprior to admission, now onaspirin 81 mg and plavix 75 mg daily x 3 weeks then aspirin alone.  Therapy recommendations:No PT, OP OT, OP SLP -> HH OT and SLP arranged  Disposition: d/c home   Today, 12/02/2019, Joe Robertson is being seen for hospital follow-up. He has been doing well from a stroke  standpoint with residual mild cognitive impairment but denies weakness or numbness/tingling. 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 He has completed 3 weeks DAPT and continues on aspirin alone without bleeding or bruising.  Continues on pravastatin 40 mg daily tolerating well without side effects.  Blood pressure today  127/73 with similar home readings. PCP recently initiated losartan 50 mg daily for elevated BP and new diagnosis of HTN.  Also reported difficulty sleeping with increased anxiety and PCP initiated trazodone 50 mg nightly and recently increased to 100 mg nightly.  No further concerns at this time.    ROS:   14 system review of systems performed and negative with exception of short-term memory loss and joint pain  PMH:  Past Medical History:  Diagnosis Date  . Hyperlipidemia   . Stroke [facility] 10/2019    PSH:  Past Surgical History:  Procedure Laterality Date  . CHOLECYSTECTOMY    . INGUINAL HERNIA REPAIR Right 01/24/2017   Procedure: LAPAROSCOPIC RIGHT INGUINAL HERNIA WITH MESH;  Surgeon: Ralene Ok, MD;  Location: [place];  Service: General;  Laterality: Right;  . INSERTION OF MESH Right 01/24/2017   Procedure: INSERTION OF MESH;  Surgeon: Ralene Ok, MD;  Location: [facility] OR;  Service: General;  Laterality: Right;    Social History:  Social History   Socioeconomic History  . Marital status: Married    Spouse name: Romie Minus  . Number of children: Not on  file  . Years of education: Not on file  . Highest education level: Professional school degree (e.g., MD, DDS, DVM, JD)  Occupational History    Comment: retired hospital chaplain  Tobacco Use  . Smoking status: Former Research scientist (life sciences)  . Smokeless tobacco: Never Used  . Tobacco comment: as teenager  Substance and Sexual Activity  . Alcohol use: No  . Drug use: No  . Sexual activity: Not on file  Other Topics Concern  . Not on file    Social History Narrative  . Not on file   Social Determinants of Health   Financial Resource Strain:   . Difficulty of Paying Living Expenses:   Food Insecurity:   . Worried About Charity fundraiser in the Last Year:   . Arboriculturist in the Last Year:   Transportation Needs: No Transportation Needs  . Lack of Transportation (Medical): No  . Lack of Transportation (Non-Medical): No  Physical Activity:   . Days of Exercise per Week:   . Minutes of Exercise per Session:   Stress:   . Feeling of Stress :   Social Connections:   . Frequency of Communication with Friends and Family:   . Frequency of Social Gatherings with Friends and Family:   . Attends Religious Services:   . Active Member of Clubs or Organizations:   . Attends Archivist Meetings:   Marland Kitchen Marital Status:   Intimate Partner Violence:   . Fear of Current or Ex-Partner:   . Emotionally Abused:   Marland Kitchen Physically Abused:   . Sexually Abused:     Family History:  Family History  Problem Relation Age of Onset  . Arthritis Mother   . Heart disease Father   . Arthritis Father   . Diabetes Maternal Aunt   . Cancer Paternal Uncle        lung  . Stroke Maternal Grandmother   . Diabetes Maternal Grandmother     Medications:   Current Outpatient Medications on File Prior to Visit  Medication Sig Dispense Refill  . acetaminophen (TYLENOL) 650 MG CR tablet Take 650 mg by mouth 2 (two) times daily with a meal.    . aspirin EC 81 MG EC tablet Take 1 tablet (81 mg total) by mouth daily.    . diclofenac Sodium (VOLTAREN) 1 % GEL Apply 1 application topically 2 (two) times daily as needed (hip pain).    Marland Kitchen losartan (COZAAR) 50 MG tablet Take 1 tablet (50 mg total) by mouth daily. 90 tablet 1  . Menthol, Topical Analgesic, (BLUE-EMU MAXIMUM STRENGTH) 2.5 % LIQD Apply 1 application topically 2 (two) times daily as needed (hip pain).    . pravastatin (PRAVACHOL) 40 MG tablet Take 1 tablet (40 mg total) by mouth daily  at 6 PM. 30 tablet 2  . traZODone (DESYREL) 50 MG tablet Take 1 tablet (50 mg total) by mouth at bedtime as needed for sleep. 30 tablet 3  . vitamin E 1000 UNIT capsule Take 1,000 Units by mouth daily with breakfast.     No current facility-administered medications on file prior to visit.    Allergies:   Allergies  Allergen Reactions  . Simvastatin Other (See Comments)    Muscle pain  . Lipitor [Atorvastatin] Other (See Comments)    Muscle aches      OBJECTIVE:  Vitals  Vitals:   12/02/19 0848  BP: 127/73  Pulse: 87  Temp: (!) 97.4 F (36.3 C)  Weight: 218 lb  3.2 oz (99 kg)  Height: 5' 10.5" (1.791 m)   Body mass index is 30.87 kg/m. No exam data present  PHQ 2/9  Depression screen PHQ 2/9 12/02/2019  Decreased Interest 0  Down, Depressed, Hopeless 0  PHQ - 2 Score 0    Physical exam  General: well developed, well nourished, very pleasant elderly Caucasian male, seated, in no evident distress Head: head normocephalic and atraumatic.   Neck: supple with no carotid or supraclavicular bruits Cardiovascular: regular rate and rhythm, no murmurs Musculoskeletal: no deformity Skin:  no rash/petichiae Vascular:  Normal pulses all extremities   Neurologic Exam Mental Status: Awake and fully alert.  Normal speech and language. Oriented to place and time. Recent memory subjectively impaired and remote memory intact. Attention span, concentration and fund of knowledge appropriate during visit. Mood and affect appropriate.  Cranial Nerves: Fundoscopic exam reveals sharp disc margins. Pupils equal, briskly reactive to light. Extraocular movements full without nystagmus. Visual fields full to confrontation. Hearing intact. Facial sensation intact. Face, tongue, palate moves normally and symmetrically.  Motor: Normal bulk and tone. Normal strength in all tested extremity muscles. Sensory.: intact to touch , pinprick , position and vibratory sensation.  Coordination: Rapid  alternating movements normal in all extremities. Finger-to-nose and heel-to-shin performed accurately bilaterally. Gait and Station: Arises from chair without difficulty. Stance is normal. Gait demonstrates normal stride length and balance with use of cane due to hip pain Reflexes: 1+ and symmetric. Toes downgoing.     NIHSS  0 Modified Rankin  1     ASSESSMENT: Joe Robertson is a 79 y.o. year old male presented with left-sided numbness on 10/30/2019 with stroke work-up revealing right chronic infarct s/p tPA secondary to small vessel disease. 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 Doing well from a stroke standpoint with residual mild cognitive impairment with ongoing improvement     PLAN:  1. Right thalamic stroke:  -Mild cognitive impairment: Continue to work with home health therapies for ongoing improvement along with recommending memory exercises such as crossword puzzles, word search, card games and reading along with importance of managing stroke risk factors -Continue aspirin 81 mg daily  and pravastatin for secondary stroke prevention.  -Maintain strict control of hypertension with blood pressure goal below 130/90, diabetes with hemoglobin A1c goal below 6.5% and cholesterol with LDL cholesterol (bad cholesterol) goal below 70 mg/dL.  I also advised the patient to eat a healthy diet with plenty of whole grains, cereals, fruits and vegetables, exercise regularly with at least 30 minutes of continuous activity daily and maintain ideal body weight. 2. HLD: Tolerating pravastatin 40 mg daily without side effects. History of intolerance to simvastatin and atorvastatin. Plans on repeating lipid panel in the near future with PCP. Request ongoing prescribing, monitoring and management by PCP. 3. HTN: Excellent improvement of blood pressure since initiating losartan. Continue to monitor at  home and ongoing follow-up with PCP     Follow up in 4 months or call earlier if needed   I spent 50 minutes of face-to-face and non-face-to-face time with patient and wife.  This included previsit chart review, lab review, study review, order entry, electronic health record documentation, patient education regarding recent stroke, residual deficits, importance of managing stroke risk factors and answered all questions to patient satisfaction     Frann Rider, [facility]  [facility] [address] [place], [place] [address]  Phone [phone]  Fax [phone] Note: This document was prepared with digital dictation and possible smart phrase technology. Any transcriptional errors that result from this process are unintentional.

## 2019-12-02 NOTE — Patient Instructions (Addendum)
Continue aspirin 81 mg daily  and pravastatin 40 mg daily for secondary stroke prevention  Continue to follow up with PCP regarding cholesterol and blood pressure management   Continue to monitor blood pressure at home  Maintain strict control of hypertension with blood pressure goal below 130/90, diabetes with hemoglobin A1c goal below 6.5% and cholesterol with LDL cholesterol (bad cholesterol) goal below 70 mg/dL. I also advised the patient to eat a healthy diet with plenty of whole grains, cereals, fruits and vegetables, exercise regularly and maintain ideal body weight.  Followup in the future with me in 4 months or call earlier if needed       Thank you for coming to see [place] at [facility]. I hope we have been able to provide you high quality care today.  You may receive a patient satisfaction survey over the next few weeks. We would appreciate your feedback and comments so that we may continue to improve ourselves and the health of our patients.     Stroke Prevention Some medical conditions and lifestyle choices can lead to a higher risk for a stroke. You can help to prevent a stroke by making nutrition, lifestyle, and other changes. What nutrition changes can be made?   Eat healthy foods. ? Choose foods that are high in fiber. These include:  Fresh fruits.  Fresh vegetables.  Whole grains. ? Eat at least 5 or more servings of fruits and vegetables each day. Try to fill half of your plate at each meal with fruits and vegetables. ? Choose lean protein foods. These include:  Lowfat (lean) cuts of meat.  Chicken without skin.  Fish.  Tofu.  Beans.  Nuts. ? Eat low-fat dairy products. ? Avoid foods that:  Are high in salt (sodium).  Have saturated fat.  Have trans fat.  Have cholesterol.  Are processed.  Are premade.  Follow eating guidelines as told by your doctor. These may include: ? Reducing how many calories you eat and drink  each day. ? Limiting how much salt you eat or drink each day to 1,500 milligrams (mg). ? Using only healthy fats for cooking. These include:  Olive oil.  Canola oil.  Sunflower oil. ? Counting how many carbohydrates you eat and drink each day. What lifestyle changes can be made?  Try to stay at a healthy weight. Talk to your doctor about what a good weight is for you.  Get at least 30 minutes of moderate physical activity at least 5 days a week. This can include: ? Fast walking. ? Biking. ? Swimming.  Do not use any products that have nicotine or tobacco. This includes cigarettes and e-cigarettes. If you need help quitting, ask your doctor. Avoid being around tobacco smoke in general.  Limit how much alcohol you drink to no more than 1 drink a day for nonpregnant women and 2 drinks a day for men. One drink equals 12 oz of beer, 5 oz of wine, or 1 oz of hard liquor.  Do not use drugs.  Avoid taking birth control pills. Talk to your doctor about the risks of taking birth control pills if: ? You are over 31 years old. ? You smoke. ? You get migraines. ? You have had a blood clot. What other changes can be made?  Manage your cholesterol. ? It is important to eat a healthy diet. ? If your cholesterol cannot be managed through your diet, you may also need to take medicines. Take medicines as told  by your doctor.  Manage your diabetes. ? It is important to eat a healthy diet and to exercise regularly. ? If your blood sugar cannot be managed through diet and exercise, you may need to take medicines. Take medicines as told by your doctor.  Control your high blood pressure (hypertension). ? Try to keep your blood pressure below 130/80. This can help lower your risk of stroke. ? It is important to eat a healthy diet and to exercise regularly. ? If your blood pressure cannot be managed through diet and exercise, you may need to take medicines. Take medicines as told by your  doctor. ? Ask your doctor if you should check your blood pressure at home. ? Have your blood pressure checked every year. Do this even if your blood pressure is normal.  Talk to your doctor about getting checked for a sleep disorder. Signs of this can include: ? Snoring a lot. ? Feeling very tired.  Take over-the-counter and prescription medicines only as told by your doctor. These may include aspirin or blood thinners (antiplatelets or anticoagulants).  Make sure that any other medical conditions you have are managed. Where to find more information  American Stroke Association: www.strokeassociation.org  National Stroke Association: www.stroke.org Get help right away if:  You have any symptoms of stroke. "BE FAST" is an easy way to remember the main warning signs: ? B - Balance. Signs are dizziness, sudden trouble walking, or loss of balance. ? E - Eyes. Signs are trouble seeing or a sudden change in how you see. ? F - Face. Signs are sudden weakness or loss of feeling of the face, or the face or eyelid drooping on one side. ? A - Arms. Signs are weakness or loss of feeling in an arm. This happens suddenly and usually on one side of the body. ? S - Speech. Signs are sudden trouble speaking, slurred speech, or trouble understanding what people say. ? T - Time. Time to call emergency services. Write down what time symptoms started.  You have other signs of stroke, such as: ? A sudden, very bad headache with no known cause. ? Feeling sick to your stomach (nausea). ? Throwing up (vomiting). ? Jerky movements you cannot control (seizure). These symptoms may represent a serious problem that is an emergency. Do not wait to see if the symptoms will go away. Get medical help right away. Call your local emergency services (911 in the [place] Do not drive yourself to the hospital. Summary  You can prevent a stroke by eating healthy, exercising, not smoking, drinking less alcohol, and treating  other health problems, such as diabetes, high blood pressure, or high cholesterol.  Do not use any products that contain nicotine or tobacco, such as cigarettes and e-cigarettes.  Get help right away if you have any signs or symptoms of a stroke. This information is not intended to replace advice given to you by your health care provider. Make sure you discuss any questions you have with your health care provider. Document Revised: 10/01/2018 Document Reviewed: 11/06/2016 Elsevier Patient Education  [place].

## 2019-12-03 ENCOUNTER — Telehealth: Payer: Self-pay

## 2019-12-03 MED ORDER — TRAZODONE HCL 100 MG PO TABS: 100.0000 mg | ORAL_TABLET | Freq: Every day | ORAL | 0 refills | Status: DC

## 2019-12-03 NOTE — Telephone Encounter (Signed)
Updated rx sent to pharmacy

## 2019-12-03 NOTE — Telephone Encounter (Signed)
New message    Pt c/o medication issue:  1. Name of Medication: traZODone (DESYREL) 50 MG tablet  2. How are you currently taking this medication (dosage and times per day)? Two pill at night - was in the beginning taken one at night   3. Are you having a reaction (difficulty breathing--STAT)? No   4. What is your medication issue? Patient only has two night left need another refill  - The pharmacy Dr. Quay Burow will need to call the pharmacy.   [place], [place] AT [place]

## 2019-12-06 ENCOUNTER — Other Ambulatory Visit: Payer: Self-pay | Admitting: *Deleted

## 2019-12-06 NOTE — Patient Outreach (Signed)
[facility]) Care Management  12/06/2019  Joe Robertson 1940/10/20 [id number]   Subjective: Telephone call to patient's home / mobile number, spoke with patient, and HIPAA verified.  Discussed [facility] Care Management BCBS Medicare EMMI Red Alert Flag follow up, patient voiced understanding, and is in agreement to follow up.   Patient states he is doing very well, had hospital follow up appointment with neurologist and primary MD, both appointments went well, and has another follow up appointment with primary on 12/14/2019.  Patient states he does not have any education material, EMMI follow up, care coordination, care management, disease monitoring, transportation, community resource, or pharmacy needs at this time. States he is very appreciative of the follow up, is in agreement  to receive 1 additional follow up call to assess for further CM needs, and is in agreement to receive [place] Management EMMI follow up calls as needed.       Objective: Per KPN (Knowledge Performance Now, point of care tool) and chart review, patient hospitalized 10/30/2019 - 11/02/2019 for Acute ischemic stroke , Right  thalamic infarct due to small vessel disease status post  TPA.   Patient also has a history of hyperlipidemia.       Assessment: Received BCBS Medicare EMMI Stroke Red Flag Alert on 11/05/2019.   Red Flag Alert Trigger, Day #1, patient answered no the following question: Scheduled a follow-up appointment?   EMMI follow up completed and will follow up to assess further care management needs.     Plan: RNCM will call patient for telephone outreach attempt, within 21 business days, EMMI follow up, to assess for further CM needs, and proceed with case closure, within 10 business days if no return call, after 3rd unsuccessful outreach.     Revere Maahs H. Annia Friendly, BSN, [place] [facility] Telephonic CM Phone: [phone] Fax: [phone]

## 2019-12-08 DIAGNOSIS — I69322 Dysarthria following cerebral infarction: Secondary | ICD-10-CM | POA: Diagnosis not present

## 2019-12-08 DIAGNOSIS — Z6828 Body mass index (BMI) 28.0-28.9, adult: Secondary | ICD-10-CM | POA: Diagnosis not present

## 2019-12-08 DIAGNOSIS — Z7902 Long term (current) use of antithrombotics/antiplatelets: Secondary | ICD-10-CM | POA: Diagnosis not present

## 2019-12-08 DIAGNOSIS — E669 Obesity, unspecified: Secondary | ICD-10-CM | POA: Diagnosis not present

## 2019-12-08 DIAGNOSIS — Z87891 Personal history of nicotine dependence: Secondary | ICD-10-CM | POA: Diagnosis not present

## 2019-12-08 DIAGNOSIS — I69354 Hemiplegia and hemiparesis following cerebral infarction affecting left non-dominant side: Secondary | ICD-10-CM | POA: Diagnosis not present

## 2019-12-08 DIAGNOSIS — I119 Hypertensive heart disease without heart failure: Secondary | ICD-10-CM | POA: Diagnosis not present

## 2019-12-08 DIAGNOSIS — Z7982 Long term (current) use of aspirin: Secondary | ICD-10-CM | POA: Diagnosis not present

## 2019-12-08 DIAGNOSIS — R1310 Dysphagia, unspecified: Secondary | ICD-10-CM | POA: Diagnosis not present

## 2019-12-08 DIAGNOSIS — I69391 Dysphagia following cerebral infarction: Secondary | ICD-10-CM | POA: Diagnosis not present

## 2019-12-08 DIAGNOSIS — I700 Atherosclerosis of aorta: Secondary | ICD-10-CM | POA: Diagnosis not present

## 2019-12-08 DIAGNOSIS — I69315 Cognitive social or emotional deficit following cerebral infarction: Secondary | ICD-10-CM | POA: Diagnosis not present

## 2019-12-08 DIAGNOSIS — Z9181 History of falling: Secondary | ICD-10-CM | POA: Diagnosis not present

## 2019-12-08 DIAGNOSIS — H9190 Unspecified hearing loss, unspecified ear: Secondary | ICD-10-CM | POA: Diagnosis not present

## 2019-12-08 DIAGNOSIS — M1990 Unspecified osteoarthritis, unspecified site: Secondary | ICD-10-CM | POA: Diagnosis not present

## 2019-12-13 DIAGNOSIS — G4700 Insomnia, unspecified: Secondary | ICD-10-CM | POA: Insufficient documentation

## 2019-12-13 NOTE — Patient Instructions (Addendum)
Ideal BP is < 130/80.   Blood work was ordered.     Medications reviewed and updated.  Changes include :   None  Your prescription(s) have been submitted to your pharmacy. Please take as directed and contact our office if you believe you are having problem(s) with the medication(s).    Please followup in 6 months

## 2019-12-13 NOTE — Progress Notes (Signed)
Subjective:    Patient ID: Joe Robertson, male    DOB: 01-Jul-1941, 79 y.o.   MRN: [id number]  HPI The patient is here for follow up of their chronic medical problems, including insomnia, hypertension, hyperlipdemia.   He is taking all of his medications as prescribed.     His BP at home 122/63, 139/82, 147/77, 139/73, 131/75, 127/67.  Right hip arthritis: His right hip, upper leg and knee hurt.  The pain can be severe.  Tylenol does not control his pain.  He has seen orthopedics and orthopedics recommended possible injection to see if that helped with acute on chronic pain.  He does walk with a cane and is trying to be active.  His sleep has been good with trazodone.   Medications and allergies reviewed with patient and updated if appropriate.  Patient Active Problem List   Diagnosis Date Noted  . Insomnia 12/13/2019  . Hypertension 11/16/2019  . Obesity 11/02/2019  . Family hx-stroke 11/02/2019  . 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 Localized osteoarthrosis of right hip, mod-sev 07/22/2019  . Otitis media 04/12/2019  . Otitis externa 04/12/2019  . Digital mucinous cyst of finger 02/09/2019  . Chronic pain of right knee 06/17/2018  . Wears hearing aid 07/04/2016  . 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 Hyperlipidemia 06/29/2016  . 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 Vitamin D deficiency 06/29/2016  . BPH (benign prostatic hyperplasia) 06/29/2016    Current Outpatient Medications on File Prior to Visit  Medication Sig Dispense Refill  . acetaminophen (TYLENOL) 650 MG CR tablet Take 650 mg by mouth 2 (two) times daily with a meal.    . aspirin EC 81 MG EC tablet Take 1 tablet (81 mg total) by mouth daily.    Marland Kitchen losartan (COZAAR) 50 MG tablet Take 1 tablet (50 mg total) by mouth daily. 90 tablet 1  . pravastatin (PRAVACHOL) 40 MG tablet Take 1 tablet (40 mg total) by mouth daily at 6 PM. 30 tablet 2  .  traZODone (DESYREL) 100 MG tablet Take 1 tablet (100 mg total) by mouth at bedtime. 90 tablet 0  . vitamin E 1000 UNIT capsule Take 1,000 Units by mouth daily with breakfast.     No current facility-administered medications on file prior to visit.    Past Medical History:  Diagnosis Date  . Hyperlipidemia   . Stroke [facility]) 10/2019    Past Surgical History:  Procedure Laterality Date  . CHOLECYSTECTOMY    . INGUINAL HERNIA REPAIR Right 01/24/2017   Procedure: LAPAROSCOPIC RIGHT INGUINAL HERNIA WITH MESH;  Surgeon: Ralene Ok, MD;  Location: [place];  Service: General;  Laterality: Right;  . INSERTION OF MESH Right 01/24/2017   Procedure: INSERTION OF MESH;  Surgeon: Ralene Ok, MD;  Location: [place];  Service: General;  Laterality: Right;    Social History   Socioeconomic History  . Marital status: Married    Spouse name: Romie Minus  . Number of children: Not on file  . Years of education: Not on file  . Highest education level: Professional school degree (e.g., MD, DDS, DVM, JD)  Occupational History    Comment: retired hospital chaplain  Tobacco Use  . Smoking status: Former Research scientist (life sciences)  . Smokeless tobacco: Never Used  . Tobacco comment: as teenager  Substance and Sexual Activity  . Alcohol use: No  . Drug use: No  . Sexual activity: Not on file  Other Topics  Concern  . Not on file  Social History Narrative  . Not on file   Social Determinants of Health   Financial Resource Strain:   . Difficulty of Paying Living Expenses:   Food Insecurity:   . Worried About Charity fundraiser in the Last Year:   . Arboriculturist in the Last Year:   Transportation Needs: No Transportation Needs  . Lack of Transportation (Medical): No  . Lack of Transportation (Non-Medical): No  Physical Activity:   . Days of Exercise per Week:   . Minutes of Exercise per Session:   Stress:   . Feeling of Stress :   Social Connections:   . Frequency of Communication with Friends and Family:     . Frequency of Social Gatherings with Friends and Family:   . Attends Religious Services:   . Active Member of Clubs or Organizations:   . Attends Archivist Meetings:   Marland Kitchen Marital Status:     Family History  Problem Relation Age of Onset  . Arthritis Mother   . Heart disease Father   . Arthritis Father   . Diabetes Maternal Aunt   . Cancer Paternal Uncle        lung  . Stroke Maternal Grandmother   . Diabetes Maternal Grandmother     Review of Systems  Constitutional: Negative for chills and fever.  HENT: Positive for congestion (mild).   Respiratory: Negative for cough, shortness of breath and wheezing.   Cardiovascular: Negative for chest pain, palpitations and leg swelling.  Musculoskeletal: Positive for arthralgias (right hip).  Neurological: Positive for headaches (occ, 3-4 sec pain in head). Negative for dizziness, weakness, light-headedness and numbness.       Objective:   Vitals:   12/14/19 0909  BP: 140/72  Pulse: 78  Resp: 16  Temp: 98.4 F (36.9 C)  SpO2: 96%   BP Readings from Last 3 Encounters:  12/14/19 140/72  12/02/19 127/73  11/16/19 (!) 142/80   Wt Readings from Last 3 Encounters:  12/14/19 215 lb 12.8 oz (97.9 kg)  12/02/19 218 lb 3.2 oz (99 kg)  11/16/19 214 lb (97.1 kg)   Body mass index is 30.53 kg/m.   Physical Exam    Constitutional: Appears well-developed and well-nourished. No distress.  HENT:  Head: Normocephalic and atraumatic.  Neck: Neck supple. No tracheal deviation present. No thyromegaly present.  No cervical lymphadenopathy Cardiovascular: Normal rate, regular rhythm and normal heart sounds.   No murmur heard. No carotid bruit .  No edema Pulmonary/Chest: Effort normal and breath sounds normal. No respiratory distress. No has no wheezes. No rales.  Skin: Skin is warm and dry. Not diaphoretic.  Psychiatric: Normal mood and affect. Behavior is normal.      Assessment & Plan:    See Problem List for  Assessment and Plan of chronic medical problems.    This visit occurred during the SARS-CoV-2 public health emergency.  Safety protocols were in place, including screening questions prior to the visit, additional usage of staff PPE, and extensive cleaning of exam room while observing appropriate contact time as indicated for disinfecting solutions.

## 2019-12-14 ENCOUNTER — Encounter: Payer: Self-pay | Admitting: Internal Medicine

## 2019-12-14 ENCOUNTER — Other Ambulatory Visit: Payer: Self-pay

## 2019-12-14 ENCOUNTER — Ambulatory Visit (INDEPENDENT_AMBULATORY_CARE_PROVIDER_SITE_OTHER): Payer: Medicare Other | Admitting: Internal Medicine

## 2019-12-14 VITALS — BP 140/72 | HR 78 | Temp 98.4°F | Resp 16 | Ht 70.5 in | Wt 215.8 lb

## 2019-12-14 DIAGNOSIS — E782 Mixed hyperlipidemia: Secondary | ICD-10-CM | POA: Diagnosis not present

## 2019-12-14 DIAGNOSIS — M1611 Unilateral primary osteoarthritis, right hip: Secondary | ICD-10-CM | POA: Diagnosis not present

## 2019-12-14 DIAGNOSIS — I10 Essential (primary) hypertension: Secondary | ICD-10-CM | POA: Diagnosis not present

## 2019-12-14 DIAGNOSIS — Z8673 Personal history of transient ischemic attack (TIA), and cerebral infarction without residual deficits: Secondary | ICD-10-CM

## 2019-12-14 DIAGNOSIS — G4709 Other insomnia: Secondary | ICD-10-CM

## 2019-12-14 LAB — LIPID PANEL
Cholesterol: 152 mg/dL (ref 0–200)
HDL: 33.8 mg/dL — ABNORMAL LOW (ref >39.00)
LDL Cholesterol: 96 mg/dL (ref 0–99)
NonHDL: 118.55
Total CHOL/HDL Ratio: 5
Triglycerides: 115 mg/dL (ref 0.0–149.0)
VLDL: 23 mg/dL (ref 0.0–40.0)

## 2019-12-14 LAB — COMPREHENSIVE METABOLIC PANEL
ALT: 22 U/L (ref 0–53)
AST: 18 U/L (ref 0–37)
Albumin: 4.2 g/dL (ref 3.5–5.2)
Alkaline Phosphatase: 58 U/L (ref 39–117)
BUN: 11 mg/dL (ref 6–23)
CO2: 29 mEq/L (ref 19–32)
Calcium: 9.2 mg/dL (ref 8.4–10.5)
Chloride: 104 mEq/L (ref 96–112)
Creatinine, Ser: 1.04 mg/dL (ref 0.40–1.50)
GFR: 69.15 mL/min (ref >60.00)
Glucose, Bld: 91 mg/dL (ref 70–99)
Potassium: 4 mEq/L (ref 3.5–5.1)
Sodium: 139 mEq/L (ref 135–145)
Total Bilirubin: 0.9 mg/dL (ref 0.2–1.2)
Total Protein: 7.2 g/dL (ref 6.0–8.3)

## 2019-12-14 NOTE — Assessment & Plan Note (Signed)
Chronic Check lipid panel  Continue daily statin Regular exercise and healthy diet encouraged  

## 2019-12-14 NOTE — Assessment & Plan Note (Signed)
Chronic Blood pressure better controlled with losartan 50 mg daily, but could be slightly better controlled-still slightly variable We will try to avoid more medications-he still adjusting to all of these medications that are new Encouraged continued activity, ideally some weight loss and a low-sodium diet If blood pressure still above goal at his next visit may need to add a low-dose of amlodipine or increase losartan CMP

## 2019-12-14 NOTE — Assessment & Plan Note (Addendum)
Chronic Has severe right hip and upper leg pain Saw Dr Marlou Sa - he discussed possible injection  Continue Tylenol F/u with Dr Marlou Sa prn

## 2019-12-14 NOTE — Assessment & Plan Note (Signed)
Chronic Stroke occurred last month and he denies any residual weakness, numbness/tingling or speech issues Taking aspirin 81 mg daily Taking his losartan daily Taking pravastatin 40 mg Blood pressure slightly above goal at times, but his blood pressure was on the low side when his dose was higher Advised continued increased activity and ideally weight loss If BP is still slightly elevated at his next visit we will need to add a low-dose of something like amlodipine CMP

## 2019-12-14 NOTE — Assessment & Plan Note (Addendum)
Chronic Controlled, stable Continue current dose of medication - trazodone 100 mg nightly - discussed trying to decrease dose to 50 mg if able

## 2019-12-15 ENCOUNTER — Other Ambulatory Visit: Payer: Self-pay

## 2019-12-15 DIAGNOSIS — E782 Mixed hyperlipidemia: Secondary | ICD-10-CM

## 2019-12-20 ENCOUNTER — Other Ambulatory Visit: Payer: Self-pay | Admitting: *Deleted

## 2019-12-20 NOTE — Patient Outreach (Signed)
[facility]) Care Management  12/20/2019  Joe Robertson 28-Mar-1941 [id number]   Subjective: Telephone call to patient's home / mobile number, spoke with patient, and HIPAA verified.  Discussed [facility] Care Management BCBS Medicare EMMI Stroke Red Alert  follow up, patient voiced understanding, and is in agreement to follow up.   Patient states he is doing great, feeling fine, and everything is copacetic.    States his 12/14/2019 follow appointment with primary MD went well, cholesterol medication increased (pravastatin), tolerating change without difficulty, and will have a follow up appointment with MD for blood work, in approximately 2 months.  States no other follow up appointment in the near future and home health therapies continue to go well.   Patient states he does not have any education material, EMMI follow up, care coordination, care management, disease monitoring, transportation, community resource, or pharmacy needs at this time.   Patient is aware 30 day EMMI Stroke automated program completed and will reach out to this RNCM if assistance needed in the future.  States he is very appreciative of the follow up.     Objective:Per KPN (Knowledge Performance Now, point of care tool) and chart review,patient hospitalized 10/30/2019 - 11/02/2019 forAcute ischemic stroke,Rightthalamic infarct due tosmall vessel disease status postTPA. Patient also has a history of hyperlipidemia.      Assessment: Received BCBS Medicare EMMI Stroke Red Flag Alert on 11/05/2019. Red Flag Alert Trigger, Day #1, patient answered no the following question: Scheduled a follow-up appointment?EMMI follow up completed and no further care management needs.     Plan:RNCM will complete case closure due to follow up completed / no care management needs.       Romina Divirgilio H. Annia Friendly, BSN, [facility] Telephonic CM Phone: [phone] Fax: [phone]

## 2020-01-03 ENCOUNTER — Telehealth: Payer: Self-pay | Admitting: Internal Medicine

## 2020-01-03 NOTE — Telephone Encounter (Signed)
New message:   Pt c/o medication issue:  1. Name of Medication: pravastatin (PRAVACHOL) 40 MG tablet   2. What is your medication issue? Pt's wife states that the pt just isn't tolerating this medication well. She states he is having SOB and is still trying to recover from a stroke. Please advise.

## 2020-01-03 NOTE — Telephone Encounter (Signed)
Stop the medication and make sure the SOB goes away - that is not a common side effect - if it does not go away he needs to come in for evaluation.  If it goes away we will need to discuss other options for his cholesterol.

## 2020-01-03 NOTE — Telephone Encounter (Signed)
Pts wife aware of response below and expressed understanding.

## 2020-01-10 ENCOUNTER — Telehealth: Payer: Self-pay | Admitting: Adult Health

## 2020-01-10 NOTE — Telephone Encounter (Signed)
Pt's wife called and wanted to know if the advised Cortizone shot will be given to him at this office by the provider. Please advise.

## 2020-01-10 NOTE — Telephone Encounter (Signed)
Noted. Thank you for following up on this.

## 2020-01-10 NOTE — Telephone Encounter (Addendum)
I spoke with the pt's wife. She said this was about the pt's arthritic hip and said the injection was suggested just in conversation at the office visit. The wife wanted to make sure we didn't do them. I told her we did not and I suggested she call pt's PCP or orthopedic doctor for this. She verbalized appreciation.

## 2020-01-10 NOTE — Telephone Encounter (Signed)
I am not sure what injections he is speaking of but this would not be done at our office.  Thank you for following up.

## 2020-01-13 DIAGNOSIS — M25551 Pain in right hip: Secondary | ICD-10-CM | POA: Diagnosis not present

## 2020-01-18 ENCOUNTER — Telehealth: Payer: Self-pay | Admitting: Internal Medicine

## 2020-01-18 NOTE — Telephone Encounter (Signed)
Has been off of the statin x2 weeks. Still having some SOB it has gotten better. Mainly when he lays down is when he gets it. She thinks it is anxiety from the stroke. She wants to wait until his labs come back on 6/28 to discuss at is 6/30 appointment whether he needs to go back on cholesterol medication or not.

## 2020-01-18 NOTE — Telephone Encounter (Signed)
New message:   Pt's wife is calling and states she would like a return call in regards to the pt's cholesterol medication. Please advise.

## 2020-01-19 DIAGNOSIS — M25551 Pain in right hip: Secondary | ICD-10-CM | POA: Diagnosis not present

## 2020-01-28 DIAGNOSIS — H43811 Vitreous degeneration, right eye: Secondary | ICD-10-CM | POA: Diagnosis not present

## 2020-01-28 DIAGNOSIS — H25813 Combined forms of age-related cataract, bilateral: Secondary | ICD-10-CM | POA: Diagnosis not present

## 2020-01-28 DIAGNOSIS — Z8673 Personal history of transient ischemic attack (TIA), and cerebral infarction without residual deficits: Secondary | ICD-10-CM | POA: Diagnosis not present

## 2020-02-08 ENCOUNTER — Other Ambulatory Visit: Payer: Self-pay

## 2020-02-08 NOTE — Patient Outreach (Signed)
[facility]) Care Management  02/08/2020  Dailey Buccheri 18-Jun-1941 [id number]  Telephone outreach to patient to obtain mRS was successfully completed. MRS=1  Ina Homes [facility] Management Assistant [phone]

## 2020-02-14 ENCOUNTER — Other Ambulatory Visit: Payer: Self-pay

## 2020-02-14 ENCOUNTER — Other Ambulatory Visit (INDEPENDENT_AMBULATORY_CARE_PROVIDER_SITE_OTHER): Payer: Medicare Other

## 2020-02-14 DIAGNOSIS — E782 Mixed hyperlipidemia: Secondary | ICD-10-CM | POA: Diagnosis not present

## 2020-02-14 LAB — LIPID PANEL
Cholesterol: 199 mg/dL (ref 0–200)
HDL: 36.5 mg/dL — ABNORMAL LOW (ref >39.00)
LDL Cholesterol: 143 mg/dL — ABNORMAL HIGH (ref 0–99)
NonHDL: 162.06
Total CHOL/HDL Ratio: 5
Triglycerides: 96 mg/dL (ref 0.0–149.0)
VLDL: 19.2 mg/dL (ref 0.0–40.0)

## 2020-02-14 LAB — HEPATIC FUNCTION PANEL
ALT: 17 U/L (ref 0–53)
AST: 14 U/L (ref 0–37)
Albumin: 4 g/dL (ref 3.5–5.2)
Alkaline Phosphatase: 58 U/L (ref 39–117)
Bilirubin, Direct: 0.1 mg/dL (ref 0.0–0.3)
Total Bilirubin: 0.6 mg/dL (ref 0.2–1.2)
Total Protein: 6.9 g/dL (ref 6.0–8.3)

## 2020-02-15 NOTE — Progress Notes (Signed)
Subjective:    Patient ID: Joe Robertson, male    DOB: [birth year], 79 y.o.   MRN: [id number]  HPI He is here for a physical exam.   BP at home - 136/78, 122/63, 121/64, 131/71, 144/82, 127/75.  The trazodone helps for 3-4 hrs.  He is unable to get back to sleep.  His lack of sleep is very disturbing to him.   He has episodes of SOB and gets figidity. 70-80 times a day he has jerking in b/l UE's. His SOB only occurs when he lays down at night.  He feels this is neurological and not physical.  He denies any worries/anxiety.  He denies DOE.     His balance is not good.  He has severe hip OA - there was improvement after the injection.   Medications and allergies reviewed with patient and updated if appropriate.  Patient Active Problem List   Diagnosis Date Noted  . Insomnia 12/13/2019  . Hypertension 11/16/2019  . Obesity 11/02/2019  . Family hx-stroke 11/02/2019  . History of stroke, right thalamic, s/p tPA, 10/2019 10/30/2019  . Localized osteoarthrosis of right hip, mod-sev 07/22/2019  . Otitis media 04/12/2019  . Otitis externa 04/12/2019  . Digital mucinous cyst of finger 02/09/2019  . Chronic pain of right knee 06/17/2018  . Wears hearing aid 07/04/2016  . 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 Hyperlipidemia 06/29/2016  . 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 Vitamin D deficiency 06/29/2016  . BPH (benign prostatic hyperplasia) 06/29/2016    Current Outpatient Medications on File Prior to Visit  Medication Sig Dispense Refill  . acetaminophen (TYLENOL) 650 MG CR tablet Take 650 mg by mouth 2 (two) times daily with a meal.    . aspirin EC 81 MG EC tablet Take 1 tablet (81 mg total) by mouth daily.    Marland Kitchen losartan (COZAAR) 50 MG tablet Take 1 tablet (50 mg total) by mouth daily. 90 tablet 1  . traZODone (DESYREL) 100 MG tablet Take 1 tablet (100 mg total) by mouth at bedtime. 90 tablet 0  . vitamin E 1000 UNIT capsule Take 1,000 Units by mouth daily with breakfast.      . pravastatin (PRAVACHOL) 40 MG tablet Take 1 tablet (40 mg total) by mouth daily at 6 PM. (Patient not taking: Reported on 02/16/2020) 30 tablet 2   No current facility-administered medications on file prior to visit.    Past Medical History:  Diagnosis Date  . Hyperlipidemia   . Stroke [facility]) 10/2019    Past Surgical History:  Procedure Laterality Date  . CHOLECYSTECTOMY    . INGUINAL HERNIA REPAIR Right 01/24/2017   Procedure: LAPAROSCOPIC RIGHT INGUINAL HERNIA WITH MESH;  Surgeon: Ralene Ok, MD;  Location: [place];  Service: General;  Laterality: Right;  . INSERTION OF MESH Right 01/24/2017   Procedure: INSERTION OF MESH;  Surgeon: Ralene Ok, MD;  Location: [place];  Service: General;  Laterality: Right;    Social History   Socioeconomic History  . Marital status: Married    Spouse name: Romie Minus  . Number of children: Not on file  . Years of education: Not on file  . Highest education level: Professional school degree (e.g., MD, DDS, DVM, JD)  Occupational History    Comment: retired hospital chaplain  Tobacco Use  . Smoking status: Former Research scientist (life sciences)  . Smokeless tobacco: Never Used  . Tobacco comment: as teenager  Substance and Sexual Activity  . Alcohol use: No  . Drug use: No  .  Sexual activity: Not on file  Other Topics Concern  . Not on file  Social History Narrative  . Not on file   Social Determinants of Health   Financial Resource Strain:   . Difficulty of Paying Living Expenses:   Food Insecurity:   . Worried About Charity fundraiser in the Last Year:   . Arboriculturist in the Last Year:   Transportation Needs: No Transportation Needs  . Lack of Transportation (Medical): No  . Lack of Transportation (Non-Medical): No  Physical Activity:   . Days of Exercise per Week:   . Minutes of Exercise per Session:   Stress:   . Feeling of Stress :   Social Connections:   . Frequency of Communication with Friends and Family:   . Frequency of Social  Gatherings with Friends and Family:   . Attends Religious Services:   . Active Member of Clubs or Organizations:   . Attends Archivist Meetings:   Marland Kitchen Marital Status:     Family History  Problem Relation Age of Onset  . Arthritis Mother   . Heart disease Father   . Arthritis Father   . Diabetes Maternal Aunt   . Cancer Paternal Uncle        lung  . Stroke Maternal Grandmother   . Diabetes Maternal Grandmother     Review of Systems  Constitutional: Negative for chills and fever.  Eyes: Positive for visual disturbance (blurry vision, double vision - has seen opthal- dry eyes).  Respiratory: Positive for shortness of breath (episodes of SOB - non exertional- when he lays down). Negative for cough and wheezing.   Cardiovascular: Negative for chest pain, palpitations and leg swelling.  Gastrointestinal: Negative for abdominal pain, blood in stool, constipation, diarrhea and nausea.  Genitourinary: Negative for difficulty urinating and dysuria.  Musculoskeletal: Positive for arthralgias and gait problem (poor balance).  Skin: Negative for rash.  Neurological: Positive for headaches (rare). Negative for dizziness, weakness, light-headedness and numbness.       Intermittent jerks of b/l UE's  Psychiatric/Behavioral: Positive for sleep disturbance. Negative for dysphoric mood. The patient is not nervous/anxious.        Objective:   Vitals:   02/16/20 0747  BP: 138/82  Pulse: 86  Temp: 98.1 F (36.7 C)  SpO2: 96%   Filed Weights   02/16/20 0747  Weight: 209 lb (94.8 kg)   Body mass index is 29.56 kg/m.  BP Readings from Last 3 Encounters:  02/16/20 138/82  12/14/19 140/72  12/02/19 127/73    Wt Readings from Last 3 Encounters:  02/16/20 209 lb (94.8 kg)  12/14/19 215 lb 12.8 oz (97.9 kg)  12/02/19 218 lb 3.2 oz (99 kg)     Physical Exam Constitutional: He appears well-developed and well-nourished. No distress.  HENT:  Head: Normocephalic and  atraumatic.  Right Ear: External ear normal.  Left Ear: External ear normal.  Mouth/Throat: Oropharynx is clear and moist.  Normal ear canals and TM b/l  Eyes: Conjunctivae and EOM are normal.  Neck: Neck supple. No tracheal deviation present. No thyromegaly present.  No carotid bruit  Cardiovascular: Normal rate, regular rhythm, normal heart sounds and intact distal pulses.   No murmur heard. Pulmonary/Chest: Effort normal and breath sounds normal. No respiratory distress. He has no wheezes. He has no rales.  Abdominal: Soft. He exhibits no distension. There is no tenderness.  Genitourinary: deferred  Musculoskeletal: He exhibits no edema.  Lymphadenopathy:   He  has no cervical adenopathy.  Neuro:  He does not always answer questions correctly Skin: Skin is warm and dry. He is not diaphoretic.  Psychiatric: He has a normal mood and affect. His behavior is normal.         Assessment & Plan:   Physical exam: Screening blood work  deferred Immunizations  Up to date  Colonoscopy   N/a - no longer needed due to age Eye exams   Up to date  Exercise   tries to walk everyday, yard work [employer]  Encouraged weight loss Substance abuse   none  See Problem List for Assessment and Plan of chronic medical problems.   This visit occurred during the SARS-CoV-2 public health emergency.  Safety protocols were in place, including screening questions prior to the visit, additional usage of staff PPE, and extensive cleaning of exam room while observing appropriate contact time as indicated for disinfecting solutions.

## 2020-02-15 NOTE — Patient Instructions (Addendum)
All other Health Maintenance issues reviewed.   All recommended immunizations and age-appropriate screenings are up-to-date or discussed.  No immunization administered today.   Medications reviewed and updated.  Changes include :   Add melatonin 1-2 mg at night.  Restart pravastatin 40 mg daily     Please followup in 6 months    Health Maintenance, Male Adopting a healthy lifestyle and getting preventive care are important in promoting health and wellness. Ask your health care provider about:  The right schedule for you to have regular tests and exams.  Things you can do on your own to prevent diseases and keep yourself healthy. What should I know about diet, weight, and exercise? Eat a healthy diet   Eat a diet that includes plenty of vegetables, fruits, low-fat dairy products, and lean protein.  Do not eat a lot of foods that are high in solid fats, added sugars, or sodium. Maintain a healthy weight Body mass index (BMI) is a measurement that can be used to identify possible weight problems. It estimates body fat based on height and weight. Your health care provider can help determine your BMI and help you achieve or maintain a healthy weight. Get regular exercise Get regular exercise. This is one of the most important things you can do for your health. Most adults should:  Exercise for at least 150 minutes each week. The exercise should increase your heart rate and make you sweat (moderate-intensity exercise).  Do strengthening exercises at least twice a week. This is in addition to the moderate-intensity exercise.  Spend less time sitting. Even light physical activity can be beneficial. Watch cholesterol and blood lipids Have your blood tested for lipids and cholesterol at 78 years of age, then have this test every 5 years. You may need to have your cholesterol levels checked more often if:  Your lipid or cholesterol levels are high.  You are older than 78 years of  age.  You are at high risk for heart disease. What should I know about cancer screening? Many types of cancers can be detected early and may often be prevented. Depending on your health history and family history, you may need to have cancer screening at various ages. This may include screening for:  Colorectal cancer.  Prostate cancer.  Skin cancer.  Lung cancer. What should I know about heart disease, diabetes, and high blood pressure? Blood pressure and heart disease  High blood pressure causes heart disease and increases the risk of stroke. This is more likely to develop in people who have high blood pressure readings, are of African descent, or are overweight.  Talk with your health care provider about your target blood pressure readings.  Have your blood pressure checked: ? Every 3-5 years if you are 83-14 years of age. ? Every year if you are 62 years old or older.  If you are between the ages of 65 and 24 and are a current or former smoker, ask your health care provider if you should have a one-time screening for abdominal aortic aneurysm (AAA). Diabetes Have regular diabetes screenings. This checks your fasting blood sugar level. Have the screening done:  Once every three years after age 3 if you are at a normal weight and have a low risk for diabetes.  More often and at a younger age if you are overweight or have a high risk for diabetes. What should I know about preventing infection? Hepatitis B If you have a higher risk for hepatitis  B, you should be screened for this virus. Talk with your health care provider to find out if you are at risk for hepatitis B infection. Hepatitis C Blood testing is recommended for:  Everyone born from [birth year] through 1964.  Anyone with known risk factors for hepatitis C. Sexually transmitted infections (STIs)  You should be screened each year for STIs, including gonorrhea and chlamydia, if: ? You are sexually active and are younger  than 78 years of age. ? You are older than 78 years of age and your health care provider tells you that you are at risk for this type of infection. ? Your sexual activity has changed since you were last screened, and you are at increased risk for chlamydia or gonorrhea. Ask your health care provider if you are at risk.  Ask your health care provider about whether you are at high risk for HIV. Your health care provider may recommend a prescription medicine to help prevent HIV infection. If you choose to take medicine to prevent HIV, you should first get tested for HIV. You should then be tested every 3 months for as long as you are taking the medicine. Follow these instructions at home: Lifestyle  Do not use any products that contain nicotine or tobacco, such as cigarettes, e-cigarettes, and chewing tobacco. If you need help quitting, ask your health care provider.  Do not use street drugs.  Do not share needles.  Ask your health care provider for help if you need support or information about quitting drugs. Alcohol use  Do not drink alcohol if your health care provider tells you not to drink.  If you drink alcohol: ? Limit how much you have to 0-2 drinks a day. ? Be aware of how much alcohol is in your drink. In the [place] one drink equals one 12 oz bottle of beer (355 mL), one 5 oz glass of wine (148 mL), or one 1 oz glass of hard liquor (44 mL). General instructions  Schedule regular health, dental, and eye exams.  Stay current with your vaccines.  Tell your health care provider if: ? You often feel depressed. ? You have ever been abused or do not feel safe at home. Summary  Adopting a healthy lifestyle and getting preventive care are important in promoting health and wellness.  Follow your health care provider's instructions about healthy diet, exercising, and getting tested or screened for diseases.  Follow your health care provider's instructions on monitoring your  cholesterol and blood pressure. This information is not intended to replace advice given to you by your health care provider. Make sure you discuss any questions you have with your health care provider. Document Revised: 07/29/2018 Document Reviewed: 07/29/2018 Elsevier Patient Education  2020 [employer].

## 2020-02-16 ENCOUNTER — Other Ambulatory Visit: Payer: Self-pay

## 2020-02-16 ENCOUNTER — Encounter: Payer: Self-pay | Admitting: Internal Medicine

## 2020-02-16 ENCOUNTER — Ambulatory Visit (INDEPENDENT_AMBULATORY_CARE_PROVIDER_SITE_OTHER): Payer: Medicare Other | Admitting: Internal Medicine

## 2020-02-16 VITALS — BP 138/82 | HR 86 | Temp 98.1°F | Ht 70.5 in | Wt 209.0 lb

## 2020-02-16 DIAGNOSIS — G4709 Other insomnia: Secondary | ICD-10-CM

## 2020-02-16 DIAGNOSIS — I10 Essential (primary) hypertension: Secondary | ICD-10-CM | POA: Diagnosis not present

## 2020-02-16 DIAGNOSIS — Z0000 Encounter for general adult medical examination without abnormal findings: Secondary | ICD-10-CM

## 2020-02-16 DIAGNOSIS — E782 Mixed hyperlipidemia: Secondary | ICD-10-CM

## 2020-02-16 DIAGNOSIS — M1611 Unilateral primary osteoarthritis, right hip: Secondary | ICD-10-CM

## 2020-02-16 DIAGNOSIS — Z8673 Personal history of transient ischemic attack (TIA), and cerebral infarction without residual deficits: Secondary | ICD-10-CM | POA: Diagnosis not present

## 2020-02-16 MED ORDER — PRAVASTATIN SODIUM 40 MG PO TABS: 40.0000 mg | ORAL_TABLET | Freq: Every day | ORAL | 1 refills | Status: DC

## 2020-02-16 NOTE — Assessment & Plan Note (Addendum)
Chronic H/o CVA Did not tolerate pravastatin 80 mg  --  Retry 40 mg of pravastatin daily encouraged regular exercise, weight loss

## 2020-02-16 NOTE — Assessment & Plan Note (Signed)
Chronic - started after CVA Trazodone 100 mg - helps for 3-4 hrs only Try adding melatonin 1-2 mg at night Go to bed at the same time, wake at same time If unable to get back to sleep - after 30 min go to other room and read - then try to go back to sleep Can consider Remeron in future

## 2020-02-16 NOTE — Assessment & Plan Note (Signed)
Chronic BP well controlled at home and reasonable here Current regimen effective and well tolerated Continue current medications at current doses

## 2020-02-16 NOTE — Assessment & Plan Note (Signed)
Chronic Following with ortho Improvement after an injection This does limit him some and may affect his balance

## 2020-02-16 NOTE — Assessment & Plan Note (Signed)
No residual - but since has had sleep issues, jerking movements of b/l UEs and some memory issues ( although I think this was there some before the CVA) bp controlled Cholesterol not controlled - retry pravastatin 40 mg daily Stressed regular exercise Work on [employer] loss Sees neuro in August

## 2020-02-22 ENCOUNTER — Other Ambulatory Visit: Payer: Self-pay | Admitting: Internal Medicine

## 2020-03-05 ENCOUNTER — Other Ambulatory Visit: Payer: Self-pay | Admitting: Internal Medicine

## 2020-03-30 DIAGNOSIS — M25551 Pain in right hip: Secondary | ICD-10-CM | POA: Diagnosis not present

## 2020-03-30 DIAGNOSIS — M25561 Pain in right knee: Secondary | ICD-10-CM | POA: Diagnosis not present

## 2020-04-18 ENCOUNTER — Encounter: Payer: Self-pay | Admitting: Adult Health

## 2020-04-18 ENCOUNTER — Other Ambulatory Visit: Payer: Self-pay

## 2020-04-18 ENCOUNTER — Ambulatory Visit: Payer: Medicare Other | Admitting: Adult Health

## 2020-04-18 VITALS — BP 142/76 | HR 81 | Ht 71.0 in | Wt 210.0 lb

## 2020-04-18 DIAGNOSIS — G4700 Insomnia, unspecified: Secondary | ICD-10-CM | POA: Diagnosis not present

## 2020-04-18 DIAGNOSIS — R55 Syncope and collapse: Secondary | ICD-10-CM | POA: Diagnosis not present

## 2020-04-18 DIAGNOSIS — R269 Unspecified abnormalities of gait and mobility: Secondary | ICD-10-CM

## 2020-04-18 DIAGNOSIS — I6381 Other cerebral infarction due to occlusion or stenosis of small artery: Secondary | ICD-10-CM

## 2020-04-18 DIAGNOSIS — I69319 Unspecified symptoms and signs involving cognitive functions following cerebral infarction: Secondary | ICD-10-CM

## 2020-04-18 DIAGNOSIS — E785 Hyperlipidemia, unspecified: Secondary | ICD-10-CM

## 2020-04-18 DIAGNOSIS — I639 Cerebral infarction, unspecified: Secondary | ICD-10-CM

## 2020-04-18 DIAGNOSIS — I10 Essential (primary) hypertension: Secondary | ICD-10-CM | POA: Diagnosis not present

## 2020-04-18 DIAGNOSIS — I69398 Other sequelae of cerebral infarction: Secondary | ICD-10-CM

## 2020-04-18 NOTE — Progress Notes (Signed)
I agree with the above plan 

## 2020-04-18 NOTE — Patient Instructions (Addendum)
Your Plan:  In regards to your dizziness: -May possibly be post stroke related vs related to hip pain vs other underlying etiology -Would recommend participation with neuro rehab PT for possible improvement.  May also need further evaluation with ENT  In regards to your blacking out episodes: -Potentially be seizure type activity or cardiac related such as vasovagal symptom -Would recommend EEG to rule out seizure type activity -May also need evaluation by cardiology which can be further discussed with your PCP  In regards to your cognitive concerns -Likely post stroke -Would recommend participation with neuro rehab speech therapy/cognitive therapy -Highly encourage mind exercises, routine physical activity, ensuring a healthy diet, adequate sleep, and managing stroke risk factors -Consider further evaluation with neurocognitive evaluation  In regards to your insomnia concerns -Concern for underlying sleep apnea -Would highly recommend further evaluation with our sleep specialist and potentially proceed with sleep study   Please call office if you are interested in any of the above recommendations      Thank you for coming to see [place] at [facility]. I hope we have been able to provide you high quality care today.  You may receive a patient satisfaction survey over the next few weeks. We would appreciate your feedback and comments so that we may continue to improve ourselves and the health of our patients.

## 2020-04-18 NOTE — Progress Notes (Signed)
[facility] [address] [place]. [place] [address] [phone]       STROKE FOLLOW UP NOTE  Mr. Adelfo Diebel Date of Birth:  10-25-1940 Medical Record Number:  [id number]   Reason for Referral: stroke follow up    SUBJECTIVE:   CHIEF COMPLAINT:  Chief Complaint  Patient presents with  . Follow-up    rm 5  . Cerebrovascular Accident    Pt here for a f/u from a stroke. Pt said he is doing ok. He is here with his witk Caige Almeda    HPI:   Today, 04/18/2020, Mr. 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  He also feels at times he has difficulty laying flat or will wake up gasping for air He has been experiencing dizziness since his stroke - unable to determine if he is experiencing a true dizziness sensation vs vertigo vs imbalance as he will start speaking of a different topic He does report 3 episodes of vision going black (aware of his surroundings), foggy headedness and dizziness resulting in a fall.  He did lose consciousness with 2 of those episodes.  Prior episode occurred on 6/16.  Denies dyspnea, heart palpitation or racing heart type sensation. Apparently, he has spoke to his PCP regarding above concerns but she feels as though more anxiety related He is concerned as there may be something else going on or possibly medication related  He has been experiencing greater right hip pain recently receiving injection by orthopedics with benefit Currently using a cane for ambulation  Remains on aspirin and pravastatin 40 mg daily for secondary stroke prevention without side effects Blood pressure today 142/76 Follows closely with PCP for HTN and HLD management  No further concerns at this time     History provided for reference purposes  only Update 12/02/2019 JM:, Mr. Ditullio is being seen for hospital follow-up. He has been doing well from a stroke standpoint with residual mild cognitive impairment but denies weakness or numbness/tingling. 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 He has completed 3 weeks DAPT and continues on aspirin alone without bleeding or bruising.  Continues on pravastatin 40 mg daily tolerating well without side effects.  Blood pressure today  127/73 with similar home readings. PCP recently initiated losartan 50 mg daily for elevated BP and new diagnosis of HTN.  Also reported difficulty sleeping with increased anxiety and PCP initiated trazodone 50 mg nightly and recently increased to 100 mg nightly.  No further concerns at this time.  Stroke admission 10/30/2027 Mr.[name], MDis a 79 y.o.malewith history of arthritis, aortic arthrosclerosis, and HLD presented on 10/30/2027 with left sided numbness.  Evaluated by stroke team and Dr. 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  Recommended DAPT for 3 weeks and aspirin alone.  LDL 151 and history of statin intolerance with Zocor and Lipitor and patient agreeable to trial of pravastatin 40 mg daily.  No history of HTN or DM.  No prior history of stroke.  Evaluated by therapies and recommended home health OT and SLP and was discharged home in stable condition on 11/02/2019.  Stroke:R thalamic infarct s/p IV tPA, secondary to small vessel disease  Code Stroke CT Head - No  acute intracranial abnormality.   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   MRI head -R thalamic infarct  2D Echo -EF60-65%. No source of embolus  [employer] Virus 2 - negative  LDL - 151 -initiate pravastatin 40 mg  daily -history of statin intolerance to simvastatin and atorvastatin  HgbA1c- 5.4  No antithromboticprior to admission, now onaspirin 81 mg and plavix 75 mg daily x 3 weeks then aspirin alone.  Therapy recommendations:No PT, OP OT, OP SLP -> HH OT and SLP arranged  Disposition: d/c home      ROS:   14 system review of systems performed and negative with exception of see HPI  PMH:  Past Medical History:  Diagnosis Date  . Hyperlipidemia   . Stroke [facility]) 10/2019    PSH:  Past Surgical History:  Procedure Laterality Date  . CHOLECYSTECTOMY    . INGUINAL HERNIA REPAIR Right 01/24/2017   Procedure: LAPAROSCOPIC RIGHT INGUINAL HERNIA WITH MESH;  Surgeon: Ralene Ok, MD;  Location: [facility];  Service: General;  Laterality: Right;  . INSERTION OF MESH Right 01/24/2017   Procedure: INSERTION OF MESH;  Surgeon: Ralene Ok, MD;  Location: [facility] OR;  Service: General;  Laterality: Right;    Social History:  Social History   Socioeconomic History  . Marital status: Married    Spouse name: Romie Minus  . Number of children: Not on file  . Years of education: Not on file  . Highest education level: Professional school degree (e.g., MD, DDS, DVM, JD)  Occupational History    Comment: retired hospital chaplain  Tobacco Use  . Smoking status: Former Research scientist (life sciences)  . Smokeless tobacco: Never Used  . Tobacco comment: as teenager  Substance and Sexual Activity  . Alcohol use: No  . Drug use: No  . Sexual activity: Not on file  Other Topics Concern  . Not on file  Social History Narrative  . Not on file   Social Determinants of Health   Financial Resource Strain:   . Difficulty of Paying Living Expenses: Not on file  Food Insecurity:   . Worried About Charity fundraiser in the Last Year: Not on file  . Ran Out of Food in the Last Year: Not on file  Transportation Needs: No Transportation Needs  . Lack of Transportation (Medical): No  . Lack of Transportation (Non-Medical): No   Physical Activity:   . Days of Exercise per Week: Not on file  . Minutes of Exercise per Session: Not on file  Stress:   . Feeling of Stress : Not on file  Social Connections:   . Frequency of Communication with Friends and Family: Not on file  . Frequency of Social Gatherings with Friends and Family: Not on file  . Attends Religious Services: Not on file  . Active Member of Clubs or Organizations: Not on file  . Attends Archivist Meetings: Not on file  . Marital Status: Not on file  Intimate Partner Violence:   . Fear of Current or Ex-Partner: Not on file  . Emotionally Abused: Not on file  . Physically Abused: Not on file  . Sexually Abused: Not on file    Family History:  Family History  Problem Relation Age of Onset  . Arthritis Mother   . Heart disease Father   . Arthritis Father   . Diabetes Maternal  Aunt   . Cancer Paternal Uncle        lung  . Stroke Maternal Grandmother   . Diabetes Maternal Grandmother     Medications:   Current Outpatient Medications on File Prior to Visit  Medication Sig Dispense Refill  . acetaminophen (TYLENOL) 650 MG CR tablet Take 650 mg by mouth 2 (two) times daily with a meal.    . aspirin EC 81 MG EC tablet Take 1 tablet (81 mg total) by mouth daily.    Marland Kitchen losartan (COZAAR) 50 MG tablet Take 1 tablet (50 mg total) by mouth daily. 90 tablet 1  . melatonin 1 MG TABS tablet Take 3 mg by mouth at bedtime.    . pravastatin (PRAVACHOL) 40 MG tablet TAKE 1 TABLET(40 MG) BY MOUTH DAILY AT 6 PM 30 tablet 2  . traZODone (DESYREL) 100 MG tablet TAKE 1 TABLET(100 MG) BY MOUTH AT BEDTIME 90 tablet 0  . vitamin E 1000 UNIT capsule Take 1,000 Units by mouth daily with breakfast.     No current facility-administered medications on file prior to visit.    Allergies:   Allergies  Allergen Reactions  . Simvastatin Other (See Comments)    Muscle pain  . Lipitor [Atorvastatin] Other (See Comments)    Muscle aches       OBJECTIVE:  Vitals  Vitals:   04/18/20 1021  BP: (!) 142/76  Pulse: 81  Weight: 210 lb (95.3 kg)  Height: 5\' 11"  (1.803 m)   Body mass index is 29.29 kg/m. No exam data present   Physical exam  General: well developed, well nourished, very pleasant elderly Caucasian male, seated, in no evident distress Head: head normocephalic and atraumatic.   Neck: supple with no carotid or supraclavicular bruits Cardiovascular: regular rate and rhythm, no murmurs Musculoskeletal: no deformity Skin:  no rash/petichiae Vascular:  Normal pulses all extremities   Neurologic Exam Mental Status: Awake and fully alert.  Fluent speech and language. Oriented to place and time. Recent memory impaired and remote memory appears intact. Attention span, concentration and fund of knowledge appropriate during visit. Mood and affect appropriate.  Unable to do further memory testing due to time constraints Cranial Nerves: Pupils equal, briskly reactive to light. Extraocular movements full without nystagmus. Visual fields full to confrontation. Hearing intact. Facial sensation intact. Face, tongue, palate moves normally and symmetrically.  Motor: Normal bulk and tone. Normal strength in all tested extremity muscles. Sensory.: intact to touch , pinprick , position and vibratory sensation.  Coordination: Rapid alternating movements normal in all extremities. Finger-to-nose and heel-to-shin performed accurately bilaterally. Gait and Station: Arises from chair without difficulty. Stance is normal. Gait demonstrates abnormal gait with favoring of right leg due to hip pain and use of cane Reflexes: 1+ and symmetric. Toes downgoing.       ASSESSMENT: Arjan Strohm is a 79 y.o. year old male presented with left-sided numbness on 10/30/2019 with stroke work-up revealing right chronic infarct s/p tPA secondary to small vessel disease. Vascular risk factors include HLD only and asymptomatic right vertebral  artery moderate stenosis and tandem stenosis left vertebral artery.      PLAN:  1. Right thalamic stroke:  a. Residual deficit: cognitive impairment and dizziness which has been stable without worsening.   i. May benefit from additional therapy sessions with outpatient neuro rehab as he only participated with [facility] therapy.  Also recommend neurocognitive evaluation to assess extent of cognitive impairment and possible underlying anxiety/depression contributing ii. Reports dizziness present since  his stroke.  Discussed importance with ongoing use of cane and to consider participation in outpatient PT for further hopeful improvement iii. He will call office if interested in additional therapy b. Continue aspirin 81 mg daily  and pravastatin for secondary stroke prevention.  c. Ensure close PCP follow-up for aggressive stroke risk factor management 2. HLD:  a. LDL goal<70.  Continue pravastatin.   b. Lipid panel monitoring and prescribing by PCP 3. HTN:  a. BP goal<130/90.   b. Managed by PCP. 4. Insomnia:  a. Suspicion for underlying sleep disorder with insomnia, daytime fatigue and reported gasping for air.   b. Wife also reports limb jerking and thrashing while sleeping.   c. Recommended further evaluation with [facility] sleep clinic and possibly undergoing sleep study.   d. He will further consider and call office if interested. 5. Pre syncopal/syncopal episodes:  a. Has occurring 3 times with most recent episode occurring on 6/16.   b. Potentially seizure type activity or cardiac related such as vasovagal.  Difficulty fully assessing complete symptoms.  c.  Would recommend further evaluation with EEG but may also need evaluation by cardiology which can be further discussed with PCP.   d. He will call office if interested in pursuing EEG.    Follow up in 4 months or call earlier if needed   I spent a prolonged 55 minutes of face-to-face and non-face-to-face time with patient and wife.   This included previsit chart review, lab review, study review, order entry, electronic health record documentation, patient education and discussion regarding prior stroke with residual cognitive impairment, dizziness and balance concerns, presyncopal/syncopal events and possible etiology, insomnia concerns and further evaluation, importance of managing stroke risk factors and answered all other questions to patient and wife's satisfaction   Frann Rider, AGNP-BC  [facility] [address] [place], [place] [address]  Phone [phone] Fax [phone] Note: This document was prepared with digital dictation and possible smart phrase technology. Any transcriptional errors that result from this process are unintentional.

## 2020-05-08 ENCOUNTER — Other Ambulatory Visit: Payer: Self-pay | Admitting: Internal Medicine

## 2020-05-20 ENCOUNTER — Other Ambulatory Visit: Payer: Self-pay | Admitting: Internal Medicine

## 2020-06-01 ENCOUNTER — Other Ambulatory Visit: Payer: Self-pay

## 2020-06-01 ENCOUNTER — Ambulatory Visit (INDEPENDENT_AMBULATORY_CARE_PROVIDER_SITE_OTHER): Payer: Medicare Other | Admitting: *Deleted

## 2020-06-01 DIAGNOSIS — Z23 Encounter for immunization: Secondary | ICD-10-CM | POA: Diagnosis not present

## 2020-06-12 ENCOUNTER — Encounter: Payer: Self-pay | Admitting: Internal Medicine

## 2020-06-14 ENCOUNTER — Ambulatory Visit: Payer: Medicare Other | Admitting: Internal Medicine

## 2020-07-24 DIAGNOSIS — S62307A Unspecified fracture of fifth metacarpal bone, left hand, initial encounter for closed fracture: Secondary | ICD-10-CM | POA: Diagnosis not present

## 2020-07-24 DIAGNOSIS — M25512 Pain in left shoulder: Secondary | ICD-10-CM | POA: Diagnosis not present

## 2020-07-27 DIAGNOSIS — M25512 Pain in left shoulder: Secondary | ICD-10-CM | POA: Diagnosis not present

## 2020-08-17 ENCOUNTER — Other Ambulatory Visit: Payer: Self-pay | Admitting: Internal Medicine

## 2020-08-18 DIAGNOSIS — R7303 Prediabetes: Secondary | ICD-10-CM | POA: Insufficient documentation

## 2020-08-18 DIAGNOSIS — R739 Hyperglycemia, unspecified: Secondary | ICD-10-CM | POA: Insufficient documentation

## 2020-08-18 NOTE — Progress Notes (Unsigned)
Subjective:    Patient ID: Joe Robertson, male    DOB: May 07, 1941, 79 y.o.   MRN: [id number]  HPI The patient is here for follow up of their chronic medical problems, including htn, hyperlipidemia, h/o CVA, insomnia, OA, hyperglycemia  He is taking all of his medications as prescribed.    He tries to be active during the day - walks around in the house.    Medications and allergies reviewed with patient and updated if appropriate.  Patient Active Problem List   Diagnosis Date Noted  . Hyperglycemia 08/18/2020  . Insomnia 12/13/2019  . Hypertension 11/16/2019  . Obesity 11/02/2019  . Family hx-stroke 11/02/2019  . History of stroke, right thalamic, s/p tPA, 10/2019 10/30/2019  . Localized osteoarthrosis of right hip, mod-sev 07/22/2019  . Digital mucinous cyst of finger 02/09/2019  . Chronic pain of right knee 06/17/2018  . Wears hearing aid 07/04/2016  . 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 Hyperlipidemia 06/29/2016  . 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 Vitamin D deficiency 06/29/2016  . BPH (benign prostatic hyperplasia) 06/29/2016    Current Outpatient Medications on File Prior to Visit  Medication Sig Dispense Refill  . acetaminophen (TYLENOL) 650 MG CR tablet Take 650 mg by mouth 2 (two) times daily with a meal.    . aspirin EC 81 MG EC tablet Take 1 tablet (81 mg total) by mouth daily.    Marland Kitchen losartan (COZAAR) 50 MG tablet TAKE 1 TABLET(50 MG) BY MOUTH DAILY 90 tablet 1  . pravastatin (PRAVACHOL) 40 MG tablet TAKE 1 TABLET(40 MG) BY MOUTH DAILY AT 6 PM 30 tablet 2  . vitamin E 1000 UNIT capsule Take 1,000 Units by mouth daily with breakfast.     No current facility-administered medications on file prior to visit.    Past Medical History:  Diagnosis Date  . Hyperlipidemia   . Stroke [facility]) 10/2019    Past Surgical History:  Procedure Laterality Date  . CHOLECYSTECTOMY    . INGUINAL HERNIA REPAIR Right 01/24/2017   Procedure: LAPAROSCOPIC RIGHT INGUINAL  HERNIA WITH MESH;  Surgeon: Ralene Ok, MD;  Location: [place];  Service: General;  Laterality: Right;  . INSERTION OF MESH Right 01/24/2017   Procedure: INSERTION OF MESH;  Surgeon: Ralene Ok, MD;  Location: [place];  Service: General;  Laterality: Right;    Social History   Socioeconomic History  . Marital status: Married    Spouse name: Romie Minus  . Number of children: Not on file  . Years of education: Not on file  . Highest education level: Professional school degree (e.g., MD, DDS, DVM, JD)  Occupational History    Comment: retired hospital chaplain  Tobacco Use  . Smoking status: Former Research scientist (life sciences)  . Smokeless tobacco: Never Used  . Tobacco comment: as teenager  Substance and Sexual Activity  . Alcohol use: No  . Drug use: No  . Sexual activity: Not on file  Other Topics Concern  . Not on file  Social History Narrative  . Not on file   Social Determinants of Health   Financial Resource Strain: Not on file  Food Insecurity: Not on file  Transportation Needs: No Transportation Needs  . Lack of Transportation (Medical): No  . Lack of Transportation (Non-Medical): No  Physical Activity: Not on file  Stress: Not on file  Social Connections: Not on file    Family History  Problem Relation Age of Onset  . Arthritis Mother   . Heart disease Father   .  Arthritis Father   . Diabetes Maternal Aunt   . Cancer Paternal Uncle        lung  . Stroke Maternal Grandmother   . Diabetes Maternal Grandmother     Review of Systems  Constitutional: Negative for chills and fever.  Respiratory: Negative for cough, shortness of breath and wheezing.   Cardiovascular: Negative for chest pain and leg swelling.  Neurological: Negative for light-headedness and headaches.       Objective:   Vitals:   08/21/20 0756  BP: 130/78  Pulse: (!) 58  Temp: 98 F (36.7 C)  SpO2: 98%   BP Readings from Last 3 Encounters:  08/21/20 130/78  04/18/20 (!) 142/76  02/16/20 138/82   Wt  Readings from Last 3 Encounters:  08/21/20 219 lb (99.3 kg)  04/18/20 210 lb (95.3 kg)  02/16/20 209 lb (94.8 kg)   Body mass index is 30.54 kg/m.   Physical Exam    Constitutional: Appears well-developed and well-nourished. No distress.  HENT:  Head: Normocephalic and atraumatic.  Neck: Neck supple. No tracheal deviation present. No thyromegaly present.  No cervical lymphadenopathy Cardiovascular: Normal rate, regular rhythm and normal heart sounds.   No murmur heard. No carotid bruit .  No edema Pulmonary/Chest: Effort normal and breath sounds normal. No respiratory distress. No has no wheezes. No rales.  Skin: Skin is warm and dry. Not diaphoretic.  Psychiatric: Normal mood and affect. Behavior is normal.      Assessment & Plan:    See Problem List for Assessment and Plan of chronic medical problems.    This visit occurred during the SARS-CoV-2 public health emergency.  Safety protocols were in place, including screening questions prior to the visit, additional usage of staff PPE, and extensive cleaning of exam room while observing appropriate contact time as indicated for disinfecting solutions.

## 2020-08-18 NOTE — Patient Instructions (Addendum)
°  Blood work was ordered.      Medications changes include :   None     Please followup in 6 months

## 2020-08-21 ENCOUNTER — Encounter: Payer: Self-pay | Admitting: Internal Medicine

## 2020-08-21 ENCOUNTER — Other Ambulatory Visit: Payer: Self-pay

## 2020-08-21 ENCOUNTER — Ambulatory Visit (INDEPENDENT_AMBULATORY_CARE_PROVIDER_SITE_OTHER): Payer: Medicare Other | Admitting: Internal Medicine

## 2020-08-21 VITALS — BP 130/78 | HR 58 | Temp 98.0°F | Ht 71.0 in | Wt 219.0 lb

## 2020-08-21 DIAGNOSIS — Z8673 Personal history of transient ischemic attack (TIA), and cerebral infarction without residual deficits: Secondary | ICD-10-CM

## 2020-08-21 DIAGNOSIS — G4709 Other insomnia: Secondary | ICD-10-CM

## 2020-08-21 DIAGNOSIS — M1611 Unilateral primary osteoarthritis, right hip: Secondary | ICD-10-CM

## 2020-08-21 DIAGNOSIS — I10 Essential (primary) hypertension: Secondary | ICD-10-CM

## 2020-08-21 DIAGNOSIS — R739 Hyperglycemia, unspecified: Secondary | ICD-10-CM | POA: Diagnosis not present

## 2020-08-21 DIAGNOSIS — E782 Mixed hyperlipidemia: Secondary | ICD-10-CM

## 2020-08-21 LAB — CBC WITH DIFFERENTIAL/PLATELET
Basophils Absolute: 0 10*3/uL (ref 0.0–0.1)
Basophils Relative: 0.4 % (ref 0.0–3.0)
Eosinophils Absolute: 0.4 10*3/uL (ref 0.0–0.7)
Eosinophils Relative: 4.4 % (ref 0.0–5.0)
HCT: 47.2 % (ref 39.0–52.0)
Hemoglobin: 15.8 g/dL (ref 13.0–17.0)
Lymphocytes Relative: 43.6 % (ref 12.0–46.0)
Lymphs Abs: 4.1 10*3/uL — ABNORMAL HIGH (ref 0.7–4.0)
MCHC: 33.4 g/dL (ref 30.0–36.0)
MCV: 92.4 fl (ref 78.0–100.0)
Monocytes Absolute: 1.1 10*3/uL — ABNORMAL HIGH (ref 0.1–1.0)
Monocytes Relative: 11.5 % (ref 3.0–12.0)
Neutro Abs: 3.7 10*3/uL (ref 1.4–7.7)
Neutrophils Relative %: 40.1 % — ABNORMAL LOW (ref 43.0–77.0)
Platelets: 267 10*3/uL (ref 150.0–400.0)
RBC: 5.11 Mil/uL (ref 4.22–5.81)
RDW: 14.1 % (ref 11.5–15.5)
WBC: 9.3 10*3/uL (ref 4.0–10.5)

## 2020-08-21 LAB — COMPREHENSIVE METABOLIC PANEL
ALT: 28 U/L (ref 0–53)
AST: 18 U/L (ref 0–37)
Albumin: 4.4 g/dL (ref 3.5–5.2)
Alkaline Phosphatase: 64 U/L (ref 39–117)
BUN: 15 mg/dL (ref 6–23)
CO2: 28 mEq/L (ref 19–32)
Calcium: 9.4 mg/dL (ref 8.4–10.5)
Chloride: 104 mEq/L (ref 96–112)
Creatinine, Ser: 1.17 mg/dL (ref 0.40–1.50)
GFR: 59.82 mL/min — ABNORMAL LOW (ref >60.00)
Glucose, Bld: 86 mg/dL (ref 70–99)
Potassium: 4.2 mEq/L (ref 3.5–5.1)
Sodium: 140 mEq/L (ref 135–145)
Total Bilirubin: 0.7 mg/dL (ref 0.2–1.2)
Total Protein: 7.2 g/dL (ref 6.0–8.3)

## 2020-08-21 LAB — LIPID PANEL
Cholesterol: 192 mg/dL (ref 0–200)
HDL: 36.7 mg/dL — ABNORMAL LOW (ref >39.00)
LDL Cholesterol: 124 mg/dL — ABNORMAL HIGH (ref 0–99)
NonHDL: 155.38
Total CHOL/HDL Ratio: 5
Triglycerides: 155 mg/dL — ABNORMAL HIGH (ref 0.0–149.0)
VLDL: 31 mg/dL (ref 0.0–40.0)

## 2020-08-21 LAB — HEMOGLOBIN A1C: Hgb A1c MFr Bld: 5.6 % (ref 4.6–6.5)

## 2020-08-21 MED ORDER — LOSARTAN POTASSIUM 50 MG PO TABS: ORAL_TABLET | ORAL | 1 refills | Status: DC

## 2020-08-21 MED ORDER — PRAVASTATIN SODIUM 40 MG PO TABS: ORAL_TABLET | ORAL | 1 refills | Status: DC

## 2020-08-21 NOTE — Assessment & Plan Note (Addendum)
Chronic BP well controlled Continue losartan 50 mg daily Cmp, cbc

## 2020-08-21 NOTE — Assessment & Plan Note (Addendum)
Chronic Stopped trazodone and melatonin Takes tylenol PM on occasion - encouraged good sleep hygiene, avoiding daytime naps and regular exercise.  Encouraged taking Tylenol PM rarely

## 2020-08-21 NOTE — Assessment & Plan Note (Addendum)
Chronic No residual symptoms BP adequately controlled On pravastatin - check lipids, cmp Encouraged regular exercise, weight loss Continue ASA 81 mg daily, losartan 50 mg daily and pravastatin 40 mg daily

## 2020-08-21 NOTE — Assessment & Plan Note (Signed)
Chronic Check lipid panel  Continue pravastatin 40 mg daily LDL goal < 70 Regular exercise and healthy diet encouraged

## 2020-08-21 NOTE — Assessment & Plan Note (Signed)
Chronic Pain controlled Continue tylenol prn

## 2020-08-21 NOTE — Assessment & Plan Note (Signed)
Chronic Check a1c Low sugar / carb diet Stressed regular exercise  

## 2020-08-23 ENCOUNTER — Encounter: Payer: Self-pay | Admitting: Internal Medicine

## 2020-08-23 ENCOUNTER — Ambulatory Visit: Payer: Medicare Other | Admitting: Adult Health

## 2020-08-23 ENCOUNTER — Encounter: Payer: Self-pay | Admitting: Adult Health

## 2020-08-23 VITALS — BP 134/74 | HR 99 | Ht 71.0 in | Wt 217.8 lb

## 2020-08-23 DIAGNOSIS — I639 Cerebral infarction, unspecified: Secondary | ICD-10-CM | POA: Diagnosis not present

## 2020-08-23 DIAGNOSIS — I10 Essential (primary) hypertension: Secondary | ICD-10-CM

## 2020-08-23 DIAGNOSIS — E782 Mixed hyperlipidemia: Secondary | ICD-10-CM

## 2020-08-23 DIAGNOSIS — I6381 Other cerebral infarction due to occlusion or stenosis of small artery: Secondary | ICD-10-CM

## 2020-08-23 DIAGNOSIS — E785 Hyperlipidemia, unspecified: Secondary | ICD-10-CM

## 2020-08-23 NOTE — Patient Instructions (Signed)
Continue aspirin 81 mg daily  and initiate Crestor as advised by Dr. Lawerance Bach  for secondary stroke prevention   Continue to follow up with PCP regarding cholesterol and blood pressure management  Maintain strict control of hypertension with blood pressure goal below 130/90 and cholesterol with LDL cholesterol (bad cholesterol) goal below 70 mg/dL.       Followup in the future with me in 6 months or call earlier if needed       Thank you for coming to see [place] at [facility]. I hope we have been able to provide you high quality care today.  You may receive a patient satisfaction survey over the next few weeks. We would appreciate your feedback and comments so that we may continue to improve ourselves and the health of our patients.

## 2020-08-23 NOTE — Progress Notes (Signed)
I agree with the above plan 

## 2020-08-23 NOTE — Progress Notes (Signed)
[facility] [address] [place]. [place] [address] [phone]       STROKE FOLLOW UP NOTE  [name] Date of Birth:  1940-10-24 Medical Record Number:  [id number]   Reason for Referral: stroke follow up    SUBJECTIVE:   CHIEF COMPLAINT:  Chief Complaint  Patient presents with  . Follow-up    He is here with his wife, Steward Drone, for his four month follow up from CVA. Reports dizziness has improved. No further black out spells. Feels memory has returned to baseline. No issues now with falling asleep.    HPI:   Today, 08/23/2020, Mr. [name] returns for 50-month stroke follow-up accompanied by his wife.  He has been doing very well since prior visit with resolution of prior complaints including dizziness, insomnia and cognitive concerns.  He has unfortunately had a couple falls the first week of December.  Both of them in setting of slipping on a slick surface and unfortunately resulted in left hand fracture but otherwise no other injuries.  Denies falls occurring due to dizziness or lack of balance.  Denies new stroke/TIA symptoms.  Remains on aspirin and pravastatin 40 mg daily for secondary stroke prevention without side effects.  Lipid panel obtained by PCP 08/21/2020 with LDL 124 therefore Dr. Lawerance Bach plans on trialing Crestor as he has had difficulty tolerating high-dose statins.  Blood pressure today 134/74. Monitors at home which has been stable and similar to todays reading.  No concerns at this time.    History provided for reference purposes only Update 04/18/2020 JM: 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  He also feels at times he has difficulty laying flat or will wake up gasping for air He has been experiencing dizziness since his stroke - unable  to determine if he is experiencing a true dizziness sensation vs vertigo vs imbalance as he will start speaking of a different topic He does report 3 episodes of vision going black (aware of his surroundings), foggy headedness and dizziness resulting in a fall.  He did lose consciousness with 2 of those episodes.  Prior episode occurred on 6/16.  Denies dyspnea, heart palpitation or racing heart type sensation. Apparently, he has spoke to his PCP regarding above concerns but she feels as though more anxiety related He is concerned as there may be something else going on or possibly medication related He has been experiencing greater right hip pain recently receiving injection by orthopedics with benefit Currently using a cane for ambulation Remains on aspirin and pravastatin 40 mg daily for secondary stroke prevention without side effects Blood pressure today 142/76 Follows closely with PCP for HTN and HLD management No further concerns at this time  Update 12/02/2019 JM:, [name] is being seen for hospital follow-up. He has been doing well from a stroke standpoint with residual mild cognitive impairment but denies weakness or numbness/tingling. 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 He has completed 3 weeks DAPT and continues on aspirin alone without bleeding or bruising.  Continues on pravastatin 40 mg daily tolerating well without side effects.  Blood pressure today  127/73 with similar home readings. PCP recently initiated losartan 50 mg daily for elevated BP and new diagnosis of HTN.  Also reported difficulty sleeping  with increased anxiety and PCP initiated trazodone 50 mg nightly and recently increased to 100 mg nightly.  No further concerns at this time.  Stroke admission 10/30/2027 Mr.Joe Robertson, MDis a 80 y.o.malewith history of arthritis, aortic arthrosclerosis, and HLD presented on 10/30/2027  with left sided numbness.  Evaluated by stroke team and Dr. 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  Recommended DAPT for 3 weeks and aspirin alone.  LDL 151 and history of statin intolerance with Zocor and Lipitor and patient agreeable to trial of pravastatin 40 mg daily.  No history of HTN or DM.  No prior history of stroke.  Evaluated by therapies and recommended home health OT and SLP and was discharged home in stable condition on 11/02/2019.  Stroke:R thalamic infarct s/p IV tPA, secondary to small vessel disease  Code Stroke CT Head - No acute intracranial abnormality.   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   MRI head -R thalamic infarct  2D Echo -EF60-65%. No source of embolus  [employer] Virus 2 - negative  LDL - 151 -initiate pravastatin 40 mg daily -history of statin intolerance to simvastatin and atorvastatin  HgbA1c- 5.4  No antithromboticprior to admission, now onaspirin 81 mg and plavix 75 mg daily x 3 weeks then aspirin alone.  Therapy recommendations:No PT, OP OT, OP SLP -> HH OT and SLP arranged  Disposition: d/c home      ROS:   14 system review of systems performed and negative with exception of see HPI  PMH:  Past Medical History:  Diagnosis Date  . Hyperlipidemia   . Stroke [facility]) 10/2019    PSH:  Past Surgical History:  Procedure Laterality Date  . CHOLECYSTECTOMY    . INGUINAL HERNIA REPAIR Right 01/24/2017   Procedure: LAPAROSCOPIC RIGHT INGUINAL HERNIA WITH MESH;  Surgeon: Joe Ok, MD;  Location: [place];  Service: General;  Laterality: Right;  . INSERTION OF MESH Right 01/24/2017   Procedure: INSERTION OF MESH;  Surgeon: Joe Ok, MD;  Location: [facility];  Service: General;  Laterality: Right;    Social History:  Social History    Socioeconomic History  . Marital status: Married    Spouse name: Romie Robertson  . Number of children: Not on file  . Years of education: Not on file  . Highest education level: Professional school degree (e.g., MD, DDS, DVM, JD)  Occupational History    Comment: retired hospital chaplain  Tobacco Use  . Smoking status: Former Research scientist (life sciences)  . Smokeless tobacco: Never Used  . Tobacco comment: as teenager  Substance and Sexual Activity  . Alcohol use: No  . Drug use: No  . Sexual activity: Not on file  Other Topics Concern  . Not on file  Social History Narrative  . Not on file   Social Determinants of Health   Financial Resource Strain: Not on file  Food Insecurity: Not on file  Transportation Needs: No Transportation Needs  . Lack of Transportation (Medical): No  . Lack of Transportation (Non-Medical): No  Physical Activity: Not on file  Stress: Not on file  Social Connections: Not on file  Intimate Partner Violence: Not on file    Family History:  Family History  Problem Relation Age of Onset  . Arthritis Mother   . Heart disease Father   . Arthritis  Father   . Diabetes Maternal Aunt   . Cancer Paternal Uncle        lung  . Stroke Maternal Grandmother   . Diabetes Maternal Grandmother     Medications:   Current Outpatient Medications on File Prior to Visit  Medication Sig Dispense Refill  . acetaminophen (TYLENOL) 650 MG CR tablet Take 650 mg by mouth 2 (two) times daily with a meal.    . aspirin EC 81 MG EC tablet Take 1 tablet (81 mg total) by mouth daily.    Marland Kitchen losartan (COZAAR) 50 MG tablet TAKE 1 TABLET(50 MG) BY MOUTH DAILY 90 tablet 1  . pravastatin (PRAVACHOL) 40 MG tablet TAKE 1 TABLET(40 MG) BY MOUTH DAILY AT 6 PM 90 tablet 1  . vitamin E 1000 UNIT capsule Take 1,000 Units by mouth daily with breakfast.     No current facility-administered medications on file prior to visit.    Allergies:   Allergies  Allergen Reactions  . Simvastatin Other (See Comments)     Muscle pain  . Lipitor [Atorvastatin] Other (See Comments)    Muscle aches      OBJECTIVE:  Vitals  Vitals:   08/23/20 0829  BP: 134/74  Pulse: 99  Weight: 217 lb 12.8 oz (98.8 kg)  Height: 5\' 11"  (1.803 m)   Body mass index is 30.38 kg/m. No exam data present   Physical exam  General: well developed, well nourished, very pleasant elderly Caucasian male, seated, in no evident distress Head: head normocephalic and atraumatic.   Neck: supple with no carotid or supraclavicular bruits Cardiovascular: regular rate and rhythm, no murmurs Musculoskeletal: no deformity Skin:  no rash/petichiae Vascular:  Normal pulses all extremities   Neurologic Exam Mental Status: Awake and fully alert.  Fluent speech and language. Oriented to place and time. Recent memory impaired and remote memory appears intact. Attention span, concentration and fund of knowledge appropriate during visit. Mood and affect appropriate.  Unable to do further memory testing due to time constraints Cranial Nerves: Pupils equal, briskly reactive to light. Extraocular movements full without nystagmus. Visual fields full to confrontation. Hearing intact. Facial sensation intact. Face, tongue, palate moves normally and symmetrically.  Motor: Normal bulk and tone. Normal strength in all tested extremity muscles. Sensory.: intact to touch , pinprick , position and vibratory sensation.  Coordination: Rapid alternating movements normal in all extremities. Finger-to-nose and heel-to-shin performed accurately bilaterally. Gait and Station: Arises from chair without difficulty. Stance is normal. Gait demonstrates  normal stride length and balance without use of assistive device Reflexes: 1+ and symmetric. Toes downgoing.       ASSESSMENT: Ashaz Brems is a 80 y.o. year old male presented with left-sided numbness on 10/30/2019 with stroke work-up revealing right chronic infarct s/p tPA secondary to small vessel  disease. 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     PLAN:  1. Right thalamic stroke:  a. Recovered well without residual deficits b. Continue aspirin 81 mg daily  and plans on initiating Crestor (currently on pravastatin) for secondary stroke prevention.  c. Discussed secondary stroke prevention measures and importance of close PCP follow-up for aggressive stroke risk factor management 2. HLD:  a. LDL goal<70.  Recent LDL 124 (08/21/2020) on pravastatin 40 mg daily.  Dr. Quay Burow plans on initiating Crestor in setting of elevated LDL and history of high-dose statin intolerance 3. HTN:  a. BP goal<130/90.  Stable on losartan  per PCP    Follow up in 6 months or call earlier if needed   CC:  [facility] provider: Dr. Dellia Beckwith, Claudina Lick, MD     I spent a prolonged 30 minutes of face-to-face and non-face-to-face time with patient and wife.  This included previsit chart review, lab review, study review, order entry, electronic health record documentation, patient education and discussion regarding prior stroke, resolution of prior multiple complaints including dizziness, memory concerns and insomnia, importance of managing stroke risk factors, and answered all other questions to patient and wife satisfaction   Frann Rider, AGNP-BC  [facility] [address] [employer] [place], [place] [address]  Phone [phone] Fax [phone] Note: This document was prepared with digital dictation and possible smart phrase technology. Any transcriptional errors that result from this process are unintentional.

## 2020-08-24 DIAGNOSIS — S62307D Unspecified fracture of fifth metacarpal bone, left hand, subsequent encounter for fracture with routine healing: Secondary | ICD-10-CM | POA: Diagnosis not present

## 2020-08-24 DIAGNOSIS — M25512 Pain in left shoulder: Secondary | ICD-10-CM | POA: Diagnosis not present

## 2020-08-24 MED ORDER — ROSUVASTATIN CALCIUM 20 MG PO TABS: 20.0000 mg | ORAL_TABLET | Freq: Every day | ORAL | 3 refills | Status: DC

## 2020-08-25 ENCOUNTER — Other Ambulatory Visit: Payer: Self-pay

## 2020-08-25 MED ORDER — ROSUVASTATIN CALCIUM 20 MG PO TABS: 20.0000 mg | ORAL_TABLET | Freq: Every day | ORAL | 3 refills | Status: DC

## 2020-09-14 DIAGNOSIS — S62307D Unspecified fracture of fifth metacarpal bone, left hand, subsequent encounter for fracture with routine healing: Secondary | ICD-10-CM | POA: Diagnosis not present

## 2020-10-02 ENCOUNTER — Encounter: Payer: Self-pay | Admitting: Internal Medicine

## 2020-10-11 ENCOUNTER — Other Ambulatory Visit (INDEPENDENT_AMBULATORY_CARE_PROVIDER_SITE_OTHER): Payer: Medicare Other

## 2020-10-11 DIAGNOSIS — E782 Mixed hyperlipidemia: Secondary | ICD-10-CM | POA: Diagnosis not present

## 2020-10-11 LAB — LIPID PANEL
Cholesterol: 115 mg/dL (ref 0–200)
HDL: 39.5 mg/dL (ref >39.00)
LDL Cholesterol: 53 mg/dL (ref 0–99)
NonHDL: 75.7
Total CHOL/HDL Ratio: 3
Triglycerides: 113 mg/dL (ref 0.0–149.0)
VLDL: 22.6 mg/dL (ref 0.0–40.0)

## 2020-10-11 LAB — HEPATIC FUNCTION PANEL
ALT: 26 U/L (ref 0–53)
AST: 20 U/L (ref 0–37)
Albumin: 4.1 g/dL (ref 3.5–5.2)
Alkaline Phosphatase: 54 U/L (ref 39–117)
Bilirubin, Direct: 0.1 mg/dL (ref 0.0–0.3)
Total Bilirubin: 0.7 mg/dL (ref 0.2–1.2)
Total Protein: 7.3 g/dL (ref 6.0–8.3)

## 2020-10-12 DIAGNOSIS — S62307D Unspecified fracture of fifth metacarpal bone, left hand, subsequent encounter for fracture with routine healing: Secondary | ICD-10-CM | POA: Diagnosis not present

## 2020-10-27 ENCOUNTER — Encounter: Payer: Self-pay | Admitting: Internal Medicine

## 2020-10-30 DIAGNOSIS — M25562 Pain in left knee: Secondary | ICD-10-CM | POA: Diagnosis not present

## 2020-11-02 MED ORDER — ROSUVASTATIN CALCIUM 5 MG PO TABS: 5.0000 mg | ORAL_TABLET | ORAL | 3 refills | Status: DC

## 2020-11-02 NOTE — Addendum Note (Signed)
Addended by: Binnie Rail on: 11/02/2020 09:40 PM   Modules accepted: Orders

## 2021-01-29 DIAGNOSIS — H25813 Combined forms of age-related cataract, bilateral: Secondary | ICD-10-CM | POA: Diagnosis not present

## 2021-02-20 ENCOUNTER — Ambulatory Visit: Payer: Medicare Other | Admitting: Internal Medicine

## 2021-02-21 ENCOUNTER — Ambulatory Visit: Payer: Medicare Other | Admitting: Adult Health

## 2021-02-21 ENCOUNTER — Encounter: Payer: Self-pay | Admitting: Adult Health

## 2021-02-21 VITALS — BP 139/73 | HR 92 | Ht 70.0 in | Wt 217.0 lb

## 2021-02-21 DIAGNOSIS — I639 Cerebral infarction, unspecified: Secondary | ICD-10-CM

## 2021-02-21 DIAGNOSIS — I10 Essential (primary) hypertension: Secondary | ICD-10-CM | POA: Diagnosis not present

## 2021-02-21 DIAGNOSIS — E785 Hyperlipidemia, unspecified: Secondary | ICD-10-CM | POA: Diagnosis not present

## 2021-02-21 DIAGNOSIS — I69319 Unspecified symptoms and signs involving cognitive functions following cerebral infarction: Secondary | ICD-10-CM | POA: Diagnosis not present

## 2021-02-21 DIAGNOSIS — I6381 Other cerebral infarction due to occlusion or stenosis of small artery: Secondary | ICD-10-CM

## 2021-02-21 NOTE — Progress Notes (Signed)
[facility] [address] [place]. [place] [address] [phone]       STROKE FOLLOW UP NOTE  [name] Date of Birth:  November 25, 1940 Medical Record Number:  [id number]   Reason for Referral: stroke follow up    SUBJECTIVE:   CHIEF COMPLAINT:  Chief Complaint  Patient presents with   Follow-up    RM 3 with spouse Joe Robertson Pt is well, states he still has some dizziness and memory decline      HPI:   Today, 02/21/2021, Joe Robertson returns for 28-month stroke follow-up accompanied by his wife, Joe Robertson.  Stable since prior visit without new stroke/TIA symptoms.  He does report continued intermittent dizziness which has been present since his stroke as well as short-term memory loss which can fluctuate but denies specific worsening.  He continues to ambulate with a cane and denies any recent falls.  He stays active during the day and routinely does memory exercises.  He reports he sleeps well at night and has a good appetite. Reports compliance on aspirin and Crestor 5 mg three times weekly without associated side effects.  LDL 53 down from 124 on 10/11/2020.  Blood pressure today 139/70.  No further concerns at this time.    History provided for reference purposes only Update 08/23/2020 JM: Joe Robertson returns for 56-month stroke follow-up accompanied by his wife.  He has been doing very well since prior visit with resolution of prior complaints including dizziness, insomnia and cognitive concerns.  He has unfortunately had a couple falls the first week of December.  Both of them in setting of slipping on a slick surface and unfortunately resulted in left hand fracture but otherwise no other injuries.  Denies falls occurring due to dizziness or lack of balance.  Denies new stroke/TIA symptoms.  Remains on aspirin and pravastatin 40 mg daily for secondary stroke prevention without side effects.  Lipid panel obtained by PCP 08/21/2020 with LDL 124 therefore Dr. Quay Burow  plans on trialing Crestor as he has had difficulty tolerating high-dose statins.  Blood pressure today 134/74. Monitors at home which has been stable and similar to todays reading.  No concerns at this time.  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  He also feels at times he has difficulty laying flat or will wake up gasping for air He has been experiencing dizziness since his stroke - unable to determine if he is experiencing a true dizziness sensation vs vertigo vs imbalance as he will start speaking of a different topic He does report 3 episodes of vision going black (aware of his surroundings), foggy headedness and dizziness resulting in a fall.  He did lose consciousness with 2 of those episodes.  Prior episode occurred on 6/16.  Denies dyspnea, heart palpitation or racing heart type sensation. Apparently, he has spoke to his PCP regarding above concerns but she feels as though more anxiety related He is concerned as there may be something else going on or possibly medication related He has been experiencing greater right hip pain recently receiving injection by orthopedics with benefit Currently using a cane for ambulation Remains on aspirin and pravastatin 40 mg daily for secondary stroke prevention without side effects Blood pressure today 142/76 Follows closely with PCP for HTN and HLD management No further concerns at this  time  Update 12/02/2019 JM:, Joe Robertson is being seen for hospital follow-up. He has been doing well from a stroke standpoint with residual mild cognitive impairment but denies weakness or numbness/tingling. 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 He has completed 3  weeks DAPT and continues on aspirin alone without bleeding or bruising.  Continues on pravastatin 40 mg daily tolerating well without side effects.  Blood pressure today  127/73 with similar home readings. PCP recently initiated losartan 50 mg daily for elevated BP and new diagnosis of HTN.  Also reported difficulty sleeping with increased anxiety and PCP initiated trazodone 50 mg nightly and recently increased to 100 mg nightly.  No further concerns at this time.  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  Evaluated by stroke team and Dr. 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  Recommended DAPT for 3 weeks and aspirin alone.  LDL 151 and history of statin intolerance with Zocor and Lipitor and patient agreeable to trial of pravastatin 40 mg daily.  No history of HTN or DM.  No prior history of stroke.  Evaluated by therapies and recommended home health OT and SLP and was discharged home in stable condition on 11/02/2019.  Stroke:  R thalamic infarct s/p IV tPA, secondary to small vessel disease Code Stroke CT Head - No acute intracranial abnormality.  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  MRI head - R thalamic infarct  2D Echo - EF 60-65%. No source of embolus  [employer] Virus 2 - negative LDL - 151 -initiate pravastatin 40 mg daily -history of statin intolerance to simvastatin and atorvastatin HgbA1c - 5.4 No antithrombotic prior to admission, now on aspirin 81 mg and plavix 75 mg daily x 3 weeks then aspirin alone.  Therapy recommendations:  No PT, OP OT, OP SLP -> HH OT and SLP arranged Disposition:  d/c home      ROS:   14 system review of systems performed and  negative with exception of see HPI  PMH:  Past Medical History:  Diagnosis Date   Hyperlipidemia    Stroke ([place]) 10/2019    PSH:  Past Surgical History:  Procedure Laterality Date   CHOLECYSTECTOMY     INGUINAL HERNIA REPAIR Right 01/24/2017   Procedure: LAPAROSCOPIC RIGHT INGUINAL HERNIA WITH MESH;  Surgeon: Ralene Ok, MD;  Location: [facility];  Service: General;  Laterality: Right;   INSERTION OF MESH Right 01/24/2017   Procedure: INSERTION OF MESH;  Surgeon: Ralene Ok, MD;  Location: [place];  Service: General;  Laterality: Right;    Social History:  Social History   Socioeconomic History   Marital status: Married    Spouse name: Joe Robertson   Number of children: Not on file   Years of education: Not on file   Highest education level: Professional school degree (e.g., MD, DDS, DVM, JD)  Occupational History    Comment: retired Geophysicist/field seismologist  Tobacco Use   Smoking status: Former    Pack years: 0.00   Smokeless tobacco: Never   Tobacco comments:    as teenager  Substance and Sexual Activity   Alcohol use:  No   Drug use: No   Sexual activity: Not on file  Other Topics Concern   Not on file  Social History Narrative   Not on file   Social Determinants of Health   Financial Resource Strain: Not on file  Food Insecurity: Not on file  Transportation Needs: Not on file  Physical Activity: Not on file  Stress: Not on file  Social Connections: Not on file  Intimate Partner Violence: Not on file    Family History:  Family History  Problem Relation Age of Onset   Arthritis Mother    Heart disease Father    Arthritis Father    Diabetes Maternal Aunt    Cancer Paternal Uncle        lung   Stroke Maternal Grandmother    Diabetes Maternal Grandmother     Medications:   Current Outpatient Medications on File Prior to Visit  Medication Sig Dispense Refill   acetaminophen (TYLENOL) 650 MG CR tablet Take 650 mg by mouth daily as needed.     aspirin EC 81 MG EC  tablet Take 1 tablet (81 mg total) by mouth daily.     losartan (COZAAR) 50 MG tablet TAKE 1 TABLET(50 MG) BY MOUTH DAILY 90 tablet 1   rosuvastatin (CRESTOR) 5 MG tablet Take 1 tablet (5 mg total) by mouth 3 (three) times a week. 13 tablet 3   vitamin E 1000 UNIT capsule Take 1,000 Units by mouth daily with breakfast.     No current facility-administered medications on file prior to visit.    Allergies:   Allergies  Allergen Reactions   Simvastatin Other (See Comments)    Muscle pain   Lipitor [Atorvastatin] Other (See Comments)    Muscle aches      OBJECTIVE:  Vitals  Vitals:   02/21/21 0814  BP: 139/73  Pulse: 92  Weight: 217 lb (98.4 kg)  Height: 5\' 10"  (1.778 m)    Body mass index is 31.14 kg/m. No results found.   Physical exam  General: well developed, well nourished, very pleasant elderly Caucasian male, seated, in no evident distress Head: head normocephalic and atraumatic.   Neck: supple with no carotid or supraclavicular bruits Cardiovascular: regular rate and rhythm, no murmurs Musculoskeletal: no deformity Skin:  no rash/petichiae Vascular:  Normal pulses all extremities   Neurologic Exam Mental Status: Awake and fully alert.  Fluent speech and language. Oriented to place and time. Recent memory impaired and remote memory appears intact. Attention span, concentration and fund of knowledge appropriate during visit. Mood and affect appropriate. Recall: 0/3. 4 legged Animal naming 8 in 60 seconds. Serial addition good.  Cranial Nerves: Pupils equal, briskly reactive to light. Extraocular movements full without nystagmus. Visual fields full to confrontation. Hearing intact. Facial sensation intact. Face, tongue, palate moves normally and symmetrically.  Motor: Normal bulk and tone. Normal strength in all tested extremity muscles. Sensory.: intact to touch , pinprick , position and vibratory sensation.  Coordination: Rapid alternating movements normal in all  extremities. Finger-to-nose and heel-to-shin performed accurately bilaterally. Gait and Station: Arises from chair without difficulty. Stance is normal. Gait demonstrates  normal stride length and balance without use of assistive device (carrying cane) Reflexes: 1+ and symmetric. Toes downgoing.       ASSESSMENT: Joe Robertson is a 80 y.o. year old male presented with left-sided numbness on 10/30/2019 with stroke work-up revealing right chronic infarct s/p tPA secondary to small vessel disease. Vascular risk factors include HLD only and  asymptomatic right vertebral artery moderate stenosis and tandem stenosis left vertebral artery.     PLAN:  Right thalamic stroke:  Residual deficits: Intermittent dizziness and cognitive impairment -relatively stable Discussed importance of routine physical activity, adequate sleep, routine memory exercises and ensuring adequate control of stroke risk factors.  Discussed obtaining lab work (dementia panel) to rule out reversible causes but he plans on seeing PCP Dr. Quay Burow next week for lab work and will request lab work to be completed at that time. Discussed possibility of underlying sleep apnea but he continues to decline further evaluation.  Education provided regarding untreated sleep apnea.  He will call office if he wishes in pursuing further testing. May consider use of Namenda or Aricept in the future but at this present time, will likely not be beneficial Continue aspirin 81 mg daily  and Crestor for secondary stroke prevention.  Discussed secondary stroke prevention measures and importance of close PCP follow-up for aggressive stroke risk factor management HLD:  LDL goal<70.  Prior LDL 53 down from 124 in 09/2020 on Crestor 5 mg 3 times weekly. Plans on repeating lipid panel next week with Dr. Quay Burow PCP.  Intolerance to high-dose statins HTN:  BP goal<130/90.  Stable on losartan per PCP    Follow up in 6 months or call earlier if needed   CC:   [facility] provider: Dr. Dellia Beckwith, Claudina Lick, MD     I spent 34 minutes of face-to-face and non-face-to-face time with patient and wife.  This included previsit chart review, lab review, study review, electronic health record documentation, patient and wife education and discussion regarding prior stroke as well as secondary stroke prevention measures and aggressive stroke risk factor management, intermittent dizziness and memory concerns post stroke and answered all other questions to patient and wife satisfaction  Frann Rider, AGNP-BC  [facility] [address] [place],  [address]  Phone [phone] Fax [phone] Note: This document was prepared with digital dictation and possible smart phrase technology. Any transcriptional errors that result from this process are unintentional.

## 2021-02-21 NOTE — Patient Instructions (Signed)
Continue to do exercises at home for memory loss as well as staying active, getting good sleep and managing stress levels. Have lab work complete next week with your primary care provider including B12 levels and thyroid levels (I will send requested tests to Dr. Quay Burow today)  Continue aspirin 81 mg daily  and Crestor 5 mg daily for secondary stroke prevention  Continue to follow up with PCP regarding cholesterol and blood pressure management  Maintain strict control of hypertension with blood pressure goal below 130/90 and cholesterol with LDL cholesterol (bad cholesterol) goal below 70 mg/dL.       Followup in the future with me in 6 months or call earlier if needed       Thank you for coming to see [place] at [facility]. I hope we have been able to provide you high quality care today.  You may receive a patient satisfaction survey over the next few weeks. We would appreciate your feedback and comments so that we may continue to improve ourselves and the health of our patients.

## 2021-02-26 NOTE — Patient Instructions (Addendum)
  Blood work was ordered.     Medications changes include :   none   A referral was ordered for dermatology.       Someone from their office will call you to schedule an appointment.    Please followup in 6 months

## 2021-02-26 NOTE — Progress Notes (Signed)
Subjective:    Patient ID: Joe Robertson, male    DOB: [birth year], 80 y.o.   MRN: [id number]  HPI The patient is here for follow up of their chronic medical problems, including htn, hld, h/o CVA, OA, hyperglycemia  He is taking all of his medications as prescribed.   He is active - walks around the house - inside and out.  He has two skin areas of concern.   Medications and allergies reviewed with patient and updated if appropriate.  Patient Active Problem List   Diagnosis Date Noted   Hyperglycemia 08/18/2020   Insomnia 12/13/2019   Hypertension 11/16/2019   Obesity 11/02/2019   Family hx-stroke 11/02/2019   History of stroke, right thalamic, s/p tPA, 10/2019 10/30/2019   Localized osteoarthrosis of right hip, mod-sev 07/22/2019   Digital mucinous cyst of finger 02/09/2019   Chronic pain of right knee 06/17/2018   Wears hearing aid 07/04/2016   History of nephrolithiasis 07/03/2016   Hyperlipidemia 06/29/2016   Primary osteoarthritis 06/29/2016   Vitamin D deficiency 06/29/2016   BPH (benign prostatic hyperplasia) 06/29/2016    Current Outpatient Medications on File Prior to Visit  Medication Sig Dispense Refill   acetaminophen (TYLENOL) 650 MG CR tablet Take 650 mg by mouth daily as needed.     aspirin EC 81 MG EC tablet Take 1 tablet (81 mg total) by mouth daily.     losartan (COZAAR) 50 MG tablet TAKE 1 TABLET(50 MG) BY MOUTH DAILY 90 tablet 1   vitamin E 1000 UNIT capsule Take 1,000 Units by mouth daily with breakfast.     meloxicam (MOBIC) 15 MG tablet Take 15 mg by mouth daily.     No current facility-administered medications on file prior to visit.    Past Medical History:  Diagnosis Date   Hyperlipidemia    Stroke ([place]) 10/2019    Past Surgical History:  Procedure Laterality Date   CHOLECYSTECTOMY     INGUINAL HERNIA REPAIR Right 01/24/2017   Procedure: LAPAROSCOPIC RIGHT INGUINAL HERNIA WITH MESH;  Surgeon: Ralene Ok, MD;  Location: [place];   Service: General;  Laterality: Right;   INSERTION OF MESH Right 01/24/2017   Procedure: INSERTION OF MESH;  Surgeon: Ralene Ok, MD;  Location: [facility];  Service: General;  Laterality: Right;    Social History   Socioeconomic History   Marital status: Married    Spouse name: Romie Minus   Number of children: Not on file   Years of education: Not on file   Highest education level: Professional school degree (e.g., MD, DDS, DVM, JD)  Occupational History    Comment: retired Geophysicist/field seismologist  Tobacco Use   Smoking status: Former    Pack years: 0.00   Smokeless tobacco: Never   Tobacco comments:    as teenager  Substance and Sexual Activity   Alcohol use: No   Drug use: No   Sexual activity: Not on file  Other Topics Concern   Not on file  Social History Narrative   Not on file   Social Determinants of Health   Financial Resource Strain: Not on file  Food Insecurity: Not on file  Transportation Needs: Not on file  Physical Activity: Not on file  Stress: Not on file  Social Connections: Not on file    Family History  Problem Relation Age of Onset   Arthritis Mother    Heart disease Father    Arthritis Father    Diabetes Maternal Aunt    Cancer  Paternal Uncle        lung   Stroke Maternal Grandmother    Diabetes Maternal Grandmother     Review of Systems  Constitutional:  Negative for chills and fever.  Respiratory:  Negative for cough, shortness of breath and wheezing.   Cardiovascular:  Negative for chest pain and leg swelling.  Neurological:  Positive for dizziness (since CVA). Negative for light-headedness and headaches.      Objective:   Vitals:   02/27/21 0840  BP: 130/74  Pulse: 81  Temp: 98.1 F (36.7 C)  SpO2: 97%   BP Readings from Last 3 Encounters:  02/27/21 130/74  02/21/21 139/73  08/23/20 134/74   Wt Readings from Last 3 Encounters:  02/27/21 217 lb (98.4 kg)  02/21/21 217 lb (98.4 kg)  08/23/20 217 lb 12.8 oz (98.8 kg)   Body mass  index is 31.14 kg/m.   Physical Exam    Constitutional: Appears well-developed and well-nourished. No distress.  HENT:  Head: Normocephalic and atraumatic.  Neck: Neck supple. No tracheal deviation present. No thyromegaly present.  No cervical lymphadenopathy Cardiovascular: Normal rate, regular rhythm and normal heart sounds.   No murmur heard. No carotid bruit .  No edema Pulmonary/Chest: Effort normal and breath sounds normal. No respiratory distress. No has no wheezes. No rales.  Skin: Skin is warm and dry. Not diaphoretic. Lesion on mid left ear helix and left upper chest Psychiatric: Normal mood and affect. Behavior is normal.      Assessment & Plan:    See Problem List for Assessment and Plan of chronic medical problems.    This visit occurred during the SARS-CoV-2 public health emergency.  Safety protocols were in place, including screening questions prior to the visit, additional usage of staff PPE, and extensive cleaning of exam room while observing appropriate contact time as indicated for disinfecting solutions.

## 2021-02-27 ENCOUNTER — Other Ambulatory Visit: Payer: Self-pay

## 2021-02-27 ENCOUNTER — Encounter: Payer: Self-pay | Admitting: Internal Medicine

## 2021-02-27 ENCOUNTER — Ambulatory Visit (INDEPENDENT_AMBULATORY_CARE_PROVIDER_SITE_OTHER): Payer: Medicare Other | Admitting: Internal Medicine

## 2021-02-27 VITALS — BP 130/74 | HR 81 | Temp 98.1°F | Ht 70.0 in | Wt 217.0 lb

## 2021-02-27 DIAGNOSIS — Z8673 Personal history of transient ischemic attack (TIA), and cerebral infarction without residual deficits: Secondary | ICD-10-CM

## 2021-02-27 DIAGNOSIS — I10 Essential (primary) hypertension: Secondary | ICD-10-CM | POA: Diagnosis not present

## 2021-02-27 DIAGNOSIS — R739 Hyperglycemia, unspecified: Secondary | ICD-10-CM

## 2021-02-27 DIAGNOSIS — E782 Mixed hyperlipidemia: Secondary | ICD-10-CM | POA: Diagnosis not present

## 2021-02-27 DIAGNOSIS — L989 Disorder of the skin and subcutaneous tissue, unspecified: Secondary | ICD-10-CM

## 2021-02-27 DIAGNOSIS — R413 Other amnesia: Secondary | ICD-10-CM | POA: Diagnosis not present

## 2021-02-27 DIAGNOSIS — G3184 Mild cognitive impairment, so stated: Secondary | ICD-10-CM | POA: Insufficient documentation

## 2021-02-27 DIAGNOSIS — Z1283 Encounter for screening for malignant neoplasm of skin: Secondary | ICD-10-CM

## 2021-02-27 LAB — HEMOGLOBIN A1C: Hgb A1c MFr Bld: 5.8 % (ref 4.6–6.5)

## 2021-02-27 LAB — COMPREHENSIVE METABOLIC PANEL
ALT: 27 U/L (ref 0–53)
AST: 22 U/L (ref 0–37)
Albumin: 4.3 g/dL (ref 3.5–5.2)
Alkaline Phosphatase: 53 U/L (ref 39–117)
BUN: 15 mg/dL (ref 6–23)
CO2: 28 mEq/L (ref 19–32)
Calcium: 9.2 mg/dL (ref 8.4–10.5)
Chloride: 105 mEq/L (ref 96–112)
Creatinine, Ser: 1.19 mg/dL (ref 0.40–1.50)
GFR: 58.4 mL/min — ABNORMAL LOW (ref >60.00)
Glucose, Bld: 88 mg/dL (ref 70–99)
Potassium: 4.4 mEq/L (ref 3.5–5.1)
Sodium: 140 mEq/L (ref 135–145)
Total Bilirubin: 0.9 mg/dL (ref 0.2–1.2)
Total Protein: 7.2 g/dL (ref 6.0–8.3)

## 2021-02-27 LAB — LIPID PANEL
Cholesterol: 171 mg/dL (ref 0–200)
HDL: 35 mg/dL — ABNORMAL LOW (ref >39.00)
LDL Cholesterol: 107 mg/dL — ABNORMAL HIGH (ref 0–99)
NonHDL: 135.63
Total CHOL/HDL Ratio: 5
Triglycerides: 141 mg/dL (ref 0.0–149.0)
VLDL: 28.2 mg/dL (ref 0.0–40.0)

## 2021-02-27 LAB — VITAMIN B12: Vitamin B-12: 333 pg/mL (ref 211–911)

## 2021-02-27 LAB — TSH: TSH: 2.94 u[IU]/mL (ref 0.35–5.50)

## 2021-02-27 MED ORDER — ROSUVASTATIN CALCIUM 5 MG PO TABS: 5.0000 mg | ORAL_TABLET | ORAL | 3 refills | Status: DC

## 2021-02-27 NOTE — Assessment & Plan Note (Signed)
Chronic BP well controlled Continue losartan 50 mg daily cmp  

## 2021-02-27 NOTE — Assessment & Plan Note (Signed)
Chronic Check a1c Low sugar / carb diet Stressed regular exercise  

## 2021-02-27 NOTE — Assessment & Plan Note (Signed)
Acute 1 lesion on left upper chest and 1 on left mid ear I do think he needs to see dermatology for further evaluation, especially for the lesion on his ear Referral ordered

## 2021-02-27 NOTE — Assessment & Plan Note (Signed)
Somewhat chronic There has been some memory concerns Will check TSH, B12 level Recently saw neurology Encouraged regular exercise, different brain activities, which she is doing computer games and his wife is trying to help him with his memory He is sleeping well No concerns with anxiety or depression

## 2021-02-27 NOTE — Assessment & Plan Note (Signed)
Chronic Check lipid panel  Continue Crestor 5 mg 3 times a week Regular exercise and healthy diet encouraged

## 2021-02-27 NOTE — Assessment & Plan Note (Signed)
Chronic Does have some intermittent dizziness, which could be result of the stroke BP adequately controlled Tolerating rosuvastatin 5 mg 3 times a week-has not tolerated other statins Encouraged regular exercise, healthy diet Continue aspirin 81 mg daily Check lipid panel

## 2021-02-27 NOTE — Progress Notes (Signed)
I agree with the above plan 

## 2021-02-28 ENCOUNTER — Encounter: Payer: Self-pay | Admitting: Internal Medicine

## 2021-02-28 ENCOUNTER — Other Ambulatory Visit: Payer: Self-pay | Admitting: Internal Medicine

## 2021-02-28 MED ORDER — ROSUVASTATIN CALCIUM 5 MG PO TABS: 5.0000 mg | ORAL_TABLET | ORAL | 3 refills | Status: DC

## 2021-04-18 ENCOUNTER — Ambulatory Visit (INDEPENDENT_AMBULATORY_CARE_PROVIDER_SITE_OTHER): Payer: Medicare Other

## 2021-04-18 DIAGNOSIS — Z0000 Encounter for general adult medical examination without abnormal findings: Secondary | ICD-10-CM

## 2021-04-18 NOTE — Progress Notes (Signed)
I connected with Joe Robertson today by telephone and verified that I am speaking with the correct person using two identifiers. Location patient: home Location provider: work Persons participating in the virtual visit: patient, provider.   I discussed the limitations, risks, security and privacy concerns of performing an evaluation and management service by telephone and the availability of in person appointments. I also discussed with the patient that there may be a patient responsible charge related to this service. The patient expressed understanding and verbally consented to this telephonic visit.    Interactive audio and video telecommunications were attempted between this provider and patient, however failed, due to patient having technical difficulties OR patient did not have access to video capability.  We continued and completed visit with audio only.  Some vital signs may be absent or patient reported.   Time Spent with patient on telephone encounter: 30 minutes  Subjective:   Joe Robertson is a 80 y.o. male who presents for Medicare Annual/Subsequent preventive examination.  Review of Systems     Cardiac Risk Factors include: advanced age (>73mn, >>48women);dyslipidemia;family history of premature cardiovascular disease;hypertension;male gender;obesity (BMI >30kg/m2)     Objective:    There were no vitals filed for this visit. There is no height or weight on file to calculate BMI.  Advanced Directives 04/18/2021 11/10/2019 10/30/2019 01/14/2017  Does Patient Have a Medical Advance Directive? Yes No No No  Type of Advance Directive Living will;Healthcare Power of Attorney - - -  [place]? No - copy requested - - -  Would patient like information on creating a medical advance directive? - No - Patient declined No - Patient declined Yes (MAU/Ambulatory/Procedural Areas - Information given)    Current Medications (verified) Outpatient  Encounter Medications as of 04/18/2021  Medication Sig   acetaminophen (TYLENOL) 650 MG CR tablet Take 650 mg by mouth daily as needed.   aspirin EC 81 MG EC tablet Take 1 tablet (81 mg total) by mouth daily.   losartan (COZAAR) 50 MG tablet TAKE 1 TABLET(50 MG) BY MOUTH DAILY   meloxicam (MOBIC) 15 MG tablet Take 15 mg by mouth daily.   rosuvastatin (CRESTOR) 5 MG tablet Take 1 tablet (5 mg total) by mouth 4 (four) times a week.   vitamin E 1000 UNIT capsule Take 1,000 Units by mouth daily with breakfast.   No facility-administered encounter medications on file as of 04/18/2021.    Allergies (verified) Simvastatin and Lipitor [atorvastatin]   History: Past Medical History:  Diagnosis Date   Hyperlipidemia    [place]   Past Surgical History:  Procedure Laterality Date   CHOLECYSTECTOMY     INGUINAL HERNIA REPAIR Right 01/24/2017   Procedure: LAPAROSCOPIC RIGHT INGUINAL HERNIA WITH MESH;  Surgeon: RRalene Ok MD;  [facility]  Service: General;  Laterality: Right;   INSERTION OF MESH Right 01/24/2017   Procedure: INSERTION OF MESH;  Surgeon: RRalene Ok MD;  [place] [facility]  Service: General;  Laterality: Right;   Family History  Problem Relation Age of Onset   Arthritis Mother    Heart disease Father    Arthritis Father    Diabetes Maternal Aunt    Cancer Paternal Uncle        lung   Stroke Maternal Grandmother    Diabetes Maternal Grandmother    Social History   Socioeconomic History   Marital status: Married    Spouse name: Joe Robertson  Number of children:  Not on file   Years of education: Not on file   Highest education level: Professional school degree (e.g., MD, DDS, DVM, JD)  Occupational History    Comment: retired hospital chaplain  Tobacco Use   Smoking status: Former   Smokeless tobacco: Never   Tobacco comments:    as teenager  Substance and Sexual Activity   Alcohol use: No   Drug use: No   Sexual activity: Not on file  Other  Topics Concern   Not on file  Social History Narrative   Not on file   Social Determinants of Health   Financial Resource Strain: Low Risk    Difficulty of Paying Living Expenses: Not hard at all  Food Insecurity: No Food Insecurity   Worried About Charity fundraiser in the Last Year: Never true   [place] in the Last Year: Never true  Transportation Needs: No Transportation Needs   Lack of Transportation (Medical): No   Lack of Transportation (Non-Medical): No  Physical Activity: Sufficiently Active   Days of Exercise per Week: 5 days   Minutes of Exercise per Session: 30 min  Stress: No Stress Concern Present   Feeling of Stress : Not at all  Social Connections: Socially Integrated   Frequency of Communication with Friends and Family: More than three times a week   Frequency of Social Gatherings with Friends and Family: More than three times a week   Attends Religious Services: More than 4 times per year   Active Member of [employer] or Organizations: Yes   Attends Music therapist: More than 4 times per year   Marital Status: Married    Tobacco Counseling Counseling given: Not Answered Tobacco comments: as teenager   Clinical Intake:  Pre-visit preparation completed: Yes  Pain : No/denies pain     Nutritional Risks: None Diabetes: No  How often do you need to have someone help you when you read instructions, pamphlets, or other written materials from your doctor or pharmacy?: 1 - Never What is the last grade level you completed in school?: Doctorate Degree from [employer]; 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 no  Interpreter Needed?: No  Information entered by :: Lisette Abu, LPN   Activities of Daily Living In your present state of health, do you have any difficulty performing the following activities: 04/18/2021 02/27/2021  Hearing? N Y  Vision? N N  Difficulty concentrating or making decisions? N N  Walking or climbing stairs?  N N  Dressing or bathing? N N  Doing errands, shopping? N N  Preparing Food and eating ? N -  Using the Toilet? N -  In the past six months, have you accidently leaked urine? N -  Do you have problems with loss of bowel control? N -  Managing your Medications? N -  Managing your Finances? N -  Housekeeping or managing your Housekeeping? N -  Some recent data might be hidden    Patient Care Team: Binnie Rail, MD as PCP - General (Internal Medicine) Alanda Slim, Neena Rhymes, MD as Consulting Physician (Ophthalmology)  Indicate any recent Medical Services you may have received from other than [facility] providers in the past year (date may be approximate).     Assessment:   This is a routine wellness examination for Joe Robertson.  Hearing/Vision screen Hearing Screening - Comments:: Patient denied any hearing difficulty. Vision Screening - Comments:: Patient wears glasses.  Annual eye exam done by Dr. Julian Reil with  [facility].  Dietary issues and exercise activities discussed: Current Exercise Habits: Home exercise routine, Type of exercise: walking (walks 2-3 miles everyday), Time (Minutes): 30, Frequency (Times/Week): 7, Weekly Exercise (Minutes/Week): 210, Intensity: Moderate, Exercise limited by: orthopedic condition(s)   Goals Addressed   None   Depression Screen PHQ 2/9 Scores 04/18/2021 02/21/2021 12/02/2019 02/09/2019 02/02/2018 07/03/2016  PHQ - 2 Score 0 0 0 0 0 0    Fall Risk Fall Risk  04/18/2021 02/27/2021 02/16/2020 02/09/2019 02/02/2018  Falls in the past year? 0 0 1 1 No  Number falls in past yr: 0 0 0 1 -  Injury with Fall? 0 0 1 0 -  Risk for fall due to : Impaired balance/gait No Fall Risks No Fall Risks - -  Follow up Falls evaluation completed Falls evaluation completed Falls evaluation completed - -    FALL RISK PREVENTION PERTAINING TO THE HOME:  Any stairs in or around the home? Yes  If so, are there any without handrails? No  Home free of loose throw  rugs in walkways, pet beds, electrical cords, etc? Yes  Adequate lighting in your home to reduce risk of falls? Yes   ASSISTIVE DEVICES UTILIZED TO PREVENT FALLS:  Life alert? No  Use of a cane, walker or w/c? Yes  Grab bars in the bathroom? Yes  Shower chair or bench in shower? Yes  Elevated toilet seat or a handicapped toilet? Yes   TIMED UP AND GO:  Was the test performed? No .  Length of time to ambulate 10 feet: n/a sec.   Gait steady and fast with assistive device  Cognitive Function: No flowsheet data found.         Immunizations Immunization History  Administered Date(s) Administered   Fluad Quad(high Dose 65+) 04/12/2019, 06/01/2020   Influenza Split 06/13/2014   Influenza, High Dose Seasonal PF 05/21/2015, 05/20/2017, 05/18/2018   Influenza-Unspecified 06/02/2016   Moderna Sars-Covid-2 Vaccination 10/15/2019, 11/12/2019, 08/24/2020   Pneumococcal Conjugate-13 05/20/2015   Pneumococcal Polysaccharide-23 07/03/2016   Tdap 05/18/2018    TDAP status: Up to date  Flu Vaccine status: Up to date  Pneumococcal vaccine status: Up to date  Covid-19 vaccine status: Completed vaccines  Qualifies for Shingles Vaccine? Yes   Zostavax completed No   Shingrix Completed?: No.    Education has been provided regarding the importance of this vaccine. Patient has been advised to call insurance company to determine out of pocket expense if they have not yet received this vaccine. Advised may also receive vaccine at local pharmacy or Health Dept. Verbalized acceptance and understanding.  Screening Tests Health Maintenance  Topic Date Due   Zoster Vaccines- Shingrix (1 of 2) Never done   COVID-19 Vaccine (4 - Booster for Moderna series) 12/22/2020   INFLUENZA VACCINE  03/19/2021   Hepatitis C Screening  08/21/2053 (Originally 05/26/1959)   TETANUS/TDAP  05/18/2028   PNA vac Low Risk Adult  Completed   HPV VACCINES  Aged Out    Health Maintenance  Health Maintenance Due   Topic Date Due   Zoster Vaccines- Shingrix (1 of 2) Never done   COVID-19 Vaccine (4 - Booster for Moderna series) 12/22/2020   INFLUENZA VACCINE  03/19/2021    Colorectal cancer screening: No longer required.   Lung Cancer Screening: (Low Dose CT Chest recommended if Age 71-80 years, 30 pack-year currently smoking OR have quit w/in 15years.) does not qualify.   Lung Cancer Screening Referral: no  Additional Screening:  Hepatitis C  Screening: does qualify; Completed yes  Vision Screening: Recommended annual ophthalmology exams for early detection of glaucoma and other disorders of the eye. Is the patient up to date with their annual eye exam?  Yes  Who is the provider or what is the name of the office in which the patient attends annual eye exams? Julian Reil, MD. If pt is not established with a provider, would they like to be referred to a provider to establish care? No .   Dental Screening: Recommended annual dental exams for proper oral hygiene  Community Resource Referral / Chronic Care Management: CRR required this visit?  No   CCM required this visit?  No      Plan:     I have personally reviewed and noted the following in the patient's chart:   Medical and social history Use of alcohol, tobacco or illicit drugs  Current medications and supplements including opioid prescriptions. Patient is not currently taking opioid prescriptions. Functional ability and status Nutritional status Physical activity Advanced directives List of other physicians Hospitalizations, surgeries, and ER visits in previous 12 months Vitals Screenings to include cognitive, depression, and falls Referrals and appointments  In addition, I have reviewed and discussed with patient certain preventive protocols, quality metrics, and best practice recommendations. A written personalized care plan for preventive services as well as general preventive health recommendations were provided to  patient.     Sheral Flow, LPN   [id number]   Nurse Notes:  Hearing Screening - Comments:: Patient denied any hearing difficulty. Vision Screening - Comments:: Patient wears glasses.  Annual [facility] exam done [facility]. Patient is cogitatively intact. There were no vitals filed for this visit. There is no height or weight on file to calculate BMI.

## 2021-04-18 NOTE — Patient Instructions (Signed)
Joe Robertson , Thank you for taking time to come for your Medicare Wellness Visit. I appreciate your ongoing commitment to your health goals. Please review the following plan we discussed and let me know if I can assist you in the future.   Screening recommendations/referrals: Colonoscopy: not a candidate for screening due to age Recommended yearly ophthalmology/optometry visit for glaucoma screening and checkup Recommended yearly dental visit for hygiene and checkup  Vaccinations: Influenza vaccine: 06/01/2020 Pneumococcal vaccine: 05/20/2015, 07/03/2016 Tdap vaccine: 05/18/2018; due every 10 years Shingles vaccine: never done; can check with local pharmacy for vaccine.   Covid-19: 10/15/2019, 11/12/2019, 08/24/2020  Advanced directives: Please bring a copy of your health care power of attorney and living will to the office at your convenience.  Conditions/risks identified: Yes; Client understands the importance of follow-up with providers by attending scheduled visits and discussed goals to eat healthier, increase physical activity, exercise the brain, socialize more, get enough sleep and make time for laughter.  Next appointment: Please schedule your next Medicare Wellness Visit with your Nurse Health Advisor in 1 year by calling [phone].  Preventive Care 29 Years and Older, Male Preventive care refers to lifestyle choices and visits with your health care provider that can promote health and wellness. What does preventive care include? A yearly physical exam. This is also called an annual well check. Dental exams once or twice a year. Routine eye exams. Ask your health care provider how often you should have your eyes checked. Personal lifestyle choices, including: Daily care of your teeth and gums. Regular physical activity. Eating a healthy diet. Avoiding tobacco and drug use. Limiting alcohol use. Practicing safe sex. Taking low doses of aspirin every day. Taking vitamin and  mineral supplements as recommended by your health care provider. What happens during an annual well check? The services and screenings done by your health care provider during your annual well check will depend on your age, overall health, lifestyle risk factors, and family history of disease. Counseling  Your health care provider may ask you questions about your: Alcohol use. Tobacco use. Drug use. Emotional well-being. Home and relationship well-being. Sexual activity. Eating habits. History of falls. Memory and ability to understand (cognition). Work and work Statistician. Screening  You may have the following tests or measurements: Height, weight, and BMI. Blood pressure. Lipid and cholesterol levels. These may be checked every 5 years, or more frequently if you are over 22 years old. Skin check. Lung cancer screening. You may have this screening every year starting at age 12 if you have a 30-pack-year history of smoking and currently smoke or have quit within the past 15 years. Fecal occult blood test (FOBT) of the stool. You may have this test every year starting at age 29. Flexible sigmoidoscopy or colonoscopy. You may have a sigmoidoscopy every 5 years or a colonoscopy every 10 years starting at age 52. Prostate cancer screening. Recommendations will vary depending on your family history and other risks. Hepatitis C blood test. Hepatitis B blood test. Sexually transmitted disease (STD) testing. Diabetes screening. This is done by checking your blood sugar (glucose) after you have not eaten for a while (fasting). You may have this done every 1-3 years. Abdominal aortic aneurysm (AAA) screening. You may need this if you are a current or former smoker. Osteoporosis. You may be screened starting at age 64 if you are at high risk. Talk with your health care provider about your test results, treatment options, and if necessary, the need for  more tests. Vaccines  Your health care  provider may recommend certain vaccines, such as: Influenza vaccine. This is recommended every year. Tetanus, diphtheria, and acellular pertussis (Tdap, Td) vaccine. You may need a Td booster every 10 years. Zoster vaccine. You may need this after age 62. Pneumococcal 13-valent conjugate (PCV13) vaccine. One dose is recommended after age 56. Pneumococcal polysaccharide (PPSV23) vaccine. One dose is recommended after age 44. Talk to your health care provider about which screenings and vaccines you need and how often you need them. This information is not intended to replace advice given to you by your health care provider. Make sure you discuss any questions you have with your health care provider. Document Released: 09/01/2015 Document Revised: 04/24/2016 Document Reviewed: 06/06/2015 Elsevier Interactive Patient Education  2017 [employer] Prevention in the Home Falls can cause injuries. They can happen to people of all ages. There are many things you can do to make your home safe and to help prevent falls. What can I do on the outside of my home? Regularly fix the edges of walkways and driveways and fix any cracks. Remove anything that might make you trip as you walk through a door, such as a raised step or threshold. Trim any bushes or trees on the path to your home. Use bright outdoor lighting. Clear any walking paths of anything that might make someone trip, such as rocks or tools. Regularly check to see if handrails are loose or broken. Make sure that both sides of any steps have handrails. Any raised decks and porches should have guardrails on the edges. Have any leaves, snow, or ice cleared regularly. Use sand or salt on walking paths during winter. Clean up any spills in your garage right away. This includes oil or grease spills. What can I do in the bathroom? Use night lights. Install grab bars by the toilet and in the tub and shower. Do not use towel bars as grab  bars. Use non-skid mats or decals in the tub or shower. If you need to sit down in the shower, use a plastic, non-slip stool. Keep the floor dry. Clean up any water that spills on the floor as soon as it happens. Remove soap buildup in the tub or shower regularly. Attach bath mats securely with double-sided non-slip rug tape. Do not have throw rugs and other things on the floor that can make you trip. What can I do in the bedroom? Use night lights. Make sure that you have a light by your bed that is easy to reach. Do not use any sheets or blankets that are too big for your bed. They should not hang down onto the floor. Have a firm chair that has side arms. You can use this for support while you get dressed. Do not have throw rugs and other things on the floor that can make you trip. What can I do in the kitchen? Clean up any spills right away. Avoid walking on wet floors. Keep items that you use a lot in easy-to-reach places. If you need to reach something above you, use a strong step stool that has a grab bar. Keep electrical cords out of the way. Do not use floor polish or wax that makes floors slippery. If you must use wax, use non-skid floor wax. Do not have throw rugs and other things on the floor that can make you trip. What can I do with my stairs? Do not leave any items on the stairs. Make  sure that there are handrails on both sides of the stairs and use them. Fix handrails that are broken or loose. Make sure that handrails are as long as the stairways. Check any carpeting to make sure that it is firmly attached to the stairs. Fix any carpet that is loose or worn. Avoid having throw rugs at the top or bottom of the stairs. If you do have throw rugs, attach them to the floor with carpet tape. Make sure that you have a light switch at the top of the stairs and the bottom of the stairs. If you do not have them, ask someone to add them for you. What else can I do to help prevent  falls? Wear shoes that: Do not have high heels. Have rubber bottoms. Are comfortable and fit you well. Are closed at the toe. Do not wear sandals. If you use a stepladder: Make sure that it is fully opened. Do not climb a closed stepladder. Make sure that both sides of the stepladder are locked into place. Ask someone to hold it for you, if possible. Clearly mark and make sure that you can see: Any grab bars or handrails. First and last steps. Where the edge of each step is. Use tools that help you move around (mobility aids) if they are needed. These include: Canes. Walkers. Scooters. Crutches. Turn on the lights when you go into a dark area. Replace any light bulbs as soon as they burn out. Set up your furniture so you have a clear path. Avoid moving your furniture around. If any of your floors are uneven, fix them. If there are any pets around you, be aware of where they are. Review your medicines with your doctor. Some medicines can make you feel dizzy. This can increase your chance of falling. Ask your doctor what other things that you can do to help prevent falls. This information is not intended to replace advice given to you by your health care provider. Make sure you discuss any questions you have with your health care provider. Document Released: 06/01/2009 Document Revised: 01/11/2016 Document Reviewed: 09/09/2014 Elsevier Interactive Patient Education  2017 [employer].

## 2021-04-26 ENCOUNTER — Other Ambulatory Visit: Payer: Self-pay

## 2021-04-26 ENCOUNTER — Other Ambulatory Visit: Payer: Self-pay | Admitting: Internal Medicine

## 2021-04-26 MED ORDER — ROSUVASTATIN CALCIUM 5 MG PO TABS: 5.0000 mg | ORAL_TABLET | ORAL | 3 refills | Status: DC

## 2021-05-14 ENCOUNTER — Other Ambulatory Visit: Payer: Self-pay

## 2021-05-14 ENCOUNTER — Ambulatory Visit (INDEPENDENT_AMBULATORY_CARE_PROVIDER_SITE_OTHER): Payer: Medicare Other

## 2021-05-14 DIAGNOSIS — Z23 Encounter for immunization: Secondary | ICD-10-CM | POA: Diagnosis not present

## 2021-06-11 DIAGNOSIS — H25813 Combined forms of age-related cataract, bilateral: Secondary | ICD-10-CM | POA: Diagnosis not present

## 2021-06-11 DIAGNOSIS — S0511XA Contusion of eyeball and orbital tissues, right eye, initial encounter: Secondary | ICD-10-CM | POA: Diagnosis not present

## 2021-06-11 DIAGNOSIS — H524 Presbyopia: Secondary | ICD-10-CM | POA: Diagnosis not present

## 2021-06-12 DIAGNOSIS — H524 Presbyopia: Secondary | ICD-10-CM | POA: Diagnosis not present

## 2021-06-20 ENCOUNTER — Encounter: Payer: Self-pay | Admitting: Internal Medicine

## 2021-06-21 ENCOUNTER — Encounter: Payer: Self-pay | Admitting: Adult Health

## 2021-08-29 ENCOUNTER — Encounter: Payer: Self-pay | Admitting: Adult Health

## 2021-08-29 ENCOUNTER — Ambulatory Visit: Payer: Medicare Other | Admitting: Adult Health

## 2021-08-29 VITALS — BP 156/88 | HR 84 | Ht 70.0 in | Wt 223.0 lb

## 2021-08-29 DIAGNOSIS — I6381 Other cerebral infarction due to occlusion or stenosis of small artery: Secondary | ICD-10-CM

## 2021-08-29 DIAGNOSIS — R55 Syncope and collapse: Secondary | ICD-10-CM

## 2021-08-29 NOTE — Patient Instructions (Signed)
Continue aspirin 81 mg daily  and Crestor for secondary stroke prevention  Continue to follow up with PCP regarding cholesterol and blood pressure management  Maintain strict control of hypertension with blood pressure goal below 130/90 and cholesterol with LDL cholesterol (bad cholesterol) goal below 70 mg/dL.   Signs of a Stroke? Follow the BEFAST method:  Balance Watch for a sudden loss of balance, trouble with coordination or vertigo Eyes Is there a sudden loss of vision in one or both eyes? Or double vision?  Face: Ask the person to smile. Does one side of the face droop or is it numb?  Arms: Ask the person to raise both arms. Does one arm drift downward? Is there weakness or numbness of a leg? Speech: Ask the person to repeat a simple phrase. Does the speech sound slurred/strange? Is the person confused ? Time: If you observe any of these signs, call 911.  Follow up with Dr. Quay Burow regarding continued dizziness and possible need of cardiac evaluation - would recommend checking blood pressure when feeling dizzy. Ensure you increase your water intake !      Followup in the future with me in 6 months or call earlier if needed      Thank you for coming to see [place] at [facility]. I hope we have been able to provide you high quality care today.  You may receive a patient satisfaction survey over the next few weeks. We would appreciate your feedback and comments so that we may continue to improve ourselves and the health of our patients.

## 2021-08-29 NOTE — Progress Notes (Signed)
[facility] [address] [place]. [place] [address] [phone]       STROKE FOLLOW UP NOTE  Mr. Merdith Boyd Date of Birth:  12-Sep-1940 Medical Record Number:  [id number]   Reason for Referral: stroke follow up    SUBJECTIVE:   CHIEF COMPLAINT:  Chief Complaint  Patient presents with   Follow-up    Rm 2 with spouse brenda   Pt is well and stable, no new concerns      HPI:   Update 08/29/2021 JM: returns for 6 mo stroke f/u accompanied by his wife.  Overall stable.  Denies new stroke/TIA symptoms. C/o continued intermittent dizziness - did have a fall in November - states he started to feel very dizzy and then "blacked out" and lose consciousness that he believes only lasted for a few seconds. Once came to, denies confusion or any type of seizure activity. Dizziness typically upon getting up in the morning and randomly during the day. Reports present since stroke but first reported this at visit in 03/2020 with similar presyncope/syncopal episode. Recommend eval by cardiology but never pursued this - no additional syncopal events until recently. Does not check blood pressure with dizziness although checks daily and stable. Admits to limited water intake. Denies tinnitus or hearing loss. Denies dizziness/vertigo symptoms with quick head position changes. Dizziness is not debilitating - able to ambulate with use of cane. Cognition stable since prior visit.  Compliant on aspirin and Crestor -denies side effects.  Blood pressure today 156/88. Has f/u with PCP scheduled this Friday. No further concerns at this time.      History provided for reference purposes only Update 02/21/2021 JM: Mr. Temme returns for 18-month stroke follow-up accompanied by his wife, Hassan Rowan.  Stable since prior visit without new stroke/TIA symptoms.  He does report continued intermittent dizziness which has been present since his stroke as well as short-term memory loss which can fluctuate  but denies specific worsening.  He continues to ambulate with a cane and denies any recent falls.  He stays active during the day and routinely does memory exercises.  He reports he sleeps well at night and has a good appetite. Reports compliance on aspirin and Crestor 5 mg three times weekly without associated side effects.  LDL 53 down from 124 on 10/11/2020.  Blood pressure today 139/70.  No further concerns at this time.  Update 08/23/2020 JM: Mr. Micke returns for 65-month stroke follow-up accompanied by his wife.  He has been doing very well since prior visit with resolution of prior complaints including dizziness, insomnia and cognitive concerns.  He has unfortunately had a couple falls the first week of December.  Both of them in setting of slipping on a slick surface and unfortunately resulted in left hand fracture but otherwise no other injuries.  Denies falls occurring due to dizziness or lack of balance.  Denies new stroke/TIA symptoms.  Remains on aspirin and pravastatin 40 mg daily for secondary stroke prevention without side effects.  Lipid panel obtained by PCP 08/21/2020 with LDL 124 therefore Dr. Quay Burow plans on trialing Crestor as he has had difficulty tolerating high-dose statins.  Blood pressure today 134/74. Monitors at home which has been stable and similar to todays reading.  No concerns at this time.  Update 04/18/2020 JM: Mr. 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  He also feels at times he has difficulty laying flat or will wake up gasping for air He has been experiencing dizziness since his stroke - unable to determine if he is experiencing a true dizziness sensation vs vertigo vs imbalance as he will start speaking of a different topic He does report 3 episodes of vision going black  (aware of his surroundings), foggy headedness and dizziness resulting in a fall.  He did lose consciousness with 2 of those episodes.  Prior episode occurred on 6/16.  Denies dyspnea, heart palpitation or racing heart type sensation. Apparently, he has spoke to his PCP regarding above concerns but she feels as though more anxiety related He is concerned as there may be something else going on or possibly medication related He has been experiencing greater right hip pain recently receiving injection by orthopedics with benefit Currently using a cane for ambulation Remains on aspirin and pravastatin 40 mg daily for secondary stroke prevention without side effects Blood pressure today 142/76 Follows closely with PCP for HTN and HLD management No further concerns at this time  Update 12/02/2019 JM:, Mr. Riemenschneider is being seen for hospital follow-up. He has been doing well from a stroke standpoint with residual mild cognitive impairment but denies weakness or numbness/tingling. 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 He has completed 3 weeks DAPT and continues on aspirin alone without bleeding or bruising.  Continues on pravastatin 40 mg daily tolerating well without side effects.  Blood pressure today  127/73 with similar home readings. PCP recently initiated losartan 50 mg daily for elevated BP and new diagnosis of HTN.  Also reported difficulty sleeping with increased anxiety and PCP initiated trazodone 50 mg nightly and recently increased to 100 mg nightly.  No further concerns at this time.  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  Evaluated by stroke team and Dr. 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  Recommended DAPT for 3  weeks and aspirin alone.  LDL 151 and history of statin intolerance with Zocor and Lipitor and patient agreeable to trial of pravastatin 40 mg daily.  No history of HTN or DM.  No prior history of stroke.  Evaluated by therapies and recommended home health OT and SLP and was discharged home in stable condition on 11/02/2019.  Stroke:  R thalamic infarct s/p IV tPA, secondary to small vessel disease Code Stroke CT Head - No acute intracranial abnormality.  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  MRI head - R thalamic infarct  2D Echo - EF 60-65%. No source of embolus  [employer] Virus 2 - negative LDL - 151 -initiate pravastatin 40 mg daily -history of statin intolerance to simvastatin and atorvastatin HgbA1c - 5.4 No antithrombotic prior to admission, now on aspirin 81 mg and plavix 75 mg daily x 3 weeks then aspirin alone.  Therapy recommendations:  No PT, OP OT, OP SLP -> HH OT and SLP arranged Disposition:  d/c home      ROS:   14 system review of systems performed and negative with exception of see HPI  PMH:  Past Medical History:  Diagnosis Date   Hyperlipidemia    Stroke [facility]) 10/2019    PSH:  Past Surgical History:  Procedure Laterality Date   CHOLECYSTECTOMY     INGUINAL HERNIA REPAIR Right 01/24/2017   Procedure: LAPAROSCOPIC RIGHT INGUINAL HERNIA WITH MESH;  Surgeon: Ralene Ok, MD;  Location: [place];  Service: General;  Laterality: Right;   INSERTION OF MESH Right 01/24/2017   Procedure: INSERTION OF MESH;  Surgeon: Ralene Ok, MD;  Location: [place];  Service: General;  Laterality: Right;    Social History:  Social History   Socioeconomic History   Marital status: Married    Spouse name: Romie Minus   Number of children: Not on file   Years of education: Not on file   Highest education level: Professional school degree (e.g., MD, DDS,  DVM, JD)  Occupational History    Comment: retired Geophysicist/field seismologist  Tobacco Use   Smoking status: Former   Smokeless tobacco: Never   Tobacco comments:    as teenager  Substance and Sexual Activity   Alcohol use: No   Drug use: No   Sexual activity: Not on file  Other Topics Concern   Not on file  Social History Narrative   Not on file   Social Determinants of Health   Financial Resource Strain: Low Risk    Difficulty of Paying Living Expenses: Not hard at all  Food Insecurity: No Food Insecurity   Worried About Charity fundraiser in the Last Year: Never true   [place] in the Last Year: Never true  Transportation Needs: No Transportation Needs   Lack of Transportation (Medical): No   Lack of Transportation (Non-Medical): No  Physical Activity: Sufficiently Active   Days of Exercise per Week: 5 days   Minutes of Exercise per Session: 30 min  Stress: No Stress Concern Present   Feeling of Stress : Not at all  Social Connections: Socially Integrated   Frequency of Communication with Friends and Family: More than three times a week   Frequency of Social Gatherings with Friends and Family: More than three times a week   Attends Religious Services: More than 4 times per year   Active Member of [employer] or Organizations: Yes   Attends Music therapist: More than 4 times per year   Marital Status: Married  Human resources officer Violence: Not At Risk   Fear of Current or Ex-Partner: No   Emotionally Abused: No   Physically Abused: No   Sexually Abused: No    Family History:  Family History  Problem Relation Age of Onset   Arthritis Mother    Heart disease Father    Arthritis Father    Diabetes Maternal Aunt    Cancer Paternal Uncle        lung   Stroke Maternal Grandmother    Diabetes Maternal Grandmother     Medications:   Current Outpatient Medications on File Prior to Visit  Medication Sig Dispense Refill   acetaminophen (TYLENOL) 650 MG CR  tablet Take 650 mg by mouth daily as needed.     aspirin EC 81 MG EC tablet Take 1 tablet (81 mg total) by mouth daily.     losartan (COZAAR) 50 MG tablet TAKE 1 TABLET(50 MG) BY MOUTH DAILY 90 tablet 1   meloxicam (MOBIC) 15 MG tablet Take 15 mg by mouth daily.     rosuvastatin (CRESTOR) 5 MG tablet Take 1 tablet (5 mg total) by mouth 4 (four) times a  week. 48 tablet 3   vitamin E 1000 UNIT capsule Take 1,000 Units by mouth daily with breakfast.     No current facility-administered medications on file prior to visit.    Allergies:   Allergies  Allergen Reactions   Simvastatin Other (See Comments)    Muscle pain   Lipitor [Atorvastatin] Other (See Comments)    Muscle aches      OBJECTIVE:  Vitals  Vitals:   08/29/21 1022  BP: (!) 156/88  Pulse: 84  Weight: 223 lb (101.2 kg)  Height: 5\' 10"  (1.778 m)     Body mass index is 32 kg/m. No results found.   Physical exam  General: well developed, well nourished, very pleasant elderly Caucasian male, seated, in no evident distress Head: head normocephalic and atraumatic.   Neck: supple with no carotid or supraclavicular bruits Cardiovascular: regular rate and rhythm, no murmurs Musculoskeletal: no deformity Skin:  no rash/petichiae Vascular:  Normal pulses all extremities   Neurologic Exam Mental Status: Awake and fully alert.  Fluent speech and language. Oriented to place and time. Recent memory impaired and remote memory appears intact. Attention span, concentration and fund of knowledge appropriate during visit. Mood and affect appropriate. Recall: 0/3 - with prompt 2/3. 4 legged Animal naming 6 (prior 8) in 60 seconds. Serial addition good.  Cranial Nerves: Pupils equal, briskly reactive to light. Extraocular movements full without nystagmus. Visual fields full to confrontation. Hearing intact. Facial sensation intact. Face, tongue, palate moves normally and symmetrically.  Motor: Normal bulk and tone. Normal strength  in all tested extremity muscles. Sensory.: intact to touch , pinprick , position and vibratory sensation.  Coordination: Rapid alternating movements normal in all extremities. Finger-to-nose and heel-to-shin performed accurately bilaterally. Gait and Station: Arises from chair without difficulty. Stance is normal. Gait demonstrates  normal stride length and balance without use of assistive device  Reflexes: 1+ and symmetric. Toes downgoing.       ASSESSMENT: Joe Robertson is a 81 y.o. 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     PLAN:  Right thalamic stroke:  Continue aspirin 81 mg daily  and Crestor for secondary stroke prevention.  Discussed secondary stroke prevention measures and importance of close PCP follow-up for aggressive stroke risk factor management Pre-syncope/syncope dizziness:  reports dizziness present since stroke but not typical deficit from thalamic stroke. Questionable blood pressure/cardiac related although denies other specific cardiac symptoms. Recommended evaluation by cardiology - he wishes to defer to further discuss with PCP. Discussed checking blood pressure when feeling dizzy and increase fluid intake HLD:  LDL goal<70.  LDL 107 (02/2021) up from 53 on Crestor 5mg  TID now taking QID. Intolerance to high-dose statins. Plans on repeat levels with PCP Friday HTN:  BP goal<130/90.  Slightly elevated today - stable at home on losartan per PCP    Follow up in 6 months or call earlier if needed   CC:  Burns, Claudina Lick, MD     I spent 36 minutes of face-to-face and non-face-to-face time with patient and wife.  This included previsit chart review, lab review, study  review, electronic health record documentation, patient and wife education and discussion regarding prior stroke as well as secondary stroke prevention measures and aggressive stroke risk factor management, intermittent  dizziness and syncope and memory concerns and answered all other questions to patient and wife satisfaction  Frann Rider, AGNP-BC  [facility] [address] [place], [place] [address]  Phone [phone] Fax [phone] Note: This document was prepared with digital dictation and possible smart phrase technology. Any transcriptional errors that result from this process are unintentional.

## 2021-08-30 ENCOUNTER — Ambulatory Visit: Payer: Medicare Other | Admitting: Internal Medicine

## 2021-08-30 NOTE — Patient Instructions (Addendum)
° ° °  Blood work was ordered.     Medications changes include :   trazodone for sleep 50 mg  - if this does not work try 2 pill or 100 mg at night  Your prescription(s) have been submitted to your pharmacy. Please take as directed and contact our office if you believe you are having problem(s) with the medication(s).   A referral was ordered for Cardiology.      Someone from their office will call you to schedule an appointment.    Please followup in 6 months

## 2021-08-30 NOTE — Progress Notes (Signed)
Subjective:    Patient ID: Joe Robertson, male    DOB: [birth year], 81 y.o.   MRN: [id number]  This visit occurred during the SARS-CoV-2 public health emergency.  Safety protocols were in place, including screening questions prior to the visit, additional usage of staff PPE, and extensive cleaning of exam room while observing appropriate contact time as indicated for disinfecting solutions.     HPI The patient is here for follow up of their chronic medical problems, including htn, hld, h/o CVA, hyperglycemia, OA  He has "quick blackouts" -has associated lightheaded, but no other symptoms.  These episodes last a few seconds.  His eyes are open and he does answer his wife.  They are not able to say if there is any pattern to when they occur.  He feels this is related to his stroke and what he thinks were mini strokes that he may have had following stroke.  There has been 6-8 episodes since his stroke.  He had 3 prior to the CVA.    BP at home 140's/70's at home.  Not sleeping great, which is not new.  His wife has been giving him Tylenol PM or an over-the-counter sleep aid.  That is not working great and melatonin in the past does not work.  They wonder what else he can take.  Medications and allergies reviewed with patient and updated if appropriate.  Patient Active Problem List   Diagnosis Date Noted   Difficulty sleeping 08/31/2021   Memory difficulties 02/27/2021   Hyperglycemia 08/18/2020   Hypertension 11/16/2019   Obesity 11/02/2019   Family hx-stroke 11/02/2019   History of stroke, right thalamic, s/p tPA, 10/2019 10/30/2019   Localized osteoarthrosis of right hip, mod-sev 07/22/2019   Digital mucinous cyst of finger 02/09/2019   Skin abnormalities 06/17/2018   Chronic pain of right knee 06/17/2018   Wears hearing aid 07/04/2016   History of nephrolithiasis 07/03/2016   Hyperlipidemia 06/29/2016   Primary osteoarthritis 06/29/2016   Vitamin D deficiency 06/29/2016    BPH (benign prostatic hyperplasia) 06/29/2016    Current Outpatient Medications on File Prior to Visit  Medication Sig Dispense Refill   acetaminophen (TYLENOL) 650 MG CR tablet Take 650 mg by mouth daily as needed.     aspirin EC 81 MG EC tablet Take 1 tablet (81 mg total) by mouth daily.     losartan (COZAAR) 50 MG tablet TAKE 1 TABLET(50 MG) BY MOUTH DAILY 90 tablet 1   meloxicam (MOBIC) 15 MG tablet Take 15 mg by mouth daily.     rosuvastatin (CRESTOR) 5 MG tablet Take 1 tablet (5 mg total) by mouth 4 (four) times a week. 48 tablet 3   vitamin E 1000 UNIT capsule Take 1,000 Units by mouth daily with breakfast.     No current facility-administered medications on file prior to visit.    Past Medical History:  Diagnosis Date   Hyperlipidemia    Stroke [facility]) 10/2019    Past Surgical History:  Procedure Laterality Date   CHOLECYSTECTOMY     INGUINAL HERNIA REPAIR Right 01/24/2017   Procedure: LAPAROSCOPIC RIGHT INGUINAL HERNIA WITH MESH;  Surgeon: Ralene Ok, MD;  Location: [place];  Service: General;  Laterality: Right;   INSERTION OF MESH Right 01/24/2017   Procedure: INSERTION OF MESH;  Surgeon: Ralene Ok, MD;  Location: [place];  Service: General;  Laterality: Right;    Social History   Socioeconomic History   Marital status: Married  Spouse name: Romie Minus   Number of children: Not on file   Years of education: Not on file   Highest education level: Professional school degree (e.g., MD, DDS, DVM, JD)  Occupational History    Comment: retired hospital chaplain  Tobacco Use   Smoking status: Former   Smokeless tobacco: Never   Tobacco comments:    as teenager  Substance and Sexual Activity   Alcohol use: No   Drug use: No   Sexual activity: Not on file  Other Topics Concern   Not on file  Social History Narrative   Not on file   Social Determinants of Health   Financial Resource Strain: Low Risk    Difficulty of Paying Living Expenses: Not hard at all   Food Insecurity: No Food Insecurity   Worried About Charity fundraiser in the Last Year: Never true   [place] in the Last Year: Never true  Transportation Needs: No Transportation Needs   Lack of Transportation (Medical): No   Lack of Transportation (Non-Medical): No  Physical Activity: Sufficiently Active   Days of Exercise per Week: 5 days   Minutes of Exercise per Session: 30 min  Stress: No Stress Concern Present   Feeling of Stress : Not at all  Social Connections: Socially Integrated   Frequency of Communication with Friends and Family: More than three times a week   Frequency of Social Gatherings with Friends and Family: More than three times a week   Attends Religious Services: More than 4 times per year   Active Member of [employer] or Organizations: Yes   Attends Music therapist: More than 4 times per year   Marital Status: Married    Family History  Problem Relation Age of Onset   Arthritis Mother    Heart disease Father    Arthritis Father    Diabetes Maternal Aunt    Cancer Paternal Uncle        lung   Stroke Maternal Grandmother    Diabetes Maternal Grandmother     Review of Systems  Constitutional:  Negative for fever.  Respiratory:  Negative for cough, shortness of breath and wheezing.   Cardiovascular:  Negative for chest pain, palpitations and leg swelling.  Neurological:  Negative for headaches.      Objective:   Vitals:   08/31/21 0916  BP: 140/76  Pulse: 84  Temp: 97.6 F (36.4 C)  SpO2: 96%   BP Readings from Last 3 Encounters:  08/31/21 140/76  08/29/21 (!) 156/88  02/27/21 130/74   Wt Readings from Last 3 Encounters:  08/31/21 222 lb (100.7 kg)  08/29/21 223 lb (101.2 kg)  02/27/21 217 lb (98.4 kg)   Body mass index is 31.85 kg/m.   Physical Exam    Constitutional: Appears well-developed and well-nourished. No distress.  HENT:  Head: Normocephalic and atraumatic.  Neck: Neck supple. No tracheal deviation  present. No thyromegaly present.  No cervical lymphadenopathy Cardiovascular: Normal rate, regular rhythm and normal heart sounds.   No murmur heard. No carotid bruit .  No edema Pulmonary/Chest: Effort normal and breath sounds normal. No respiratory distress. No has no wheezes. No rales.  Skin: Skin is warm and dry. Not diaphoretic.  Psychiatric: Normal mood and affect. Behavior is normal.      Assessment & Plan:    See Problem List for Assessment and Plan of chronic medical problems.

## 2021-08-31 ENCOUNTER — Encounter: Payer: Self-pay | Admitting: Internal Medicine

## 2021-08-31 ENCOUNTER — Other Ambulatory Visit: Payer: Self-pay

## 2021-08-31 ENCOUNTER — Ambulatory Visit (INDEPENDENT_AMBULATORY_CARE_PROVIDER_SITE_OTHER): Payer: Medicare Other | Admitting: Internal Medicine

## 2021-08-31 VITALS — BP 140/76 | HR 84 | Temp 97.6°F | Ht 70.0 in | Wt 222.0 lb

## 2021-08-31 DIAGNOSIS — R55 Syncope and collapse: Secondary | ICD-10-CM

## 2021-08-31 DIAGNOSIS — R739 Hyperglycemia, unspecified: Secondary | ICD-10-CM | POA: Diagnosis not present

## 2021-08-31 DIAGNOSIS — E782 Mixed hyperlipidemia: Secondary | ICD-10-CM

## 2021-08-31 DIAGNOSIS — G479 Sleep disorder, unspecified: Secondary | ICD-10-CM | POA: Insufficient documentation

## 2021-08-31 DIAGNOSIS — I10 Essential (primary) hypertension: Secondary | ICD-10-CM | POA: Diagnosis not present

## 2021-08-31 DIAGNOSIS — Z8673 Personal history of transient ischemic attack (TIA), and cerebral infarction without residual deficits: Secondary | ICD-10-CM

## 2021-08-31 LAB — LIPID PANEL
Cholesterol: 143 mg/dL (ref 0–200)
HDL: 38 mg/dL — ABNORMAL LOW (ref >39.00)
LDL Cholesterol: 82 mg/dL (ref 0–99)
NonHDL: 105.44
Total CHOL/HDL Ratio: 4
Triglycerides: 116 mg/dL (ref 0.0–149.0)
VLDL: 23.2 mg/dL (ref 0.0–40.0)

## 2021-08-31 LAB — COMPREHENSIVE METABOLIC PANEL
ALT: 21 U/L (ref 0–53)
AST: 17 U/L (ref 0–37)
Albumin: 4.2 g/dL (ref 3.5–5.2)
Alkaline Phosphatase: 54 U/L (ref 39–117)
BUN: 13 mg/dL (ref 6–23)
CO2: 29 mEq/L (ref 19–32)
Calcium: 9.1 mg/dL (ref 8.4–10.5)
Chloride: 106 mEq/L (ref 96–112)
Creatinine, Ser: 1.16 mg/dL (ref 0.40–1.50)
GFR: 60.01 mL/min (ref >60.00)
Glucose, Bld: 78 mg/dL (ref 70–99)
Potassium: 4 mEq/L (ref 3.5–5.1)
Sodium: 142 mEq/L (ref 135–145)
Total Bilirubin: 0.7 mg/dL (ref 0.2–1.2)
Total Protein: 7.1 g/dL (ref 6.0–8.3)

## 2021-08-31 LAB — HEMOGLOBIN A1C: Hgb A1c MFr Bld: 5.7 % (ref 4.6–6.5)

## 2021-08-31 MED ORDER — TRAZODONE HCL 50 MG PO TABS: 50.0000 mg | ORAL_TABLET | Freq: Every day | ORAL | 1 refills | Status: DC

## 2021-08-31 NOTE — Assessment & Plan Note (Signed)
Chronic Regular exercise and healthy diet encouraged Check lipid panel  Continue rosuvastatin 5 mg 4 times weekly

## 2021-08-31 NOTE — Assessment & Plan Note (Signed)
Chronic Check a1c Low sugar / carb diet Stressed regular exercise  

## 2021-08-31 NOTE — Assessment & Plan Note (Addendum)
Chronic Taking tylenol Pm or otc sleep aid They do not work well Advised avoiding OTC sleep medications Advised stopping/decreasing afternoon tea-usually has 1, sometimes 2 Encouraged 30 minutes of cardio daily  Has tried trazodone in the past and it did helped -- will retry 50 mg nightly-if this is not effective will increase to 100 mg nightly, which is what he was on previously

## 2021-08-31 NOTE — Assessment & Plan Note (Signed)
He has been experiencing episodes of what he calls blackouts, but may be presyncope Lasts a few seconds and he does not actually lose consciousness Associated with lightheadedness No obvious pattern Has had 6 or 8 episodes since having had a stroke and maybe 3 episodes prior to that [facility] neurology recently-concern over possible cardiac cause Denies chest pain, palpitations His wife will try to check his blood pressure during 1 of these episodes Referral ordered for cardiology

## 2021-08-31 NOTE — Assessment & Plan Note (Addendum)
History of stroke Does have some intermittent dizziness-likely residual from his stroke Continue Crestor 5 mg 4 times weekly Check lipid panel-goal LDL less than 70 Continue aspirin 81 mg daily Blood pressure somewhat controlled at home

## 2021-08-31 NOTE — Assessment & Plan Note (Addendum)
Chronic Blood pressure somewhat controlled-given his history of stroke would like his systolic blood pressure slightly lower, but given these blackout issues we will have him see cardiology first before adjusting medication CMP Continue losartan 50 mg daily

## 2021-09-02 IMAGING — MR MR HEAD W/O CM
12 of 13 series · 44 of 48 positions shown · non-contrast
Comparison: CTA head 10/30/2019

CLINICAL DATA: Status post tPA

EXAM:
MRI HEAD WITHOUT CONTRAST
TECHNIQUE: Multiplanar, multiecho pulse sequences of the brain and surrounding
structures were obtained without intravenous contrast.

[Series 5: DWI · axial · 3.0mm · 0.96mm/px · z∈[-70,+81]mm · 8 of 108 slices shown (1 of 4)]
[im 1/108]
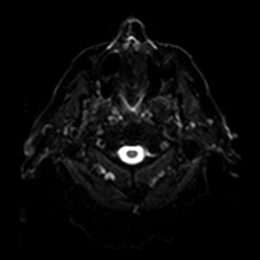
[im 16/108]
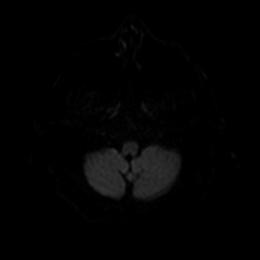
[im 31/108]
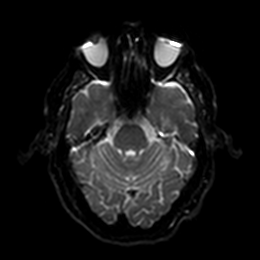
[im 46/108]
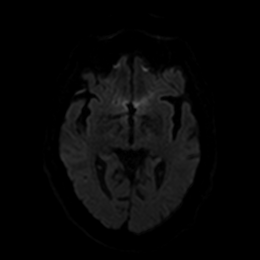
[im 62/108]
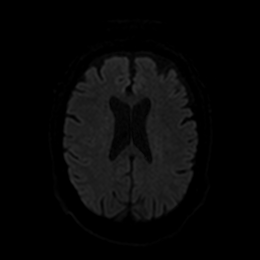
[im 77/108]
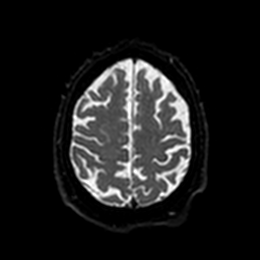
[im 92/108]
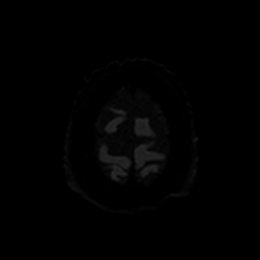
[im 108/108]
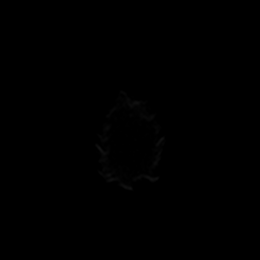

[Series 6: DWI · axial · 3.0mm · 0.96mm/px · z∈[-70,+81]mm · 4 of 54 slices shown (2 of 4)]
[im 1/54]
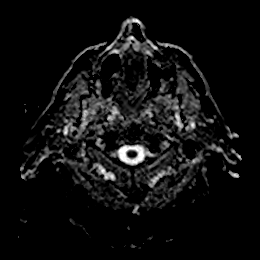
[im 18/54]
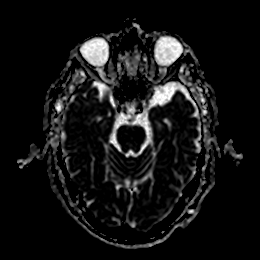
[im 36/54]
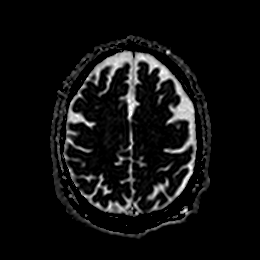
[im 54/54]
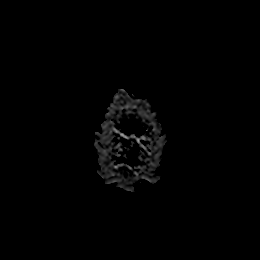

[Series 7: DWI · coronal · 4.0mm · 0.88mm/px · 5 of 76 slices shown (3 of 4)]
[im 1/76]
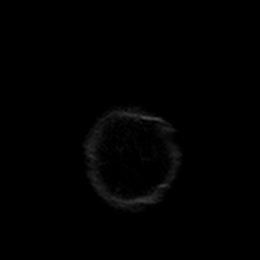
[im 19/76]
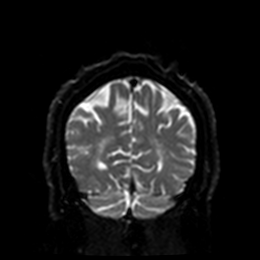
[im 38/76]
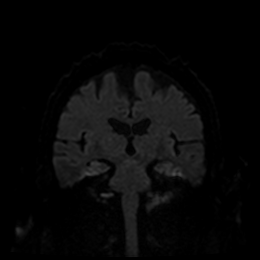
[im 57/76]
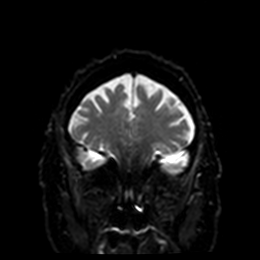
[im 76/76]
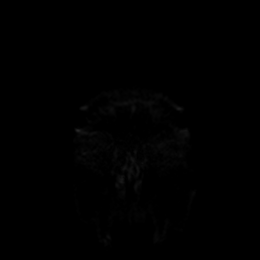

[Series 8: DWI · coronal · 4.0mm · 0.88mm/px · 3 of 38 slices shown (4 of 4)]
[im 1/38]
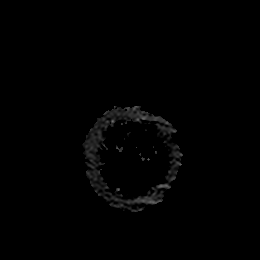
[im 19/38]
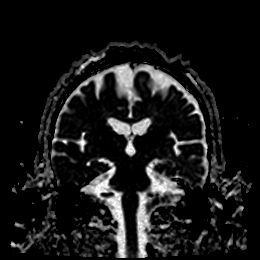
[im 38/38]
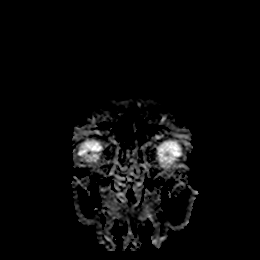

[Series 9: T1 · sagittal · 5.0mm · 0.78mm/px · 2 of 25 slices shown]
[im 1/25]
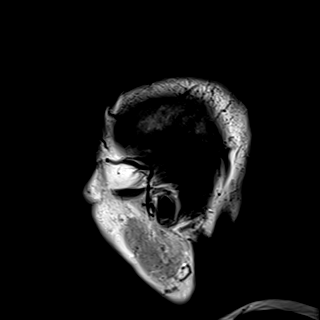
[im 25/25]
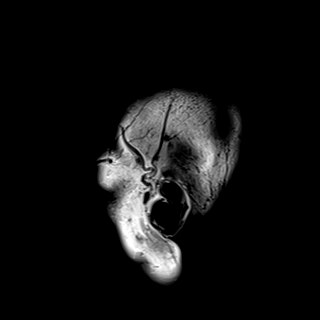

[Series 10: FLAIR · axial · 5.0mm · 0.45mm/px · z∈[-68,+75]mm · 2 of 26 slices shown]
[im 1/26]
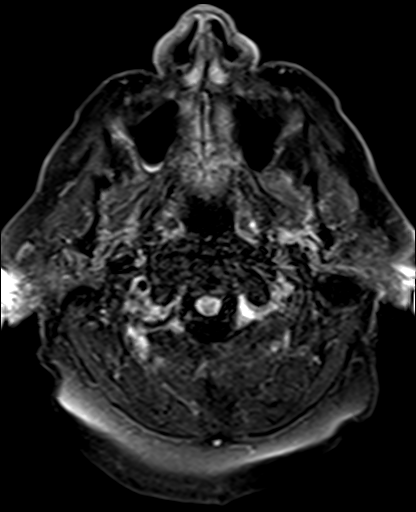
[im 26/26]
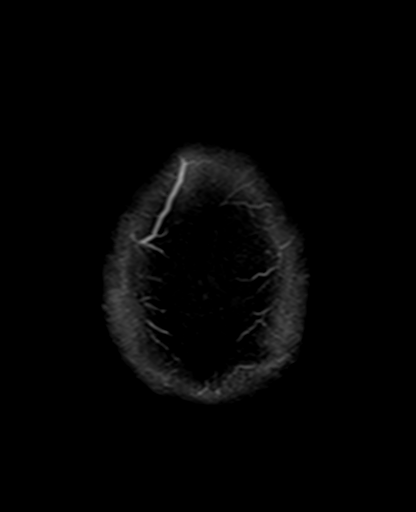

[Series 11: mag_images · axial · 3.0mm · 0.90mm/px · z∈[-81,+88]mm · 4 of 60 slices shown]
[im 1/60]
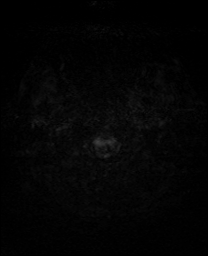
[im 20/60]
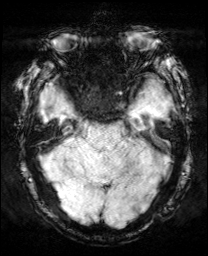
[im 40/60]
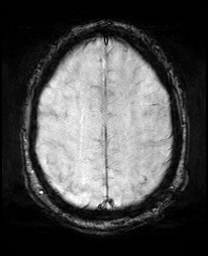
[im 60/60]
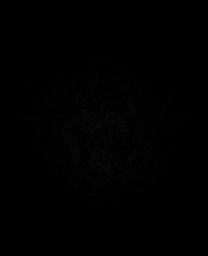

[Series 12: pha_images · axial · 3.0mm · 0.90mm/px · z∈[-81,+85]mm · 4 of 59 slices shown]
[im 1/59]
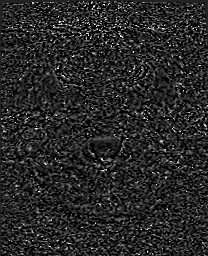
[im 20/59]
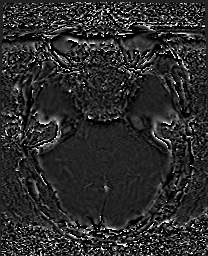
[im 39/59]
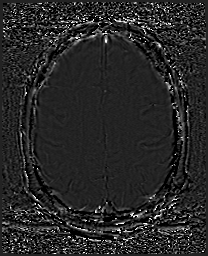
[im 59/59]
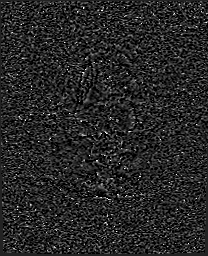

[Series 13: swi_images · axial · 3.0mm · 0.90mm/px · z∈[-81,+88]mm · 4 of 60 slices shown]
[im 1/60]
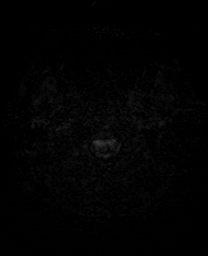
[im 20/60]
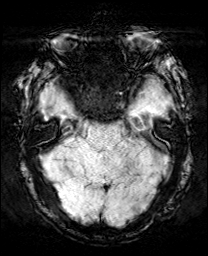
[im 40/60]
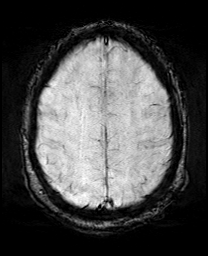
[im 60/60]
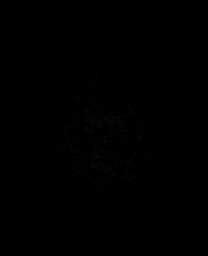

[Series 14: mip_images(sw) · axial · 24.0mm · 0.90mm/px · z∈[-71,+78]mm · 4 of 53 slices shown]
[im 1/53]
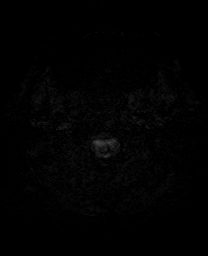
[im 18/53]
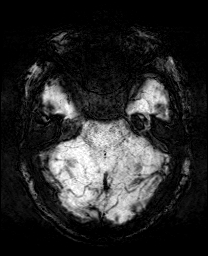
[im 35/53]
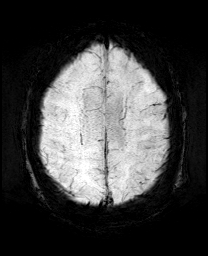
[im 53/53]
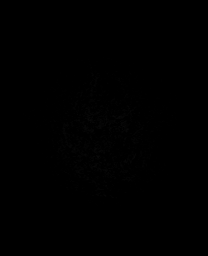

[Series 15: T2 · axial · 5.0mm · 0.72mm/px · z∈[-69,+74]mm · 2 of 26 slices shown (1 of 2)]
[im 1/26]
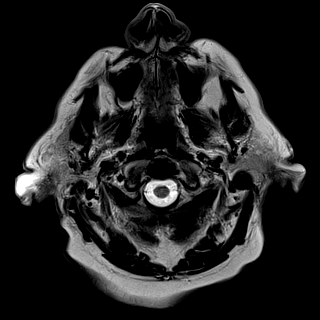
[im 26/26]
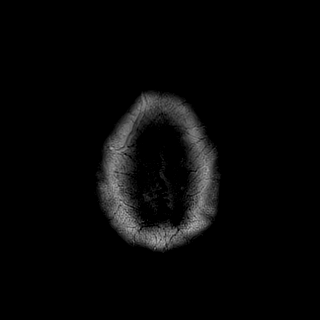

[Series 17: T2 · coronal · 5.0mm · 0.34mm/px · 2 of 31 slices shown (2 of 2)]
[im 1/31]
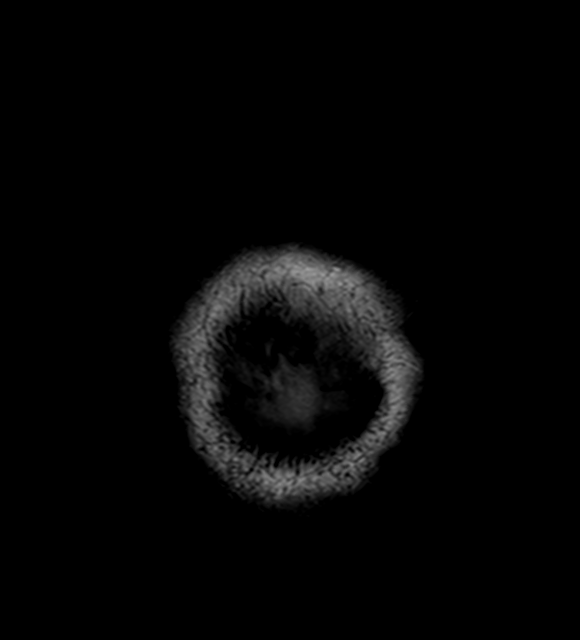
[im 31/31]
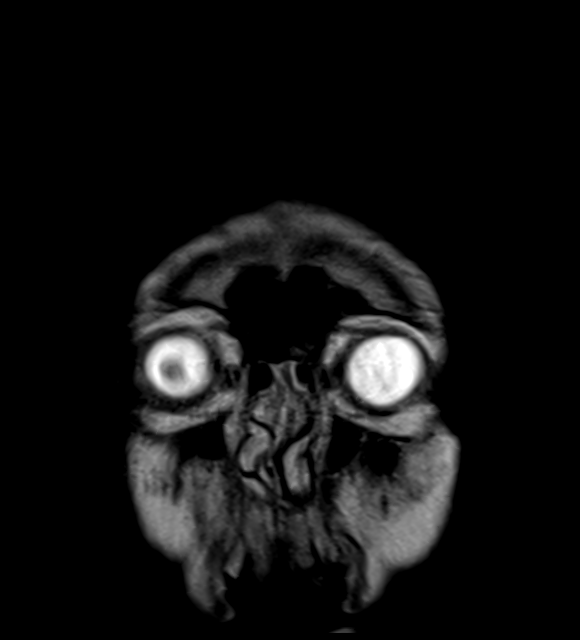

[44 of 48 positions shown; findings below may reference images not displayed]

FINDINGS: Brain: Small focus of abnormal diffusion restriction within the
dorsolateral right thalamus. Multifocal white matter hyperintensity,
most commonly due to chronic ischemic microangiopathy. Mild
generalized volume loss. 2 foci of chronic microhemorrhage within
the right parietal and temporal lobes. No acute hemorrhage. Normal
midline structures.

Vascular: Normal flow voids.

Skull and upper cervical spine: Normal marrow signal.

Sinuses/Orbits: Negative.

Other: None
IMPRESSION: 1. Small focus acute infarct within the dorsolateral right thalamus,
consistent with acute infarct.
2. Mild generalized volume loss and findings of chronic ischemic
microangiopathy.

## 2021-09-03 ENCOUNTER — Ambulatory Visit: Payer: Medicare Other | Admitting: Internal Medicine

## 2021-09-03 ENCOUNTER — Encounter: Payer: Self-pay | Admitting: Internal Medicine

## 2021-09-03 ENCOUNTER — Other Ambulatory Visit: Payer: Self-pay

## 2021-09-03 ENCOUNTER — Ambulatory Visit: Payer: Medicare Other | Admitting: Adult Health

## 2021-09-03 ENCOUNTER — Ambulatory Visit (INDEPENDENT_AMBULATORY_CARE_PROVIDER_SITE_OTHER): Payer: Medicare Other

## 2021-09-03 VITALS — BP 134/76 | HR 77 | Ht 70.0 in | Wt 222.6 lb

## 2021-09-03 DIAGNOSIS — R55 Syncope and collapse: Secondary | ICD-10-CM

## 2021-09-03 NOTE — Progress Notes (Unsigned)
Enrolled for [employer] to mail a ZIO XT long term holter monitor to the patients address on file.  

## 2021-09-03 NOTE — Progress Notes (Signed)
Cardiology Office Note:    Date:  09/03/2021   ID:  Joe Robertson, DOB 06-11-1941, MRN [id number]  PCP:  Binnie Rail, MD   [facility] Providers Cardiologist:  None     Referring MD: Binnie Rail, MD   No chief complaint on file. Presyncope  History of Present Illness:    Joe Robertson is a 81 y.o. male with a hx of CVA, HTN referral for pre-syncope  He reports that he is persistently dizzy. He's been falling. Twice he has fallen without blacking out. He was diagnosed with a stroke. This has been going on since he had a stroke.  The dizziness was thought to be a residual of the stroke. This was October 30, 2019. He notes that he can just black out. He is not out for a long period of time. He denies palpitations.  He is falling persistently, last time was yesterday. He's had multiple falls with blacking out.   He has no cardiac disease history. He denies stress test. No LHC. He has no hx of arrhythmia.  He has good blood pressure. He has no diagnosis of diabetes.  He denies chest pain, dypsnea on exertion. He denies orthopnea, PND, or LE edema.  He is a former smoker as a teenager.  Cardiology Studies  TTE 10/31/2019: normal LV function, normal RV function. No valve dx  CTA 10/30/2019:  no carotid stenosis. 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   Past Medical History:  Diagnosis Date   Hyperlipidemia    Stroke [facility]) 10/2019    Past Surgical History:  Procedure Laterality Date   CHOLECYSTECTOMY     INGUINAL HERNIA REPAIR Right 01/24/2017   Procedure: LAPAROSCOPIC RIGHT INGUINAL HERNIA WITH MESH;  Surgeon: Ralene Ok, MD;  Location: [facility];  Service: General;  Laterality: Right;   INSERTION OF MESH Right 01/24/2017   Procedure: INSERTION OF MESH;  Surgeon: Ralene Ok, MD;  Location: [facility];  Service: General;   Laterality: Right;    Current Medications: No outpatient medications have been marked as taking for the 09/03/21 encounter (Appointment) with Janina Mayo, MD.     Allergies:   Simvastatin and Lipitor [atorvastatin]   Social History   Socioeconomic History   Marital status: Married    Spouse name: Romie Minus   Number of children: Not on file   Years of education: Not on file   Highest education level: Professional school degree (e.g., MD, DDS, DVM, JD)  Occupational History    Comment: retired Geophysicist/field seismologist  Tobacco Use   Smoking status: Former   Smokeless tobacco: Never   Tobacco comments:    as teenager  Substance and Sexual Activity   Alcohol use: No   Drug use: No   Sexual activity: Not on file  Other Topics Concern   Not on file  Social History Narrative   Not on file   Social Determinants of Health   Financial Resource Strain: Low Risk    Difficulty of Paying Living Expenses: Not hard at all  Food Insecurity: No Food Insecurity   Worried About Charity fundraiser in the Last Year: Never true   [place] in the Last Year: Never true  Transportation Needs: No Transportation Needs   Lack of Transportation (Medical): No   Lack of Transportation (Non-Medical): No  Physical Activity: Sufficiently Active   Days  of Exercise per Week: 5 days   Minutes of Exercise per Session: 30 min  Stress: No Stress Concern Present   Feeling of Stress : Not at all  Social Connections: Socially Integrated   Frequency of Communication with Friends and Family: More than three times a week   Frequency of Social Gatherings with Friends and Family: More than three times a week   Attends Religious Services: More than 4 times per year   Active Member of [employer] or Organizations: Yes   Attends Music therapist: More than 4 times per year   Marital Status: Married     Family History: The patient's family history includes Arthritis in his father and mother; Cancer in his  paternal uncle; Diabetes in his maternal aunt and maternal grandmother; Heart disease in his father; Stroke in his maternal grandmother.  ROS:   Please see the history of present illness.     All other systems reviewed and are negative.  EKGs/Labs/Other Studies Reviewed:    The following studies were reviewed today:   EKG:  EKG is  ordered today.  The ekg ordered today demonstrates   NSR, no ischemic changes  Recent Labs: 02/27/2021: TSH 2.94 08/31/2021: ALT 21; BUN 13; Creatinine, Ser 1.16; Potassium 4.0; Sodium 142  Recent Lipid Panel    Component Value Date/Time   CHOL 143 08/31/2021 1000   TRIG 116.0 08/31/2021 1000   HDL 38.00 (L) 08/31/2021 1000   CHOLHDL 4 08/31/2021 1000   VLDL 23.2 08/31/2021 1000   LDLCALC 82 08/31/2021 1000   LDLDIRECT 147.0 02/02/2018 1021     Risk Assessment/Calculations:           Physical Exam:    VS:    Vitals:   09/03/21 0849  BP: 134/76  Pulse: 77  SpO2: 96%     Wt Readings from Last 3 Encounters:  08/31/21 222 lb (100.7 kg)  08/29/21 223 lb (101.2 kg)  02/27/21 217 lb (98.4 kg)     GEN:  Well nourished, well developed in no acute distress HEENT: Normal NECK: No JVD; No carotid bruits LYMPHATICS: No lymphadenopathy CARDIAC: RRR, no murmurs, rubs, gallops RESPIRATORY:  Clear to auscultation without rales, wheezing or rhonchi  ABDOMEN: Soft, non-tender, non-distended MUSCULOSKELETAL:  No edema; No deformity  SKIN: Warm and dry NEUROLOGIC:  Alert and oriented x 3 PSYCHIATRIC:  Normal affect   ASSESSMENT:    #Pre-synope: He has normal LV function, no interatrial shunting. He has no hx of arrhythmia. No symptoms of coronary disease. Will plan for a cardiac event monitor to assess for arrhythmia and follow up.  His EKG was unremarkable. Can assess for tachy or brady arrhythmia -7 day ziopatch  #HLD-:continue crestor. LDL 82 mg/dL at goal  PLAN:    In order of problems listed above:  Zio patch 7 day Follow up as  needed           Medication Adjustments/Labs and Tests Ordered: Current medicines are reviewed at length with the patient today.  Concerns regarding medicines are outlined above.  No orders of the defined types were placed in this encounter.  No orders of the defined types were placed in this encounter.   There are no Patient Instructions on file for this visit.   Signed, Janina Mayo, MD  09/03/2021 8:48 AM    [facility]

## 2021-09-03 NOTE — Patient Instructions (Signed)
Medication Instructions:  No Changes In Medications at this time.  *If you need a refill on your cardiac medications before your next appointment, please call your pharmacy*  Testing/Procedures:  [place] Monitor Instructions   Your physician has requested you wear your ZIO patch monitor 7 days.   This is a single patch monitor.  [employer] supplies one patch monitor per enrollment.  Additional stickers are not available.   Please do not apply patch if you will be having a Nuclear Stress Test, Echocardiogram, Cardiac CT, MRI, or Chest Xray during the time frame you would be wearing the monitor. The patch cannot be worn during these tests.  You cannot remove and re-apply the ZIO XT patch monitor.   Your ZIO patch monitor will be sent USPS Priority mail from [facility] directly to your home address. The monitor may also be mailed to a PO BOX if home delivery is not available.   It may take 3-5 days to receive your monitor after you have been enrolled.   Once you have received you monitor, please review enclosed instructions.  Your monitor has already been registered assigning a specific monitor serial # to you.   Applying the monitor   Shave hair from upper left chest.   Hold abrader disc by orange tab.  Rub abrader in 40 strokes over left upper chest as indicated in your monitor instructions.   Clean area with 4 enclosed alcohol pads .  Use all pads to assure are is cleaned thoroughly.  Let dry.   Apply patch as indicated in monitor instructions.  Patch will be place under collarbone on left side of chest with arrow pointing upward.   Rub patch adhesive wings for 2 minutes.Remove white label marked "1".  Remove white label marked "2".  Rub patch adhesive wings for 2 additional minutes.   While looking in a mirror, press and release button in center of patch.  A small green light will flash 3-4 times .  This will be your only indicator the monitor has been turned on.      Do not shower for the first 24 hours.  You may shower after the first 24 hours.   Press button if you feel a symptom. You will hear a small click.  Record Date, Time and Symptom in the Patient Log Book.   When you are ready to remove patch, follow instructions on last 2 pages of Patient Log Book.  Stick patch monitor onto last page of Patient Log Book.   Place Patient Log Book in [place] box.  Use locking tab on box and tape box closed securely.  The [employer] and [employer] has [employer] on it.  Please place in mailbox as soon as possible.  Your physician should have your test results approximately 7 days after the monitor has been mailed back to [facility].   Call [place] at [phone] if you have questions regarding your ZIO XT patch monitor.  Call them immediately if you see an orange light blinking on your monitor.   If your monitor falls off in less than 4 days contact our Monitor department at [phone].  If your monitor becomes loose or falls off after 4 days call [employer] at [phone] for suggestions on securing your monitor.     Follow-Up: At [facility], you and your health needs are our priority.  As part of our continuing mission to provide you with exceptional heart care, we have created designated  Provider Care Teams.  These Care Teams include your primary Cardiologist (physician) and Advanced Practice Providers (APPs -  Physician Assistants and Nurse Practitioners) who all work together to provide you with the care you need, when you need it.  We recommend signing up for the patient portal called "MyChart".  Sign up information is provided on this After Visit Summary.  MyChart is used to connect with patients for Virtual Visits (Telemedicine).  Patients are able to view lab/test results, encounter notes, upcoming appointments, etc.  Non-urgent messages can be sent to your provider as well.   To learn more about what you can do with MyChart, go  to [email or web address].    Your next appointment:   AS NEEDED   The format for your next appointment:   In Person  Provider:   Janina Mayo, MD

## 2021-09-06 DIAGNOSIS — R55 Syncope and collapse: Secondary | ICD-10-CM

## 2021-09-12 ENCOUNTER — Other Ambulatory Visit: Payer: Self-pay

## 2021-09-12 ENCOUNTER — Encounter: Payer: Self-pay | Admitting: Dermatology

## 2021-09-12 ENCOUNTER — Ambulatory Visit: Payer: Medicare Other | Admitting: Dermatology

## 2021-09-12 DIAGNOSIS — L570 Actinic keratosis: Secondary | ICD-10-CM

## 2021-09-12 DIAGNOSIS — D0439 Carcinoma in situ of skin of other parts of face: Secondary | ICD-10-CM

## 2021-09-12 DIAGNOSIS — Z1283 Encounter for screening for malignant neoplasm of skin: Secondary | ICD-10-CM | POA: Diagnosis not present

## 2021-09-12 DIAGNOSIS — L111 Transient acantholytic dermatosis [Grover]: Secondary | ICD-10-CM | POA: Diagnosis not present

## 2021-09-12 DIAGNOSIS — C44329 Squamous cell carcinoma of skin of other parts of face: Secondary | ICD-10-CM

## 2021-09-12 DIAGNOSIS — L821 Other seborrheic keratosis: Secondary | ICD-10-CM

## 2021-09-12 DIAGNOSIS — D0462 Carcinoma in situ of skin of left upper limb, including shoulder: Secondary | ICD-10-CM

## 2021-09-12 DIAGNOSIS — C4492 Squamous cell carcinoma of skin, unspecified: Secondary | ICD-10-CM

## 2021-09-12 DIAGNOSIS — D485 Neoplasm of uncertain behavior of skin: Secondary | ICD-10-CM

## 2021-09-12 HISTORY — DX: Squamous cell carcinoma of skin, unspecified: C44.92

## 2021-09-12 NOTE — Patient Instructions (Signed)

## 2021-09-17 ENCOUNTER — Telehealth: Payer: Self-pay

## 2021-09-17 ENCOUNTER — Telehealth: Payer: Self-pay | Admitting: *Deleted

## 2021-09-17 NOTE — Telephone Encounter (Signed)
-----   Message from Lavonna Monarch, MD sent at 09/14/2021  4:14 PM EST ----- Schedule as routine surgery with Dr. Darene Lamer; I will decide at that time whether the left back cheek is better treated with Mohs.

## 2021-09-17 NOTE — Telephone Encounter (Signed)
Phone call from patient's wife wanting his pathology results. Patient's wife aware of results.

## 2021-09-17 NOTE — Telephone Encounter (Signed)
Left message for patient to return our phone call for pathology results.

## 2021-09-18 DIAGNOSIS — R55 Syncope and collapse: Secondary | ICD-10-CM | POA: Diagnosis not present

## 2021-10-03 NOTE — Progress Notes (Signed)
New Patient   Subjective  Joe Robertson is a 81 y.o. male who presents for the following: Annual Exam (Lesion on left cheek & right cheek- scaly ).  General skin examination, several spots on cheek are not healing Location:  Duration:  Quality:  Associated Signs/Symptoms: Modifying Factors:  Severity:  Timing: Context:    The following portions of the chart were reviewed this encounter and updated as appropriate:  Tobacco   Allergies   Meds   Problems   Med Hx   Surg Hx   Fam Hx       Objective  Well appearing patient in no apparent distress; mood and affect are within normal limits. Scalp Waist up skin examination no atypical pigmented spots.  3 probable nonmelanoma skin cancers will be biopsied today.  Torso - Posterior (Back) Several 4-8 mm brown textured flattopped papules  Chest (Upper Torso, Anterior) 1+ dozen 2 to 3 mm slightly inflamed pink papules compatible with Grovers disease, currently asymptomatic  Left Arm, Left Preauricular Area, Right Arm, Right Buccal Cheek Gritty and hornlike 2 to 4 mm pink crusts  Left Upper Arm - Anterior 1 cm waxy pink crust, 8CM FROM SK        Left Parotid Area 9 mm waxy crust       Left Buccal Cheek 70mm crust with focal erosion         All skin waist up examined.   Assessment & Plan  Screening exam for skin cancer Scalp  Annual skin examination   Torus in the mouth   Seborrheic keratosis Torso - Posterior (Back)  Leave if stable  Grover's disease Chest (Upper Torso, Anterior)  Discussed the enigmatic nature of this common rash, to contact me if he develops symptoms  Actinic keratosis (4) Left Preauricular Area; Right Buccal Cheek; Left Arm; Right Arm  Destruction of lesion - Left Arm, Left Preauricular Area, Right Arm, Right Buccal Cheek Complexity: simple   Destruction method: cryotherapy   Informed consent: discussed and consent obtained   Timeout:  patient name, date of birth,  surgical site, and procedure verified Lesion destroyed using liquid nitrogen: Yes   Cryotherapy cycles:  3 Outcome: patient tolerated procedure well with no complications   Post-procedure details: wound care instructions given    Neoplasm of uncertain behavior of skin (3) Left Upper Arm - Anterior  Skin / nail biopsy Type of biopsy: tangential   Informed consent: discussed and consent obtained   Timeout: patient name, date of birth, surgical site, and procedure verified   Anesthesia: the lesion was anesthetized in a standard fashion   Anesthetic:  1% lidocaine w/ epinephrine 1-100,000 local infiltration Instrument used: flexible razor blade   Hemostasis achieved with: aluminum chloride and electrodesiccation   Outcome: patient tolerated procedure well   Post-procedure details: wound care instructions given    Specimen 1 - Surgical pathology Differential Diagnosis: R/O BCC VS SCC  Check Margins: No  Left Parotid Area  Skin / nail biopsy Type of biopsy: tangential   Informed consent: discussed and consent obtained   Timeout: patient name, date of birth, surgical site, and procedure verified   Anesthesia: the lesion was anesthetized in a standard fashion   Anesthetic:  1% lidocaine w/ epinephrine 1-100,000 local infiltration Instrument used: flexible razor blade   Hemostasis achieved with: aluminum chloride and electrodesiccation   Outcome: patient tolerated procedure well   Post-procedure details: wound care instructions given    Specimen 2 - Surgical pathology Differential Diagnosis: R/O BCC  VS SCC   Check Margins: No  Left Buccal Cheek  Skin / nail biopsy Type of biopsy: tangential   Informed consent: discussed and consent obtained   Timeout: patient name, date of birth, surgical site, and procedure verified   Anesthesia: the lesion was anesthetized in a standard fashion   Anesthetic:  1% lidocaine w/ epinephrine 1-100,000 local infiltration Instrument used:  flexible razor blade   Hemostasis achieved with: aluminum chloride and electrodesiccation   Outcome: patient tolerated procedure well   Post-procedure details: wound care instructions given    Specimen 3 - Surgical pathology Differential Diagnosis: R/O BCC VS SCC  Check Margins: No

## 2021-10-08 DIAGNOSIS — H25812 Combined forms of age-related cataract, left eye: Secondary | ICD-10-CM | POA: Diagnosis not present

## 2021-10-08 DIAGNOSIS — H25811 Combined forms of age-related cataract, right eye: Secondary | ICD-10-CM | POA: Diagnosis not present

## 2021-10-09 ENCOUNTER — Telehealth: Payer: Self-pay | Admitting: *Deleted

## 2021-10-09 NOTE — Telephone Encounter (Signed)
Received surgical clearance from [facility]. Placed on NP desk.

## 2021-10-09 NOTE — Telephone Encounter (Signed)
Medical clearance has been sent to [facility], fax confirmation has been received.

## 2021-10-09 NOTE — Telephone Encounter (Signed)
Signed and placed in outbox.  Thank you. ?

## 2021-10-11 ENCOUNTER — Ambulatory Visit (INDEPENDENT_AMBULATORY_CARE_PROVIDER_SITE_OTHER): Payer: Medicare Other | Admitting: Dermatology

## 2021-10-11 ENCOUNTER — Other Ambulatory Visit: Payer: Self-pay

## 2021-10-11 DIAGNOSIS — D0462 Carcinoma in situ of skin of left upper limb, including shoulder: Secondary | ICD-10-CM

## 2021-10-11 DIAGNOSIS — D0439 Carcinoma in situ of skin of other parts of face: Secondary | ICD-10-CM

## 2021-10-11 DIAGNOSIS — C44311 Basal cell carcinoma of skin of nose: Secondary | ICD-10-CM

## 2021-10-11 DIAGNOSIS — D099 Carcinoma in situ, unspecified: Secondary | ICD-10-CM

## 2021-10-11 NOTE — Patient Instructions (Signed)

## 2021-10-23 ENCOUNTER — Encounter: Payer: Self-pay | Admitting: Dermatology

## 2021-10-24 NOTE — Progress Notes (Signed)
° °  Follow-Up Visit   Subjective  Joe Robertson is a 81 y.o. male who presents for the following: Procedure (CIS x 2 L upper arm and L buccal cheek, SCC x1 L Parotid ).  Multiple biopsy-proven nonmelanoma skin cancers, will treat cheek and arm today. Location:  Duration:  Quality:  Associated Signs/Symptoms: Modifying Factors:  Severity:  Timing: Context:   Objective  Well appearing patient in no apparent distress; mood and affect are within normal limits. Left Upper Arm - Anterior Lesion identified by Dr.Orianna Biskup and nurse in room.    Left Buccal Cheek Lesion identified by Dr.Lotoya Casella and nurse in room.      A focused examination was performed including head, neck, arms. Relevant physical exam findings are noted in the Assessment and Plan.   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  Destruction of lesion Complexity: simple   Destruction method: electrodesiccation and curettage   Informed consent: discussed and consent obtained   Timeout:  patient name, date of birth, surgical site, and procedure verified Anesthesia: the lesion was anesthetized in a standard fashion   Anesthetic:  1% lidocaine w/ epinephrine 1-100,000 local infiltration Curettage performed in three different directions: Yes   Curettage cycles:  3 Lesion length (cm):  1.5 Lesion width (cm):  1.5 Margin per side (cm):  0 Final wound size (cm):  1.5 Hemostasis achieved with:  ferric subsulfate Outcome: patient tolerated procedure well with no complications   Post-procedure details: sterile dressing applied and wound care instructions given   Dressing type: bandage and petrolatum   Additional details:  Wound inoculated with 5% Fluorouracil.    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  Destruction of lesion Complexity: simple   Destruction method: electrodesiccation and curettage   Informed consent: discussed and consent  obtained   Timeout:  patient name, date of birth, surgical site, and procedure verified Anesthesia: the lesion was anesthetized in a standard fashion   Anesthetic:  1% lidocaine w/ epinephrine 1-100,000 local infiltration Curettage performed in three different directions: Yes   Curettage cycles:  3 Lesion length (cm):  1.4 Lesion width (cm):  1.4 Margin per side (cm):  0 Final wound size (cm):  1.4 Hemostasis achieved with:  ferric subsulfate Outcome: patient tolerated procedure well with no complications   Post-procedure details: sterile dressing applied and wound care instructions given   Dressing type: bandage and petrolatum   Additional details:  Wound inoculated with 5% Fluorouracil.        I, Lavonna Monarch, MD, have reviewed all documentation for this visit.  The documentation on 10/24/21 for the exam, diagnosis, procedures, and orders are all accurate and complete.

## 2021-10-25 ENCOUNTER — Encounter: Payer: Medicare Other | Admitting: Dermatology

## 2021-10-25 ENCOUNTER — Other Ambulatory Visit: Payer: Self-pay | Admitting: Internal Medicine

## 2021-10-25 DIAGNOSIS — H25811 Combined forms of age-related cataract, right eye: Secondary | ICD-10-CM | POA: Diagnosis not present

## 2021-11-09 DIAGNOSIS — H25812 Combined forms of age-related cataract, left eye: Secondary | ICD-10-CM | POA: Diagnosis not present

## 2021-12-07 DIAGNOSIS — H524 Presbyopia: Secondary | ICD-10-CM | POA: Diagnosis not present

## 2021-12-13 ENCOUNTER — Encounter: Payer: Self-pay | Admitting: Internal Medicine

## 2021-12-13 MED ORDER — TRAZODONE HCL 50 MG PO TABS: 100.0000 mg | ORAL_TABLET | Freq: Every day | ORAL | 1 refills | Status: DC

## 2021-12-24 ENCOUNTER — Telehealth: Payer: Self-pay | Admitting: Adult Health

## 2021-12-24 NOTE — Telephone Encounter (Signed)
Pt wife called requesting an earlier appt with Frann Rider.  ?Pt is having issues with dizziness, getting worse every day and wife states they are needing some help.  ?Scheduled pt for opening 7:45 12/25/2021 with Janett Billow.  ?

## 2021-12-24 NOTE — Telephone Encounter (Signed)
FYI

## 2021-12-25 ENCOUNTER — Encounter: Payer: Self-pay | Admitting: Adult Health

## 2021-12-25 ENCOUNTER — Ambulatory Visit: Payer: Medicare Other | Admitting: Adult Health

## 2021-12-25 ENCOUNTER — Telehealth: Payer: Self-pay | Admitting: Adult Health

## 2021-12-25 VITALS — Ht 70.0 in | Wt 217.0 lb

## 2021-12-25 DIAGNOSIS — R55 Syncope and collapse: Secondary | ICD-10-CM

## 2021-12-25 DIAGNOSIS — I6381 Other cerebral infarction due to occlusion or stenosis of small artery: Secondary | ICD-10-CM

## 2021-12-25 DIAGNOSIS — R42 Dizziness and giddiness: Secondary | ICD-10-CM

## 2021-12-25 DIAGNOSIS — I69319 Unspecified symptoms and signs involving cognitive functions following cerebral infarction: Secondary | ICD-10-CM | POA: Diagnosis not present

## 2021-12-25 NOTE — Telephone Encounter (Signed)
[employer] medicare NPR sent to [facility] they will reach out to schedule all 3 ?

## 2021-12-25 NOTE — Progress Notes (Signed)
?[facility] ?[address] ?[place] [address] ?[phone] ? ?     STROKE FOLLOW UP NOTE ? ?[name] ?Date of Birth:  October 06, 1940 ?Medical Record Number:  [id number]  ? ?Reason for Referral: stroke follow up ? ? ? ?SUBJECTIVE: ? ? ?CHIEF COMPLAINT:  ?Chief Complaint  ?Patient presents with  ? Follow-up  ?  RM 3 with spouse brenda  ?Pt is well, states dizziness has worsen since last visit   ? ? ? ?HPI:  ? ?Update 12/25/2021 JM: Patient returns for sooner follow-up visit due to ongoing concerns of dizziness.  He is accompanied by his wife.  Previously seen 08/29/2021 reporting intermittent dizziness with presyncope/syncopal events.  Encouraged cardiology evaluation which was completed 1/16 with Dr. Harl Bowie with completion of 7-day Zio patch which was unremarkable.  No further evaluation or follow-up recommended.  Reports dizziness has been persistent since his stroke in 10/2019 although did not first report this until 03/2020 and had a couple episodes of dizziness prior to hospitalization.  Dizziness has been progressively worsening since prior visit 4 months ago.  Of note, he c/o difficulty sleeping therefore started on trazodone in January with recent increase to 100 mg nightly. Reports took previously (shortly after stroke) without side effects.  ? ?Difficulty getting full details regarding dizziness due to baseline cognitive impairment but overall seems to only occur with position changes either first thing in the morning or occasionally during the day. Can last 5 to 6 minutes although he has not actually timed. At times can be severe where he has difficulty ambulating. He has not had any additional presyncope/syncopal events since 06/2021.  During dizziness episodes, denies headaches, confusion, visual changes, LOC, weakness/numbness, speech changes or altered mental status. He is aware of events, able to communicate with his wife. Denies onset with quick head or eye movements,  after prolonged standing or during increased stressful situations.  Does have baseline hearing loss with use of hearing aids but denies any worsening. He has been previously treated by ENT Dr. Lucia Gaskins for otitis (08/2019). Believes this dizziness has greatly impacted his quality of life. Wife notes generalized weakness and fatigue since his stroke and "just not the same". Does ambulate slower with use of cane for long distance, no recent falls. Denies LE numbness/tingling or pains. Denies low back or neck pain. Tries to be active around him home but no formal exercise regimen. Admits to limited to no water intake, will drink hot tea in the morning and occasionally drink a Gatorade.  Orthostatic vitals negative - BP laying 140/85 HR 81, standing 1 min BP 140/87 HR 85 and standing 3 min BP 141/85, HR 85.  Occasionally monitors blood pressure at home, has not yet checked blood pressure during dizziness episode as previously discussed. ? ?Otherwise, stable from stroke standpoint.  Denies new stroke/TIA symptoms.  Compliant on aspirin and Crestor, denies side effects.  Closely followed by PCP Dr. Quay Burow.  No further concerns at this time. ? ? ? ? ? ? ?History provided for reference purposes only ?Update 08/29/2021 JM: returns for 6 mo stroke f/u accompanied by his wife.  Overall stable.  Denies new stroke/TIA symptoms. C/o continued intermittent dizziness - did have a fall in November - states he started to feel very dizzy and then "blacked out" and lose consciousness that he believes only lasted for a few seconds. Once came to, denies confusion or any type of seizure activity. Dizziness typically upon getting up in the morning and randomly  during the day. Reports present since stroke but first reported this at visit in 03/2020 with similar presyncope/syncopal episode. Recommend eval by cardiology but never pursued this - no additional syncopal events until recently. Does not check blood pressure with dizziness although  checks daily and stable. Admits to limited water intake. Denies tinnitus or hearing loss. Denies dizziness/vertigo symptoms with quick head position changes. Dizziness is not debilitating - able to ambulate with use of cane. Cognition stable since prior visit.  Compliant on aspirin and Crestor -denies side effects.  Blood pressure today 156/88. Has f/u with PCP scheduled this Friday. No further concerns at this time.  ? ?Update 02/21/2021 JM: Mr. Chavarria returns for 37-monthstroke follow-up accompanied by his wife, BHassan Rowan  Stable since prior visit without new stroke/TIA symptoms.  He does report continued intermittent dizziness which has been present since his stroke as well as short-term memory loss which can fluctuate but denies specific worsening.  He continues to ambulate with a cane and denies any recent falls.  He stays active during the day and routinely does memory exercises.  He reports he sleeps well at night and has a good appetite. Reports compliance on aspirin and Crestor 5 mg three times weekly without associated side effects.  LDL 53 down from 124 on 10/11/2020.  Blood pressure today 139/70.  No further concerns at this time. ? ?Update 08/23/2020 JM: Mr. PPinedoreturns for 44-monthtroke follow-up accompanied by his wife.  He has been doing very well since prior visit with resolution of prior complaints including dizziness, insomnia and cognitive concerns.  He has unfortunately had a couple falls the first week of December.  Both of them in setting of slipping on a slick surface and unfortunately resulted in left hand fracture but otherwise no other injuries.  Denies falls occurring due to dizziness or lack of balance.  Denies new stroke/TIA symptoms.  Remains on aspirin and pravastatin 40 mg daily for secondary stroke prevention without side effects.  Lipid panel obtained by PCP 08/21/2020 with LDL 124 therefore Dr. BuQuay Burowlans on trialing Crestor as he has had difficulty tolerating high-dose statins.   Blood pressure today 134/74. Monitors at home which has been stable and similar to todays reading.  No concerns at this time. ? ?Update 04/18/2020 JM: Mr. 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  He also feels at times he has difficulty laying flat or will wake up gasping for air ?He has been experiencing dizziness since his stroke - unable to determine if he is experiencing a true dizziness sensation vs vertigo vs imbalance as he will start speaking of a different topic ?He does report 3 episodes of vision going black (aware of his surroundings), foggy headedness and dizziness resulting in a fall.  He did lose consciousness with 2 of those episodes.  Prior episode occurred on 6/16.  Denies dyspnea, heart palpitation or racing heart type sensation. ?Apparently, he has spoke to his PCP regarding above concerns but she feels as though more anxiety related ?He is concerned as there may be something else going on or possibly medication related ?He has been experiencing greater right hip pain recently receiving injection by orthopedics with benefit ?Currently using a cane for ambulation ?Remains on aspirin and pravastatin 40 mg daily for secondary stroke prevention without side effects ?Blood pressure today  142/76 ?Follows closely with PCP for HTN and HLD management ?No further concerns at this time ? ?Update 12/02/2019 JM:, Mr. Feasel is being seen for hospital follow-up. He has been doing well from a stroke standpoint with residual mild cognitive impairment but denies weakness or numbness/tingling. 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 He has completed 3 weeks DAPT and continues on aspirin alone without bleeding or bruising.  Continues  on pravastatin 40 mg daily tolerating well without side effects.  Blood pressure today  127/73 with similar home readings. PCP recently initiated losartan 50 mg daily for elevated BP and new diagnosis of HTN

## 2021-12-25 NOTE — Patient Instructions (Addendum)
Your Plan: ? ?You will be called to schedule an MRI of your brain and CTA (head and neck vessel imaging) to ensure no underlying causes contributing to your dizziness ? ?You will be scheduled for an EEG today prior to leaving to look for possible seizures ? ?You will be called to schedule evaluation with a physical therapist at [facility] neuro rehab for vestibular therapy ? ?Would recommend decreasing trazodone dose to only 1 tablet nightly for the next 3-4 days then stop to see if this helps improve the worsening of your dizziness ? ?Highly recommend increasing water intake to eventual amount of 64 ounces -100 oz per day ? ? ? ? ? ?Follow up in 4 months with Dr. Leonie Man for further evaluation of dizziness ? ? ? ? ? ?Thank you for coming to see [place] at [facility]. I hope we have been able to provide you high quality care today. ? ?You may receive a patient satisfaction survey over the next few weeks. We would appreciate your feedback and comments so that we may continue to improve ourselves and the health of our patients. ? ? ? ? ?Dizziness ?Dizziness is a common problem. It is a feeling of unsteadiness or light-headedness. You may feel like you are about to faint. Dizziness can lead to injury if you stumble or fall. Anyone can become dizzy, but dizziness is more common in older adults. This condition can be caused by a number of things, including medicines, dehydration, or illness. ?Follow these instructions at home: ?Eating and drinking ? ?Drink enough fluid to keep your urine pale yellow. This helps to keep you from becoming dehydrated. Try to drink more clear fluids, such as water. ?Do not drink alcohol. ?Limit your caffeine intake if told to do so by your health care provider. Check ingredients and nutrition facts to see if a food or beverage contains caffeine. ?Limit your salt (sodium) intake if told to do so by your health care provider. Check ingredients and nutrition facts to see if a food  or beverage contains sodium. ?Activity ? ?Avoid making quick movements. ?Rise slowly from chairs and steady yourself until you feel okay. ?In the morning, first sit up on the side of the bed. When you feel okay, stand slowly while you hold onto something until you know that your balance is good. ?If you need to stand in one place for a long time, move your legs often. Tighten and relax the muscles in your legs while you are standing. ?Do not drive or use machinery if you feel dizzy. ?Avoid bending down if you feel dizzy. Place items in your home so that they are easy for you to reach without leaning over. ?Lifestyle ?Do not use any products that contain nicotine or tobacco. These products include cigarettes, chewing tobacco, and vaping devices, such as e-cigarettes. If you need help quitting, ask your health care provider. ?Try to reduce your stress level by using methods such as yoga or meditation. Talk with your health care provider if you need help to manage your stress. ?General instructions ?Watch your dizziness for any changes. ?Take over-the-counter and prescription medicines only as told by your health care provider. Talk with your health care provider if you think that your dizziness is caused by a medicine that you are taking. ?Tell a friend or a family member that you are feeling dizzy. If he or she notices any changes in your behavior, have this person call your health care provider. ?Keep all follow-up visits.  This is important. ?Contact a health care provider if: ?Your dizziness does not go away or you have new symptoms. ?Your dizziness or light-headedness gets worse. ?You feel nauseous. ?You have reduced hearing. ?You have a fever. ?You have neck pain or a stiff neck. ?Your dizziness leads to an injury or a fall. ?Get help right away if: ?You vomit or have diarrhea and are unable to eat or drink anything. ?You have problems talking, walking, swallowing, or using your arms, hands, or legs. ?You feel  generally weak. ?You have any bleeding. ?You are not thinking clearly or you have trouble forming sentences. It may take a friend or family member to notice this. ?You have chest pain, abdominal pain, shortness of breath, or sweating. ?Your vision changes or you develop a severe headache. ?These symptoms may represent a serious problem that is an emergency. Do not wait to see if the symptoms will go away. Get medical help right away. Call your local emergency services (911 in the [place] Do not drive yourself to the hospital. ?Summary ?Dizziness is a feeling of unsteadiness or light-headedness. This condition can be caused by a number of things, including medicines, dehydration, or illness. ?Anyone can become dizzy, but dizziness is more common in older adults. ?Drink enough fluid to keep your urine pale yellow. Do not drink alcohol. ?Avoid making quick movements if you feel dizzy. Monitor your dizziness for any changes. ?This information is not intended to replace advice given to you by your health care provider. Make sure you discuss any questions you have with your health care provider. ?Document Revised: 07/10/2020 Document Reviewed: 07/10/2020 ?Elsevier Patient Education ? [place]. ? ? ? ?

## 2021-12-26 ENCOUNTER — Encounter: Payer: Medicare Other | Admitting: Physical Therapy

## 2021-12-26 ENCOUNTER — Ambulatory Visit: Payer: Medicare Other | Admitting: Neurology

## 2021-12-26 DIAGNOSIS — R55 Syncope and collapse: Secondary | ICD-10-CM | POA: Diagnosis not present

## 2021-12-26 DIAGNOSIS — I6381 Other cerebral infarction due to occlusion or stenosis of small artery: Secondary | ICD-10-CM

## 2021-12-26 DIAGNOSIS — R42 Dizziness and giddiness: Secondary | ICD-10-CM

## 2021-12-27 ENCOUNTER — Ambulatory Visit: Payer: Medicare Other

## 2021-12-27 NOTE — Progress Notes (Signed)
I agree with the above plan 

## 2022-01-01 NOTE — Progress Notes (Signed)
? ? ?  Subjective:  ? ? Patient ID: Joe Robertson, male    DOB: 21-Dec-1940, 81 y.o.   MRN: [id number] ? ? ? ? ? ?HPI ?Blaiden is here for  ?Chief Complaint  ?Patient presents with  ? Urinary Tract Infection  ? Back Pain  ?Wife helps with history given his dementia.  ? ?Has h/o BPH and was once on flomax and proscar - stopped them and at that time denied difficulty urinating. He denies difficulty urinating/emptying bladder.  He has a hx of renal stones. ? ?Yesterday had pain in his lower back.  The pain was severe grabbing sensation in the right lower back.  It started when he was doing something in the yard.  Earlier than day he did lift 40 lb of sand buckets, but did not have the pain at that time.  No radiation of the pain or N/T.  ? ?No urine symptoms.   No abdominal pain.  ? ? ?Medications and allergies reviewed with patient and updated if appropriate. ? ?Current Outpatient Medications on File Prior to Visit  ?Medication Sig Dispense Refill  ? acetaminophen (TYLENOL) 650 MG CR tablet Take 650 mg by mouth daily as needed.    ? aspirin EC 81 MG EC tablet Take 1 tablet (81 mg total) by mouth daily.    ? losartan (COZAAR) 50 MG tablet TAKE 1 TABLET(50 MG) BY MOUTH DAILY 90 tablet 1  ? rosuvastatin (CRESTOR) 5 MG tablet Take 1 tablet (5 mg total) by mouth 4 (four) times a week. 48 tablet 3  ? traZODone (DESYREL) 50 MG tablet Take 2-3 tablets (100-150 mg total) by mouth at bedtime. 200 tablet 1  ? vitamin E 1000 UNIT capsule Take 1,000 Units by mouth daily with breakfast.    ? ?No current facility-administered medications on file prior to visit.  ? ? ?Review of Systems  ?Constitutional:  Negative for fever.  ?Gastrointestinal:  Negative for abdominal pain and nausea.  ?Genitourinary:  Negative for difficulty urinating, dysuria, frequency, hematuria and urgency.  ?Musculoskeletal:  Positive for back pain.  ?Neurological:  Negative for numbness.  ? ?   ?Objective:  ? ?Vitals:  ? 01/02/22 1259  ?BP: 130/72  ?Pulse: 90   ?Temp: 97.9 ?F (36.6 ?C)  ?SpO2: 97%  ? ?BP Readings from Last 3 Encounters:  ?01/02/22 130/72  ?09/03/21 134/76  ?08/31/21 140/76  ? ?Wt Readings from Last 3 Encounters:  ?01/02/22 213 lb (96.6 kg)  ?12/25/21 217 lb (98.4 kg)  ?09/03/21 222 lb 9.6 oz (101 kg)  ? ?Body mass index is 30.56 kg/m?. ? ?  ?Physical Exam ?Constitutional:   ?   General: He is not in acute distress. ?   Appearance: Normal appearance.  ?HENT:  ?   Head: Normocephalic and atraumatic.  ?Abdominal:  ?   General: There is no distension.  ?   Palpations: Abdomen is soft.  ?   Tenderness: There is abdominal tenderness (mild in suprapubic regioin). There is no right CVA tenderness, left CVA tenderness, guarding or rebound.  ?Musculoskeletal:     ?   General: No tenderness (lumbar spine or lower back.   no increased pain with changes in position).  ?Skin: ?   General: Skin is warm and dry.  ?Neurological:  ?   Mental Status: He is alert.  ? ?   ? ? ? ? ? ?Assessment & Plan:  ? ? ?See Problem List for Assessment and Plan of chronic medical problems.  ? ? ? ? ?

## 2022-01-02 ENCOUNTER — Encounter: Payer: Self-pay | Admitting: Internal Medicine

## 2022-01-02 ENCOUNTER — Ambulatory Visit (INDEPENDENT_AMBULATORY_CARE_PROVIDER_SITE_OTHER): Payer: Medicare Other | Admitting: Internal Medicine

## 2022-01-02 ENCOUNTER — Other Ambulatory Visit: Payer: Self-pay

## 2022-01-02 ENCOUNTER — Ambulatory Visit (INDEPENDENT_AMBULATORY_CARE_PROVIDER_SITE_OTHER)
Admission: RE | Admit: 2022-01-02 | Discharge: 2022-01-02 | Disposition: A | Discharge status: Home / Self Care | Payer: Medicare Other | Source: Ambulatory Visit | Attending: Internal Medicine | Admitting: Internal Medicine

## 2022-01-02 VITALS — BP 130/72 | HR 90 | Temp 97.9°F | Ht 70.0 in | Wt 213.0 lb

## 2022-01-02 DIAGNOSIS — N200 Calculus of kidney: Secondary | ICD-10-CM | POA: Diagnosis not present

## 2022-01-02 DIAGNOSIS — K5730 Diverticulosis of large intestine without perforation or abscess without bleeding: Secondary | ICD-10-CM | POA: Diagnosis not present

## 2022-01-02 DIAGNOSIS — N281 Cyst of kidney, acquired: Secondary | ICD-10-CM | POA: Diagnosis not present

## 2022-01-02 DIAGNOSIS — N3001 Acute cystitis with hematuria: Secondary | ICD-10-CM | POA: Insufficient documentation

## 2022-01-02 DIAGNOSIS — R300 Dysuria: Secondary | ICD-10-CM

## 2022-01-02 DIAGNOSIS — R109 Unspecified abdominal pain: Secondary | ICD-10-CM | POA: Diagnosis not present

## 2022-01-02 DIAGNOSIS — K838 Other specified diseases of biliary tract: Secondary | ICD-10-CM | POA: Diagnosis not present

## 2022-01-02 LAB — POC URINALSYSI DIPSTICK (AUTOMATED)
Bilirubin, UA: 1
Glucose, UA: NEGATIVE
Ketones, UA: POSITIVE
Nitrite, UA: NEGATIVE
Protein, UA: POSITIVE — AB
Spec Grav, UA: >=1.03 — AB (ref 1.010–1.025)
Urobilinogen, UA: 1 E.U./dL
pH, UA: 6 (ref 5.0–8.0)

## 2022-01-02 MED ORDER — SULFAMETHOXAZOLE-TRIMETHOPRIM 800-160 MG PO TABS: 1.0000 | ORAL_TABLET | Freq: Two times a day (BID) | ORAL | 0 refills | Status: AC

## 2022-01-02 MED ORDER — TAMSULOSIN HCL 0.4 MG PO CAPS: 0.4000 mg | ORAL_CAPSULE | Freq: Every day | ORAL | 3 refills | Status: DC

## 2022-01-02 NOTE — Addendum Note (Signed)
Addended by: Marcina Millard on: 01/02/2022 02:33 PM ? ? Modules accepted: Orders ? ?

## 2022-01-02 NOTE — Patient Instructions (Signed)
? ? ?  Your urine looks like you have an infection or stone.  We will send it for culture.  ? ? ?Medications changes include :  flomax daily and Bactrim twice daily x 7 days  ? ? ?Your prescription(s) have been sent to your pharmacy.  ? ? ?A Ct was ordered to rule out stone.  ? ?

## 2022-01-02 NOTE — Assessment & Plan Note (Signed)
Acute ?Right lower back pain, bladder tenderness, UA suggestive of stone or UTI, h/o renal stones ?Stone vs UTI, msk less likely ?Given above - needs Ct scan to r/o stone - ordered ?UA with blood and possible infection - will start Bactrim until ucx recurs ?Start flomax daily ?Tylenol for pain - they will call if pain is not controlled ?

## 2022-01-02 NOTE — Assessment & Plan Note (Signed)
Acute ?Right lower back pain, bladder tenderness ?Urine dip consistent with UTI ?Will send urine for culture ?Start Bactrim DS bid x 7 days ?Take tylenol if needed.   ?Increase your water intake.  ?Call if no improvement  ? ?

## 2022-01-04 LAB — CULTURE, URINE COMPREHENSIVE: RESULT:: NO GROWTH

## 2022-01-08 ENCOUNTER — Ambulatory Visit: Payer: Medicare Other | Admitting: Dermatology

## 2022-01-08 ENCOUNTER — Encounter: Payer: Self-pay | Admitting: Dermatology

## 2022-01-08 DIAGNOSIS — C44529 Squamous cell carcinoma of skin of other part of trunk: Secondary | ICD-10-CM | POA: Diagnosis not present

## 2022-01-08 DIAGNOSIS — C4499 Other specified malignant neoplasm of skin, unspecified: Secondary | ICD-10-CM

## 2022-01-08 DIAGNOSIS — C44311 Basal cell carcinoma of skin of nose: Secondary | ICD-10-CM | POA: Diagnosis not present

## 2022-01-08 DIAGNOSIS — C4491 Basal cell carcinoma of skin, unspecified: Secondary | ICD-10-CM

## 2022-01-08 DIAGNOSIS — D485 Neoplasm of uncertain behavior of skin: Secondary | ICD-10-CM

## 2022-01-08 DIAGNOSIS — Z85828 Personal history of other malignant neoplasm of skin: Secondary | ICD-10-CM | POA: Diagnosis not present

## 2022-01-08 DIAGNOSIS — C44519 Basal cell carcinoma of skin of other part of trunk: Secondary | ICD-10-CM | POA: Diagnosis not present

## 2022-01-08 HISTORY — DX: Basal cell carcinoma of skin, unspecified: C44.91

## 2022-01-08 HISTORY — DX: Other specified malignant neoplasm of skin, unspecified: C44.99

## 2022-01-08 NOTE — Patient Instructions (Signed)

## 2022-01-15 ENCOUNTER — Telehealth: Payer: Self-pay

## 2022-01-15 NOTE — Telephone Encounter (Signed)
Phone call to patient with his pathology results. Patient aware of results. MOH's referral sent.

## 2022-01-15 NOTE — Telephone Encounter (Signed)
-----   Message from Lavonna Monarch, MD sent at 01/10/2022  4:24 PM EDT ----- Schedule surgery with Dr. Darene Lamer; I may at least schedule for the 1 near the right MRI to be done by Mohs surgery.

## 2022-01-18 ENCOUNTER — Other Ambulatory Visit: Payer: Medicare Other

## 2022-01-22 ENCOUNTER — Ambulatory Visit: Payer: Medicare Other | Admitting: Internal Medicine

## 2022-01-22 ENCOUNTER — Encounter: Payer: Self-pay | Admitting: Internal Medicine

## 2022-01-22 ENCOUNTER — Ambulatory Visit (INDEPENDENT_AMBULATORY_CARE_PROVIDER_SITE_OTHER): Payer: Medicare Other | Admitting: Internal Medicine

## 2022-01-22 ENCOUNTER — Ambulatory Visit (INDEPENDENT_AMBULATORY_CARE_PROVIDER_SITE_OTHER): Payer: Medicare Other

## 2022-01-22 DIAGNOSIS — M545 Low back pain, unspecified: Secondary | ICD-10-CM | POA: Diagnosis not present

## 2022-01-22 DIAGNOSIS — M25551 Pain in right hip: Secondary | ICD-10-CM | POA: Diagnosis not present

## 2022-01-22 DIAGNOSIS — M79604 Pain in right leg: Secondary | ICD-10-CM

## 2022-01-22 MED ORDER — TRAMADOL HCL 50 MG PO TABS
50.0000 mg | ORAL_TABLET | Freq: Three times a day (TID) | ORAL | 0 refills | Status: AC | PRN
Start: 2022-01-22 — End: 2022-01-27

## 2022-01-22 NOTE — Assessment & Plan Note (Signed)
Acute Unfortunately he is an extremely poor historian and at the very unclear about his symptoms and what is going on Right now he has no pain which is reassuring.  This rules out probable infection-epididymitis, scrotal issues, UTI Most likely it sounds like lumbar radiculopathy your hip pain He did have some right leg pain 2 years ago and he saw Dr. Marlou Sa at that time-it is hard to tell if this was the same type of pain or not We will get x-rays of his lumbar spine and right hip He did not take anything for his pain-advised that he take Tylenol as needed.  Reassured him that there is no possibility of getting addicted to the Tylenol We will give a small supply of tramadol from his wife to control and give him if he has severe pain again.  Discussed possible side effects of constipation, confusion, dizziness He is in not in any pain now will not do any further evaluation-they will let me know if he does have another episode May need to see orthopedics again

## 2022-01-22 NOTE — Progress Notes (Signed)
Subjective:    Patient ID: Joe Robertson, male    DOB: 18-Jul-1941, 81 y.o.   MRN: [id number]      HPI Bradan is here for  Chief Complaint  Patient presents with   Testicle Pain   He is here with his wife, but he provides most of the history, which is extremely poor secondary to his dementia.  Unfortunately, his wife is not able to contribute much.  A few days ago he had general pain that improved after a laxative.     A few days ago he started having "prostate pain" - it was severe last night.  He had to get up and walk to improve the pain.  He did have lower back pain. The pain was in the scrotum and down the right anterior leg.  Initially he stated there was no pain in the back of his leg and then later he stated that the pain was in the back of his leg and not the front of his leg.  He did not take anything for the pain.  No scrotal tenderness.     He denies any pain right now.  He has no scrotal pain, leg pain or back pain.  He denies any urinary symptoms.    Medications and allergies reviewed with patient and updated if appropriate.  Current Outpatient Medications on File Prior to Visit  Medication Sig Dispense Refill   acetaminophen (TYLENOL) 650 MG CR tablet Take 650 mg by mouth daily as needed.     aspirin EC 81 MG EC tablet Take 1 tablet (81 mg total) by mouth daily.     losartan (COZAAR) 50 MG tablet TAKE 1 TABLET(50 MG) BY MOUTH DAILY 90 tablet 1   rosuvastatin (CRESTOR) 5 MG tablet Take 1 tablet (5 mg total) by mouth 4 (four) times a week. 48 tablet 3   tamsulosin (FLOMAX) 0.4 MG CAPS capsule Take 1 capsule (0.4 mg total) by mouth daily. 30 capsule 3   traZODone (DESYREL) 50 MG tablet Take 2-3 tablets (100-150 mg total) by mouth at bedtime. 200 tablet 1   vitamin E 1000 UNIT capsule Take 1,000 Units by mouth daily with breakfast.     No current facility-administered medications on file prior to visit.    Review of Systems  Constitutional:  Negative for chills  and fever.  Gastrointestinal:  Negative for abdominal pain and nausea.  Genitourinary:  Negative for difficulty urinating, dysuria, frequency and hematuria.  Neurological:  Negative for numbness.      Objective:   Vitals:   01/22/22 1439  BP: 118/68  Pulse: 95  Temp: 98.4 F (36.9 C)  SpO2: 98%   BP Readings from Last 3 Encounters:  01/22/22 118/68  01/02/22 130/72  09/03/21 134/76   Wt Readings from Last 3 Encounters:  01/22/22 215 lb (97.5 kg)  01/02/22 213 lb (96.6 kg)  12/25/21 217 lb (98.4 kg)   Body mass index is 30.85 kg/m.    Physical Exam Constitutional:      General: He is not in acute distress.    Appearance: Normal appearance. He is not ill-appearing.  HENT:     Head: Normocephalic and atraumatic.  Musculoskeletal:        General: No tenderness (No tenderness lumbar spine, bilateral SI joints or across lower back, no posterior or anterior leg tenderness with palpation.  No pain with standing or sitting) or deformity.     Right lower leg: No edema.     Left  lower leg: No edema.  Skin:    General: Skin is warm and dry.  Neurological:     Mental Status: He is alert.     Sensory: No sensory deficit.  Psychiatric:        Mood and Affect: Mood normal.           Assessment & Plan:    See Problem List for Assessment and Plan of chronic medical problems.

## 2022-01-22 NOTE — Patient Instructions (Addendum)
     Xrays were ordered.  Have them downstairs.     Medications changes include :   take tylenol if needed for your pain.  If your pain is severe take the tramadol.        Return if symptoms worsen or fail to improve.

## 2022-02-02 ENCOUNTER — Encounter: Payer: Self-pay | Admitting: Dermatology

## 2022-02-02 NOTE — Progress Notes (Signed)
   Follow-Up Visit   Subjective  Joe Robertson is a 81 y.o. male who presents for the following: Follow-up (Right inner eye - wanted to recheck - no therapy done/- wife in room with patient ).  Follow-up previous skin cancers, possible growth spot below right inner eye and on chest. Location:  Duration:  Quality:  Associated Signs/Symptoms: Modifying Factors:  Severity:  Timing: Context:   Objective  Well appearing patient in no apparent distress; mood and affect are within normal limits. Right Nasal Sidewall Pearly 7 mm papule with telangiectasia, BCC       Chest - Medial (Center) Waxy focally eroded 1 cm crust, carcinoma       Left Zygomatic Area No sign recurrence of any of his previous nonmelanoma skin cancers    All skin waist up examined.   Assessment & Plan    Neoplasm of uncertain behavior of skin (2) Right Nasal Sidewall  Skin / nail biopsy Type of biopsy: tangential   Informed consent: discussed and consent obtained   Timeout: patient name, date of birth, surgical site, and procedure verified   Procedure prep:  Patient was prepped and draped in usual sterile fashion (Non sterile) Prep type:  Chlorhexidine Anesthesia: the lesion was anesthetized in a standard fashion   Anesthetic:  1% lidocaine w/ epinephrine 1-100,000 local infiltration Instrument used: flexible razor blade   Outcome: patient tolerated procedure well   Post-procedure details: wound care instructions given    Specimen 1 - Surgical pathology Differential Diagnosis: bcc vs scc  Check Margins: yes  Chest - Medial (Center)  Skin / nail biopsy Type of biopsy: tangential   Informed consent: discussed and consent obtained   Timeout: patient name, date of birth, surgical site, and procedure verified   Procedure prep:  Patient was prepped and draped in usual sterile fashion (Non sterile) Prep type:  Chlorhexidine Anesthesia: the lesion was anesthetized in a standard fashion    Anesthetic:  1% lidocaine w/ epinephrine 1-100,000 local infiltration Instrument used: flexible razor blade   Outcome: patient tolerated procedure well   Post-procedure details: wound care instructions given    Specimen 2 - Surgical pathology Differential Diagnosis: bcc vs scc  Check Margins: yes  Personal history of skin cancer Left Zygomatic Area  Check as needed change      I, Lavonna Monarch, MD, have reviewed all documentation for this visit.  The documentation on 02/02/22 for the exam, diagnosis, procedures, and orders are all accurate and complete.

## 2022-02-18 ENCOUNTER — Telehealth: Payer: Self-pay | Admitting: Neurology

## 2022-02-18 NOTE — Telephone Encounter (Signed)
Rescheduled appointment with pt's wife over the phone - MD out

## 2022-02-22 ENCOUNTER — Other Ambulatory Visit: Payer: Self-pay | Admitting: Internal Medicine

## 2022-02-28 ENCOUNTER — Ambulatory Visit: Payer: Medicare Other | Admitting: Adult Health

## 2022-03-03 ENCOUNTER — Encounter: Payer: Self-pay | Admitting: Internal Medicine

## 2022-03-03 NOTE — Progress Notes (Signed)
Subjective:    Patient ID: Joe Robertson, male    DOB: July 29, 1941, 81 y.o.   MRN: [id number]     HPI Joe Robertson is here for follow up of his chronic medical problems, including htn, hld, h/o CVA, hyperglycemia OA  Dizziness-cardiac work-up negative for cause.  Neuro did not feel dizziness was related to cva - advised coming off trazodone to see if that helped.  He was referred to vestibular rehab.  Did not want to do PT, so has not done lab.  Still has dizziness.    Sleeping okay without trazodone.  Review Ct scan - ? Do MRI of liver  Constipation - using miralax daily but has watery stools.    Medications and allergies reviewed with patient and updated if appropriate.  Current Outpatient Medications on File Prior to Visit  Medication Sig Dispense Refill   acetaminophen (TYLENOL) 650 MG CR tablet Take 650 mg by mouth daily as needed.     aspirin EC 81 MG EC tablet Take 1 tablet (81 mg total) by mouth daily.     losartan (COZAAR) 50 MG tablet TAKE 1 TABLET(50 MG) BY MOUTH DAILY 90 tablet 1   rosuvastatin (CRESTOR) 5 MG tablet Take 1 tablet (5 mg total) by mouth 4 (four) times a week. 48 tablet 3   tamsulosin (FLOMAX) 0.4 MG CAPS capsule Take 1 capsule (0.4 mg total) by mouth daily. 30 capsule 3   vitamin E 1000 UNIT capsule Take 1,000 Units by mouth daily with breakfast.     No current facility-administered medications on file prior to visit.     Review of Systems  Constitutional:  Negative for fever.  Respiratory:  Negative for cough, shortness of breath and wheezing.   Cardiovascular:  Negative for chest pain, palpitations and leg swelling.  Gastrointestinal:  Positive for constipation. Negative for abdominal pain.  Neurological:  Positive for dizziness. Negative for light-headedness and headaches.       Objective:   Vitals:   03/04/22 0848  BP: 130/70  Pulse: 80  Temp: 98 F (36.7 C)  SpO2: 98%   BP Readings from Last 3 Encounters:  03/04/22 130/70   01/22/22 118/68  01/02/22 130/72   Wt Readings from Last 3 Encounters:  03/04/22 210 lb (95.3 kg)  01/22/22 215 lb (97.5 kg)  01/02/22 213 lb (96.6 kg)   Body mass index is 30.13 kg/m.    Physical Exam Constitutional:      General: He is not in acute distress.    Appearance: Normal appearance. He is not ill-appearing.  HENT:     Head: Normocephalic and atraumatic.  Eyes:     Conjunctiva/sclera: Conjunctivae normal.  Cardiovascular:     Rate and Rhythm: Normal rate and regular rhythm.     Heart sounds: Normal heart sounds. No murmur heard. Pulmonary:     Effort: Pulmonary effort is normal. No respiratory distress.     Breath sounds: Normal breath sounds. No wheezing or rales.  Musculoskeletal:     Right lower leg: No edema.     Left lower leg: No edema.  Skin:    General: Skin is warm and dry.     Findings: No rash.  Neurological:     Mental Status: He is alert. Mental status is at baseline.  Psychiatric:        Mood and Affect: Mood normal.        Lab Results  Component Value Date   WBC 9.3 08/21/2020  HGB 15.8 08/21/2020   HCT 47.2 08/21/2020   PLT 267.0 08/21/2020   GLUCOSE 92 03/04/2022   CHOL 158 03/04/2022   TRIG 115.0 03/04/2022   HDL 40.30 03/04/2022   LDLDIRECT 147.0 02/02/2018   LDLCALC 95 03/04/2022   ALT 18 03/04/2022   AST 17 03/04/2022   NA 139 03/04/2022   K 4.5 03/04/2022   CL 104 03/04/2022   CREATININE 1.15 03/04/2022   BUN 17 03/04/2022   CO2 27 03/04/2022   TSH 2.94 02/27/2021   INR 1.0 10/30/2019   HGBA1C 5.8 03/04/2022     Assessment & Plan:    See Problem List for Assessment and Plan of chronic medical problems.

## 2022-03-03 NOTE — Patient Instructions (Addendum)
     Blood work was ordered.     Medications changes include :   none     An MRI of your liver was ordered.     Someone will call you to schedule an appointment.   PT ordered.     Return in about 6 months (around 09/04/2022) for follow up.

## 2022-03-04 ENCOUNTER — Ambulatory Visit (INDEPENDENT_AMBULATORY_CARE_PROVIDER_SITE_OTHER): Payer: Medicare Other | Admitting: Internal Medicine

## 2022-03-04 VITALS — BP 130/70 | HR 80 | Temp 98.0°F | Ht 70.0 in | Wt 210.0 lb

## 2022-03-04 DIAGNOSIS — Z8673 Personal history of transient ischemic attack (TIA), and cerebral infarction without residual deficits: Secondary | ICD-10-CM

## 2022-03-04 DIAGNOSIS — I10 Essential (primary) hypertension: Secondary | ICD-10-CM | POA: Diagnosis not present

## 2022-03-04 DIAGNOSIS — E782 Mixed hyperlipidemia: Secondary | ICD-10-CM

## 2022-03-04 DIAGNOSIS — R739 Hyperglycemia, unspecified: Secondary | ICD-10-CM

## 2022-03-04 DIAGNOSIS — M159 Polyosteoarthritis, unspecified: Secondary | ICD-10-CM

## 2022-03-04 DIAGNOSIS — N200 Calculus of kidney: Secondary | ICD-10-CM | POA: Insufficient documentation

## 2022-03-04 DIAGNOSIS — N400 Enlarged prostate without lower urinary tract symptoms: Secondary | ICD-10-CM

## 2022-03-04 DIAGNOSIS — K838 Other specified diseases of biliary tract: Secondary | ICD-10-CM | POA: Insufficient documentation

## 2022-03-04 DIAGNOSIS — R42 Dizziness and giddiness: Secondary | ICD-10-CM | POA: Insufficient documentation

## 2022-03-04 DIAGNOSIS — G479 Sleep disorder, unspecified: Secondary | ICD-10-CM

## 2022-03-04 LAB — COMPREHENSIVE METABOLIC PANEL
ALT: 18 U/L (ref 0–53)
AST: 17 U/L (ref 0–37)
Albumin: 4.2 g/dL (ref 3.5–5.2)
Alkaline Phosphatase: 53 U/L (ref 39–117)
BUN: 17 mg/dL (ref 6–23)
CO2: 27 mEq/L (ref 19–32)
Calcium: 9.2 mg/dL (ref 8.4–10.5)
Chloride: 104 mEq/L (ref 96–112)
Creatinine, Ser: 1.15 mg/dL (ref 0.40–1.50)
GFR: 60.42 mL/min (ref >60.00)
Glucose, Bld: 92 mg/dL (ref 70–99)
Potassium: 4.5 mEq/L (ref 3.5–5.1)
Sodium: 139 mEq/L (ref 135–145)
Total Bilirubin: 0.7 mg/dL (ref 0.2–1.2)
Total Protein: 6.9 g/dL (ref 6.0–8.3)

## 2022-03-04 LAB — LIPID PANEL
Cholesterol: 158 mg/dL (ref 0–200)
HDL: 40.3 mg/dL (ref >39.00)
LDL Cholesterol: 95 mg/dL (ref 0–99)
NonHDL: 117.59
Total CHOL/HDL Ratio: 4
Triglycerides: 115 mg/dL (ref 0.0–149.0)
VLDL: 23 mg/dL (ref 0.0–40.0)

## 2022-03-04 LAB — HEMOGLOBIN A1C: Hgb A1c MFr Bld: 5.8 % (ref 4.6–6.5)

## 2022-03-04 NOTE — Assessment & Plan Note (Signed)
Chronic Check a1c Low sugar / carb diet Stressed regular exercise  

## 2022-03-04 NOTE — Assessment & Plan Note (Signed)
Chronic Blood pressure well controlled CMP Continue losartan 50 mg daily 

## 2022-03-04 NOTE — Assessment & Plan Note (Addendum)
Chronic History of stroke with no residual Continue Crestor 5 mg 4 times weekly-advised to try increasing to 5 times weekly Check lipid panel-goal LDL less than 70 Continue aspirin 81 mg daily

## 2022-03-04 NOTE — Assessment & Plan Note (Signed)
Chronic 6 currently sleeping well with melatonin-doing well without trazodone so would recommend continuing off of it

## 2022-03-04 NOTE — Assessment & Plan Note (Signed)
Seen on CT scan from May 2023-likely related to history of cholecystectomy Asymptomatic Will check MRI to rule out other causes

## 2022-03-04 NOTE — Assessment & Plan Note (Signed)
Chronic Regular exercise and healthy diet encouraged Check lipid panel  Continue Crestor 5 mg 4 times weekly

## 2022-03-04 NOTE — Assessment & Plan Note (Signed)
Chronic No issues with emptying bladder Prostatomegaly seen on CT scan Encouraged him to continue Flomax

## 2022-03-04 NOTE — Assessment & Plan Note (Signed)
Chronic Cardiac work-up did not reveal cause Neurology does not feel this is related to history of CVA Marked BPPV Neurology had referred him to PT, but he did not go-resistant to do physical therapy, but agrees to try-new referral ordered

## 2022-03-05 ENCOUNTER — Ambulatory Visit: Payer: Medicare Other | Attending: Internal Medicine | Admitting: Physical Therapy

## 2022-03-05 ENCOUNTER — Other Ambulatory Visit: Payer: Self-pay

## 2022-03-05 DIAGNOSIS — Z8673 Personal history of transient ischemic attack (TIA), and cerebral infarction without residual deficits: Secondary | ICD-10-CM | POA: Diagnosis not present

## 2022-03-05 DIAGNOSIS — R42 Dizziness and giddiness: Secondary | ICD-10-CM | POA: Insufficient documentation

## 2022-03-05 DIAGNOSIS — R2681 Unsteadiness on feet: Secondary | ICD-10-CM | POA: Insufficient documentation

## 2022-03-05 NOTE — Therapy (Signed)
OUTPATIENT PHYSICAL THERAPY VESTIBULAR EVALUATION     Patient Name: Joe Robertson MRN: [id number] DOB:08/08/1941, 81 y.o., male Today's Date: 03/05/2022  PCP: Binnie Rail, MD REFERRING PROVIDER: Binnie Rail, MD   PT End of Session - 03/05/22 1745     Visit Number 1    Number of Visits 9    Date for PT Re-Evaluation 04/05/22    Authorization Type [employer] Medicare    PT Start Time 1533    PT Stop Time 1620    PT Time Calculation (min) 47 min    Activity Tolerance Patient tolerated treatment well    Behavior During Therapy [facility] for tasks assessed/performed             Past Medical History:  Diagnosis Date   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   Chest Medial (center)   Hyperlipidemia    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   Right Nasal Sidewall   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   Left Parotid Area (well diff)   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   Left Buccal Cheek (in situ)   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   Left Upper Arm - Anterior (in situ)   Stroke ([place]) 10/2019   Past Surgical History:  Procedure Laterality Date   CHOLECYSTECTOMY     INGUINAL HERNIA REPAIR Right 01/24/2017   Procedure: LAPAROSCOPIC RIGHT INGUINAL HERNIA WITH MESH;  Surgeon: Ralene Ok, MD;  Location: [place];  Service: General;  Laterality: Right;   INSERTION OF MESH Right 01/24/2017   Procedure: INSERTION OF MESH;  Surgeon: Ralene Ok, MD;  Location: [facility] OR;  Service: General;  Laterality: Right;   Patient Active Problem List   Diagnosis Date Noted   Dilated intrahepatic bile duct 03/04/2022   Dizziness 03/04/2022   Nephrolithiasis 03/04/2022   Right leg pain 01/22/2022   Acute cystitis with hematuria 01/02/2022   Difficulty sleeping 08/31/2021   Pre-syncope 08/31/2021   Memory difficulties 02/27/2021   Hyperglycemia 08/18/2020   Hypertension 11/16/2019   Obesity 11/02/2019   Family hx-stroke 11/02/2019    History of stroke, right thalamic, s/p tPA, 10/2019 10/30/2019   Localized osteoarthrosis of right hip, mod-sev 07/22/2019   Digital mucinous cyst of finger 02/09/2019   Skin abnormalities 06/17/2018   Chronic pain of right knee 06/17/2018   Wears hearing aid 07/04/2016   History of nephrolithiasis 07/03/2016   Hyperlipidemia 06/29/2016   Primary osteoarthritis 06/29/2016   Vitamin D deficiency 06/29/2016   BPH (benign prostatic hyperplasia) 06/29/2016    ONSET DATE: 03/04/2022  REFERRING DIAG:  Q76.19 (ICD-10-CM) - History of stroke  R42 (ICD-10-CM) - Dizziness    THERAPY DIAG:  Dizziness and giddiness  Unsteadiness on feet  Rationale for Evaluation and Treatment Rehabilitation  SUBJECTIVE:   SUBJECTIVE STATEMENT: Very dizzy and require the cane to keep from falling.  CVA was 11/02/2019.   Pt accompanied by: significant other  PERTINENT HISTORY: HLD, HTN, Hx of CVA, OA   PAIN:  Are you having pain? No  PRECAUTIONS: Fall  FALLS: Has patient fallen in last 6 months? No  LIVING ENVIRONMENT: Lives with: lives with their spouse Lives in: House/apartment Stairs: Yes: External: 3 steps; none Has following equipment at home: Single point cane  PLOF: Independent  PATIENT GOALS To feel like I'm not so dizzy and unsteady all the time.  OBJECTIVE:   DIAGNOSTIC FINDINGS: MRI of brain has been ordered  COGNITION: Overall cognitive status: Within functional limits for tasks assessed  Cervical ROM:    Active A/PROM (deg) eval  Flexion WFL  Extension WFL  Right lateral flexion   Left lateral flexion   Right rotation WFL  Left rotation WFL  (Blank rows = not tested)  STRENGTH: NT during eval today   BED MOBILITY:  Sit to supine Complete Independence Supine to sit Complete Independence  TRANSFERS: Assistive device utilized: None  Sit to stand: Modified independence Stand to sit: Modified independence  GAIT: Gait pattern:  slowed, cautious, uses  cane Distance walked: 50 ft Assistive device utilized: Single point cane Level of assistance: SBA Comments: Reports staggering at times with gait  PATIENT SURVEYS:  FOTO Risk adjusted intake score:  54; predicted score at d/c:  59   VESTIBULAR ASSESSMENT   GENERAL OBSERVATION: Pt jokes with therapist throughout session, hard for patient to describe dizziness without using that word.    SYMPTOM BEHAVIOR:   Subjective history: Reports this has been going on since CVA 2021.  Very careful about taking steps.  Get extremely tired.  Unsteady would be a good word to describe.   Non-Vestibular symptoms:  Hx of CVA   Type of dizziness: Imbalance (Disequilibrium) and Unsteady with head/body turns   Frequency: Occurs daily   Duration: can last all day   Aggravating factors: Induced by position change: rolling to the right and supine to sit, Induced by motion: turning body quickly, turning head quickly, and activity in general, and Worse in the morning   Relieving factors:  sleep it off    Progression of symptoms: unchanged   OCULOMOTOR EXAM:   Ocular Alignment: normal L eye hypometria   Ocular ROM: No Limitations   Spontaneous Nystagmus:  L eye   Gaze-Induced Nystagmus: absent   Smooth Pursuits: intact   Saccades: intact   Convergence/Divergence: 18 cm      VESTIBULAR - OCULAR REFLEX:    Slow VOR: Normal   VOR Cancellation: Corrective Saccades   Head-Impulse Test: HIT Right: negative HIT Left: negative   Dynamic Visual Acuity: Not able to be assessed   X 1 viewing:  difficult to coordinate, no dizziness; slowed motion   POSITIONAL TESTING: Right Dix-Hallpike: no nystagmus Left Dix-Hallpike: no nystagmus and dizziness upon return to sit (lightheaded) Right Roll Test: no nystagmus Left Roll Test: no nystagmus Other: Lightheaded upon return to sit    MOTION SENSITIVITY:    Motion Sensitivity Quotient  Intensity: 0 = none, 1 = Lightheaded, 2 = Mild, 3 = Moderate, 4 = Severe, 5 =  Vomiting  Intensity  1. Sitting to supine   2. Supine to L side   3. Supine to R side   4. Supine to sitting   5. L Hallpike-Dix   6. Up from L    7. R Hallpike-Dix   8. Up from R    9. Sitting, head  tipped to L knee   10. Head up from L  knee   11. Sitting, head  tipped to R knee   12. Head up from R  knee   13. Sitting head turns x5   14.Sitting head nods x5   15. In stance, 180  turn to L    16. In stance, 180  turn to R      FUNCTIONAL GAIT:  M-CTSIB  Condition 1: Firm Surface, EO 30 Sec, Normal Sway  Condition 2: Firm Surface, EC 30 Sec, Normal Sway  Condition 3: Foam Surface, EO 30 Sec, Mild Sway  Condition 4: Foam Surface, EC  30 Sec, Mild Sway      PATIENT EDUCATION: Education details: PT eval results, POC Person educated: Patient and Spouse Education method: Explanation Education comprehension: verbalized understanding   GOALS: Goals reviewed with patient? Yes  SHORT TERM GOALS: = LTGs   LONG TERM GOALS: Target date: 04/05/2022  Pt will be independent with HEP for improved dizziness and balance. Baseline:  Goal status: INITIAL  2.  Pt will improve FOTO score to at least 59 for improved functional mobility. Baseline:  Goal status: INITIAL  3.  Pt will perform Conditions 3 and 4 on MCTSIB with minimal to normal sway. Baseline:  Goal status: INITIAL  4.  DGI to be performed with goal to be written as appropriate. Baseline:  Goal status: INITIAL  ASSESSMENT:  CLINICAL IMPRESSION: Patient is a 81 y.o. male  who was seen today for physical therapy evaluation and treatment for dizziness post CVA.   Pt had CVA in March 2021 and reports increased dizziness, that has stayed the same since that time.  He reports more unsteadiness than room spinning type sensations.  He is not able to clearly describe the dizziness or specific situations that bring it on, only that sometimes he staggers when he walks.  No nystagmus noted or symptoms reported with  positional BPPV testing today.  He does c/o lightheadedness with quick positional changes from supine>sit (unable to assess orthostatics due to time constraints today).  He does have some slowed pace eye movements with x1 viewing, but no c/o dizziness.  With MCTSIB testing for initial balance measure, he does have increased sway on last 2 conditions.  He may benefit from further skilled PT to address habituation exercises, gaze stabilization and balance (with further dynamic balance testing to be assessed).     OBJECTIVE IMPAIRMENTS Abnormal gait, decreased balance, decreased mobility, and dizziness.   ACTIVITY LIMITATIONS bending, standing, bed mobility, and locomotion level  PARTICIPATION LIMITATIONS: community activity and yard work  PERSONAL FACTORS 3+ comorbidities: see above  are also affecting patient's functional outcome.   REHAB POTENTIAL: Good  CLINICAL DECISION MAKING: Evolving/moderate complexity  EVALUATION COMPLEXITY: Moderate   PLAN: PT FREQUENCY: 2x/week  PT DURATION: 4 weeks  PLANNED INTERVENTIONS: Therapeutic exercises, Therapeutic activity, Neuromuscular re-education, Balance training, Gait training, Patient/Family education, Self Care, Vestibular training, and Canalith repositioning  PLAN FOR NEXT SESSION: Check orthostatics, assess DGI and write LTG.  Initiate HEP for gaze stabilization, balance on compliant surfaces, gait and balance.   Demichael Traum W., PT 03/05/2022, 5:46 PM

## 2022-03-11 NOTE — Therapy (Signed)
OUTPATIENT PHYSICAL THERAPY VESTIBULAR TREATMENT     Patient Name: Joe Robertson MRN: [id number] DOB:October 25, 1940, 81 y.o., male Today's Date: 03/12/2022  PCP: Binnie Rail, MD REFERRING PROVIDER: Binnie Rail, MD   PT End of Session - 03/12/22 0932     Visit Number 2    Number of Visits 9    Date for PT Re-Evaluation 04/05/22    Authorization Type BCBS Medicare    PT Start Time 0846    PT Stop Time 0931    PT Time Calculation (min) 45 min    Equipment Utilized During Treatment Gait belt    Activity Tolerance Patient tolerated treatment well    Behavior During Therapy WFL for tasks assessed/performed              Past Medical History:  Diagnosis Date   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   Chest Medial (center)   Hyperlipidemia    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   Right Nasal Sidewall   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   Left Parotid Area (well diff)   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   Left Buccal Cheek (in situ)   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   Left Upper Arm - Anterior (in situ)   Stroke ([place]) 10/2019   Past Surgical History:  Procedure Laterality Date   CHOLECYSTECTOMY     INGUINAL HERNIA REPAIR Right 01/24/2017   Procedure: LAPAROSCOPIC RIGHT INGUINAL HERNIA WITH MESH;  Surgeon: Ralene Ok, MD;  Location: [place];  Service: General;  Laterality: Right;   INSERTION OF MESH Right 01/24/2017   Procedure: INSERTION OF MESH;  Surgeon: Ralene Ok, MD;  Location: [facility] OR;  Service: General;  Laterality: Right;   Patient Active Problem List   Diagnosis Date Noted   Dilated intrahepatic bile duct 03/04/2022   Dizziness 03/04/2022   Nephrolithiasis 03/04/2022   Right leg pain 01/22/2022   Acute cystitis with hematuria 01/02/2022   Difficulty sleeping 08/31/2021   Pre-syncope 08/31/2021   Memory difficulties 02/27/2021   Hyperglycemia 08/18/2020   Hypertension 11/16/2019    Obesity 11/02/2019   Family hx-stroke 11/02/2019   History of stroke, right thalamic, s/p tPA, 10/2019 10/30/2019   Localized osteoarthrosis of right hip, mod-sev 07/22/2019   Digital mucinous cyst of finger 02/09/2019   Skin abnormalities 06/17/2018   Chronic pain of right knee 06/17/2018   Wears hearing aid 07/04/2016   History of nephrolithiasis 07/03/2016   Hyperlipidemia 06/29/2016   Primary osteoarthritis 06/29/2016   Vitamin D deficiency 06/29/2016   BPH (benign prostatic hyperplasia) 06/29/2016    ONSET DATE: 03/04/2022  REFERRING DIAG:  I45.80 (ICD-10-CM) - History of stroke  R42 (ICD-10-CM) - Dizziness    THERAPY DIAG:  Dizziness and giddiness  Unsteadiness on feet  Rationale for Evaluation and Treatment Rehabilitation  SUBJECTIVE:   SUBJECTIVE STATEMENT: Feeling about the same. Still having dizziness.  Pt accompanied by: significant other  PERTINENT HISTORY: HLD, HTN, Hx of CVA, OA   PAIN:  Are you having pain? No  PRECAUTIONS: Fall  PATIENT GOALS To feel like I'm not so dizzy and unsteady all the time.  OBJECTIVE:     TODAY'S TREATMENT: 03/12/22   Orthostatic Testing   Supine Sitting Standing  x1 Minute Standing x 3 Minutes  BP 136/82 119/76 *dizzy 120/77 *dizzy 132/80  HR 78 84 85 87     Activity Comments  R HIT Negative   L HIT Slightly positive  R/L brandt daroff With  PT assist to increase speed  R/L brandt daroff EC With PT assist to increase speed; c/o slight increase in dizziness    Romberg with EC 30" Mild sway  Standing EC head turns/nods 10x each No dizziness; mild sway with head nods  Standing head turns/nods + gaze on target 30" each No dizziness; pt with trouble maintaining gaze on target    [facility] PT Assessment - 03/12/22 0001       Standardized Balance Assessment   Standardized Balance Assessment Dynamic Gait Index      Dynamic Gait Index   Level Surface Normal    Change in Gait Speed Normal    Gait with  Horizontal Head Turns Mild Impairment    Gait with Vertical Head Turns Normal    Gait and Pivot Turn Mild Impairment    Step Over Obstacle Moderate Impairment    Step Around Obstacles Normal    Steps Normal    Total Score 20             PATIENT EDUCATION: Education details: edu on exam findings and HEP Person educated: Patient and Spouse Education method: Explanation, Demonstration, Tactile cues, Verbal cues, and Handouts Education comprehension: verbalized understanding   Below measures were taken at time of initial evaluation unless otherwise specified:  DIAGNOSTIC FINDINGS: MRI of brain has been ordered  COGNITION: Overall cognitive status: Within functional limits for tasks assessed  Cervical ROM:    Active A/PROM (deg) eval  Flexion WFL  Extension WFL  Right lateral flexion   Left lateral flexion   Right rotation WFL  Left rotation WFL  (Blank rows = not tested)  STRENGTH: NT during eval today   BED MOBILITY:  Sit to supine Complete Independence Supine to sit Complete Independence  TRANSFERS: Assistive device utilized: None  Sit to stand: Modified independence Stand to sit: Modified independence  GAIT: Gait pattern:  slowed, cautious, uses cane Distance walked: 50 ft Assistive device utilized: Single point cane Level of assistance: SBA Comments: Reports staggering at times with gait  PATIENT SURVEYS:  FOTO Risk adjusted intake score:  54; predicted score at d/c:  59   VESTIBULAR ASSESSMENT   GENERAL OBSERVATION: Pt jokes with therapist throughout session, hard for patient to describe dizziness without using that word.    SYMPTOM BEHAVIOR:   Subjective history: Reports this has been going on since CVA 2021.  Very careful about taking steps.  Get extremely tired.  Unsteady would be a good word to describe.   Non-Vestibular symptoms:  Hx of CVA   Type of dizziness: Imbalance (Disequilibrium) and Unsteady with head/body turns   Frequency: Occurs  daily   Duration: can last all day   Aggravating factors: Induced by position change: rolling to the right and supine to sit, Induced by motion: turning body quickly, turning head quickly, and activity in general, and Worse in the morning   Relieving factors:  sleep it off    Progression of symptoms: unchanged   OCULOMOTOR EXAM:   Ocular Alignment: normal L eye hypometria   Ocular ROM: No Limitations   Spontaneous Nystagmus:  L eye   Gaze-Induced Nystagmus: absent   Smooth Pursuits: intact   Saccades: intact   Convergence/Divergence: 18 cm      VESTIBULAR - OCULAR REFLEX:    Slow VOR: Normal   VOR Cancellation: Corrective Saccades   Head-Impulse Test: HIT Right: negative HIT Left: negative   Dynamic Visual Acuity: Not able to be assessed   X 1 viewing:  difficult  to coordinate, no dizziness; slowed motion   POSITIONAL TESTING: Right Dix-Hallpike: no nystagmus Left Dix-Hallpike: no nystagmus and dizziness upon return to sit (lightheaded) Right Roll Test: no nystagmus Left Roll Test: no nystagmus Other: Lightheaded upon return to sit    MOTION SENSITIVITY:    Motion Sensitivity Quotient  Intensity: 0 = none, 1 = Lightheaded, 2 = Mild, 3 = Moderate, 4 = Severe, 5 = Vomiting  Intensity  1. Sitting to supine   2. Supine to L side   3. Supine to R side   4. Supine to sitting   5. L Hallpike-Dix   6. Up from L    7. R Hallpike-Dix   8. Up from R    9. Sitting, head  tipped to L knee   10. Head up from L  knee   11. Sitting, head  tipped to R knee   12. Head up from R  knee   13. Sitting head turns x5   14.Sitting head nods x5   15. In stance, 180  turn to L    16. In stance, 180  turn to R      FUNCTIONAL GAIT:  M-CTSIB  Condition 1: Firm Surface, EO 30 Sec, Normal Sway  Condition 2: Firm Surface, EC 30 Sec, Normal Sway  Condition 3: Foam Surface, EO 30 Sec, Mild Sway  Condition 4: Foam Surface, EC 30 Sec, Mild Sway      PATIENT EDUCATION: Education  details: PT eval results, POC Person educated: Patient and Spouse Education method: Explanation Education comprehension: verbalized understanding   GOALS: Goals reviewed with patient? Yes  SHORT TERM GOALS: = LTGs   LONG TERM GOALS: Target date: 04/05/2022  Pt will be independent with HEP for improved dizziness and balance. Baseline:  Goal status: IN PROGRESS  2.  Pt will improve FOTO score to at least 59 for improved functional mobility. Baseline:  Goal status: IN PROGRESS  3.  Pt will perform Conditions 3 and 4 on MCTSIB with minimal to normal sway. Baseline:  Goal status: IN PROGRESS  4.  Patient to score 22/24 on DGI in order to demonstrate improved stability.  Baseline: 03/12/22 20/24 Goal status: IN PROGRESS  ASSESSMENT:  CLINICAL IMPRESSION: Patient arrived to session with report of unchanged dizziness. Checked orthostatics at start of session which were negative, however patient did report increased dizziness upon supine>sit and STS. Patient scored 20/24 on DGI, indicating a decreased risk of falls. Most difficulty observed with stepping over obstacle however no dizziness was reported. With repeated testing, L HIT slightly positive which may possibly explain some of patient's symptoms. Worked on balance activities, gaze stabilization, and habituation activities. Patient only with c/o dizziness with EC Nestor Lewandowsky but did demonstrate poor gaze stability with head turns/nods. Educated patient and wife on HEP and safety precautions with exercise. No complaints at end of session.    OBJECTIVE IMPAIRMENTS Abnormal gait, decreased balance, decreased mobility, and dizziness.   ACTIVITY LIMITATIONS bending, standing, bed mobility, and locomotion level  PARTICIPATION LIMITATIONS: community activity and yard work  PERSONAL FACTORS 3+ comorbidities: see above  are also affecting patient's functional outcome.   REHAB POTENTIAL: Good  CLINICAL DECISION MAKING:  Evolving/moderate complexity  EVALUATION COMPLEXITY: Moderate   PLAN: PT FREQUENCY: 2x/week  PT DURATION: 4 weeks  PLANNED INTERVENTIONS: Therapeutic exercises, Therapeutic activity, Neuromuscular re-education, Balance training, Gait training, Patient/Family education, Self Care, Vestibular training, and Canalith repositioning  PLAN FOR NEXT SESSION: progress gaze stabilization, habituation, balance with EC,  gait, and stepping over obstacles    Janene Harvey, PT, DPT 03/12/22 9:41 AM  [facility] at [facility] [place], [place], [place] [address] Phone # [phone] Fax # [phone]

## 2022-03-12 ENCOUNTER — Encounter: Payer: Self-pay | Admitting: Physical Therapy

## 2022-03-12 ENCOUNTER — Ambulatory Visit: Payer: Medicare Other | Admitting: Physical Therapy

## 2022-03-12 DIAGNOSIS — R2681 Unsteadiness on feet: Secondary | ICD-10-CM

## 2022-03-12 DIAGNOSIS — R42 Dizziness and giddiness: Secondary | ICD-10-CM | POA: Diagnosis not present

## 2022-03-12 DIAGNOSIS — Z8673 Personal history of transient ischemic attack (TIA), and cerebral infarction without residual deficits: Secondary | ICD-10-CM | POA: Diagnosis not present

## 2022-03-14 ENCOUNTER — Ambulatory Visit: Payer: Medicare Other | Admitting: Physical Therapy

## 2022-03-15 ENCOUNTER — Encounter: Payer: Self-pay | Admitting: Internal Medicine

## 2022-03-15 ENCOUNTER — Ambulatory Visit
Admission: RE | Admit: 2022-03-15 | Discharge: 2022-03-15 | Disposition: A | Discharge status: Home / Self Care | Payer: Medicare Other | Source: Ambulatory Visit | Attending: Internal Medicine | Admitting: Internal Medicine

## 2022-03-15 DIAGNOSIS — K838 Other specified diseases of biliary tract: Secondary | ICD-10-CM

## 2022-03-15 DIAGNOSIS — N281 Cyst of kidney, acquired: Secondary | ICD-10-CM | POA: Diagnosis not present

## 2022-03-15 DIAGNOSIS — K449 Diaphragmatic hernia without obstruction or gangrene: Secondary | ICD-10-CM | POA: Diagnosis not present

## 2022-03-15 DIAGNOSIS — K5730 Diverticulosis of large intestine without perforation or abscess without bleeding: Secondary | ICD-10-CM | POA: Diagnosis not present

## 2022-03-15 MED ORDER — GADOBENATE DIMEGLUMINE 529 MG/ML IV SOLN
20.0000 mL | Freq: Once | INTRAVENOUS | Status: AC | PRN
  Administered 2022-03-15: 20 mL via INTRAVENOUS

## 2022-03-19 ENCOUNTER — Encounter: Payer: Self-pay | Admitting: Physical Therapy

## 2022-03-19 ENCOUNTER — Encounter: Payer: Self-pay | Admitting: Dermatology

## 2022-03-19 ENCOUNTER — Ambulatory Visit: Payer: Medicare Other | Attending: Internal Medicine | Admitting: Physical Therapy

## 2022-03-19 DIAGNOSIS — R42 Dizziness and giddiness: Secondary | ICD-10-CM | POA: Insufficient documentation

## 2022-03-19 DIAGNOSIS — R2681 Unsteadiness on feet: Secondary | ICD-10-CM | POA: Diagnosis not present

## 2022-03-19 NOTE — Therapy (Signed)
I spoke with Mr. Attridge wife regarding events of session today- explained to her how patient's behavior was inappropriate throughout session (trying to hold PT's hand when hips were being guarded during balance exercises, making a comment of "can I hold you?" When watching PT demonstrate an exercise).   Explained that it is standard to document this sort of behavior when it occurs in sessions.  Mrs. Botting then told me they would like to cancel all of his sessions and they would prefer to go to therapy somewhere else completely.  Thank you for the opportunity to participate in his care, we are happy to have them return if they change their minds.  Ann Lions PT, DPT, PN2   Supplemental Physical Therapist [place]

## 2022-03-19 NOTE — Therapy (Signed)
OUTPATIENT PHYSICAL THERAPY VESTIBULAR TREATMENT     Patient Name: Joe Robertson MRN: [id number] DOB:31-Jan-1941, 81 y.o., male Today's Date: 03/19/2022  PCP: Binnie Rail, MD REFERRING PROVIDER: Binnie Rail, MD   PT End of Session - 03/19/22 0925     Visit Number 3    Number of Visits 9    Date for PT Re-Evaluation 04/05/22    Authorization Type BCBS Medicare    PT Start Time 0846    PT Stop Time 0926    PT Time Calculation (min) 40 min    Activity Tolerance Patient tolerated treatment well    Behavior During Therapy [facility] for tasks assessed/performed               Past Medical History:  Diagnosis Date   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   Chest Medial (center)   Hyperlipidemia    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   Right Nasal Sidewall   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   Left Parotid Area (well diff)   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   Left Buccal Cheek (in situ)   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   Left Upper Arm - Anterior (in situ)   Stroke ([place]) 10/2019   Past Surgical History:  Procedure Laterality Date   CHOLECYSTECTOMY     INGUINAL HERNIA REPAIR Right 01/24/2017   Procedure: LAPAROSCOPIC RIGHT INGUINAL HERNIA WITH MESH;  Surgeon: Ralene Ok, MD;  Location: [place];  Service: General;  Laterality: Right;   INSERTION OF MESH Right 01/24/2017   Procedure: INSERTION OF MESH;  Surgeon: Ralene Ok, MD;  Location: [facility] OR;  Service: General;  Laterality: Right;   Patient Active Problem List   Diagnosis Date Noted   Dilated intrahepatic bile duct 03/04/2022   Dizziness 03/04/2022   Nephrolithiasis 03/04/2022   Right leg pain 01/22/2022   Acute cystitis with hematuria 01/02/2022   Difficulty sleeping 08/31/2021   Pre-syncope 08/31/2021   Memory difficulties 02/27/2021   Hyperglycemia 08/18/2020   Hypertension 11/16/2019   Obesity 11/02/2019   Family hx-stroke  11/02/2019   History of stroke, right thalamic, s/p tPA, 10/2019 10/30/2019   Localized osteoarthrosis of right hip, mod-sev 07/22/2019   Digital mucinous cyst of finger 02/09/2019   Skin abnormalities 06/17/2018   Chronic pain of right knee 06/17/2018   Wears hearing aid 07/04/2016   History of nephrolithiasis 07/03/2016   Hyperlipidemia 06/29/2016   Primary osteoarthritis 06/29/2016   Vitamin D deficiency 06/29/2016   BPH (benign prostatic hyperplasia) 06/29/2016    ONSET DATE: 03/04/2022  REFERRING DIAG:  V77.93 (ICD-10-CM) - History of stroke  R42 (ICD-10-CM) - Dizziness    THERAPY DIAG:  Dizziness and giddiness  Unsteadiness on feet  Rationale for Evaluation and Treatment Rehabilitation  SUBJECTIVE:   SUBJECTIVE STATEMENT: I'm still dizzy, I had a tough day yesterday. Doing OK with HEP per wife. I still know that the dizziness is there, I'm still acclimating to it.  Pt accompanied by: significant other  PERTINENT HISTORY: HLD, HTN, Hx of CVA, OA   PAIN:  Are you having pain? No  PRECAUTIONS: Fall  PATIENT GOALS To feel like I'm not so dizzy and unsteady all the time.  OBJECTIVE:   TODAY'S TREATMENT 03/19/22  Horizontal and vertical VORs 3x60 seconds each progressing speed each time  STS with horizontal and vertical VORs 4x10 ( 2 each)  Seated hip strength: clams with green TB 2x20, marches 2x20 green TB   Balance: -  tandem stance on blue foam pad 3x30 seconds B  - toe taps on 6 inch step solid surface 1x20 then lateral step taps 1x20 B      PATIENT EDUCATION: Education details: exercise form and purpose  Person educated: Patient and Spouse Education method: Explanation, Demonstration, Tactile cues, Verbal cues, and Handouts Education comprehension: verbalized understanding   Below measures were taken at time of initial evaluation unless otherwise specified:  DIAGNOSTIC FINDINGS: MRI of brain has been ordered  COGNITION: Overall cognitive  status: Within functional limits for tasks assessed  Cervical ROM:    Active A/PROM (deg) eval  Flexion WFL  Extension WFL  Right lateral flexion   Left lateral flexion   Right rotation WFL  Left rotation WFL  (Blank rows = not tested)  STRENGTH: NT during eval today   BED MOBILITY:  Sit to supine Complete Independence Supine to sit Complete Independence  TRANSFERS: Assistive device utilized: None  Sit to stand: Modified independence Stand to sit: Modified independence  GAIT: Gait pattern:  slowed, cautious, uses cane Distance walked: 50 ft Assistive device utilized: Single point cane Level of assistance: SBA Comments: Reports staggering at times with gait  PATIENT SURVEYS:  FOTO Risk adjusted intake score:  54; predicted score at d/c:  59   VESTIBULAR ASSESSMENT   GENERAL OBSERVATION: Pt jokes with therapist throughout session, hard for patient to describe dizziness without using that word.    SYMPTOM BEHAVIOR:   Subjective history: Reports this has been going on since CVA 2021.  Very careful about taking steps.  Get extremely tired.  Unsteady would be a good word to describe.   Non-Vestibular symptoms:  Hx of CVA   Type of dizziness: Imbalance (Disequilibrium) and Unsteady with head/body turns   Frequency: Occurs daily   Duration: can last all day   Aggravating factors: Induced by position change: rolling to the right and supine to sit, Induced by motion: turning body quickly, turning head quickly, and activity in general, and Worse in the morning   Relieving factors:  sleep it off    Progression of symptoms: unchanged   OCULOMOTOR EXAM:   Ocular Alignment: normal L eye hypometria   Ocular ROM: No Limitations   Spontaneous Nystagmus:  L eye   Gaze-Induced Nystagmus: absent   Smooth Pursuits: intact   Saccades: intact   Convergence/Divergence: 18 cm      VESTIBULAR - OCULAR REFLEX:    Slow VOR: Normal   VOR Cancellation: Corrective  Saccades   Head-Impulse Test: HIT Right: negative HIT Left: negative   Dynamic Visual Acuity: Not able to be assessed   X 1 viewing:  difficult to coordinate, no dizziness; slowed motion   POSITIONAL TESTING: Right Dix-Hallpike: no nystagmus Left Dix-Hallpike: no nystagmus and dizziness upon return to sit (lightheaded) Right Roll Test: no nystagmus Left Roll Test: no nystagmus Other: Lightheaded upon return to sit    MOTION SENSITIVITY:    Motion Sensitivity Quotient  Intensity: 0 = none, 1 = Lightheaded, 2 = Mild, 3 = Moderate, 4 = Severe, 5 = Vomiting  Intensity  1. Sitting to supine   2. Supine to L side   3. Supine to R side   4. Supine to sitting   5. L Hallpike-Dix   6. Up from L    7. R Hallpike-Dix   8. Up from R    9. Sitting, head  tipped to L knee   10. Head up from L  knee   11. Sitting,  head  tipped to R knee   12. Head up from R  knee   13. Sitting head turns x5   14.Sitting head nods x5   15. In stance, 180  turn to L    16. In stance, 180  turn to R      FUNCTIONAL GAIT:  M-CTSIB  Condition 1: Firm Surface, EO 30 Sec, Normal Sway  Condition 2: Firm Surface, EC 30 Sec, Normal Sway  Condition 3: Foam Surface, EO 30 Sec, Mild Sway  Condition 4: Foam Surface, EC 30 Sec, Mild Sway      PATIENT EDUCATION: Education details: PT eval results, POC Person educated: Patient and Spouse Education method: Explanation Education comprehension: verbalized understanding   GOALS: Goals reviewed with patient? Yes  SHORT TERM GOALS: = LTGs   LONG TERM GOALS: Target date: 04/05/2022  Pt will be independent with HEP for improved dizziness and balance. Baseline:  Goal status: IN PROGRESS  2.  Pt will improve FOTO score to at least 59 for improved functional mobility. Baseline:  Goal status: IN PROGRESS  3.  Pt will perform Conditions 3 and 4 on MCTSIB with minimal to normal sway. Baseline:  Goal status: IN PROGRESS  4.  Patient to score 22/24  on DGI in order to demonstrate improved stability.  Baseline: 03/12/22 20/24 Goal status: IN PROGRESS  ASSESSMENT:  CLINICAL IMPRESSION:  Luie arrives today doing OK, we started session working on basic seated horizontal and vertical VOR tasks- still has multiple instances when eyes fall off of target with this activity especially with vertical direction. Did some standing work as well today but needed increased cues. Intermittently inappropriate with male therapist when being guarded for safety with balance tasks.  Finished by working on some balance and hip strength. Will continue to progress as able and tolerated.    OBJECTIVE IMPAIRMENTS Abnormal gait, decreased balance, decreased mobility, and dizziness.   ACTIVITY LIMITATIONS bending, standing, bed mobility, and locomotion level  PARTICIPATION LIMITATIONS: community activity and yard work  PERSONAL FACTORS 3+ comorbidities: see above  are also affecting patient's functional outcome.   REHAB POTENTIAL: Good  CLINICAL DECISION MAKING: Evolving/moderate complexity  EVALUATION COMPLEXITY: Moderate   PLAN: PT FREQUENCY: 2x/week  PT DURATION: 4 weeks  PLANNED INTERVENTIONS: Therapeutic exercises, Therapeutic activity, Neuromuscular re-education, Balance training, Gait training, Patient/Family education, Self Care, Vestibular training, and Canalith repositioning  PLAN FOR NEXT SESSION: progress gaze stabilization, habituation, balance with EC, gait, and stepping over obstacles    Cyril Mourning U PT DPT PN2  03/19/2022, 9:27 AM  [facility] at [facility] [address], [place], [place] [address] Phone # [phone] Fax # [phone]

## 2022-03-21 ENCOUNTER — Ambulatory Visit: Payer: Medicare Other | Admitting: Physical Therapy

## 2022-03-26 ENCOUNTER — Encounter: Payer: Self-pay | Admitting: Internal Medicine

## 2022-03-26 ENCOUNTER — Ambulatory Visit: Payer: Medicare Other

## 2022-03-28 ENCOUNTER — Other Ambulatory Visit: Payer: Self-pay | Admitting: Internal Medicine

## 2022-03-28 ENCOUNTER — Ambulatory Visit: Payer: Medicare Other

## 2022-03-29 ENCOUNTER — Encounter: Payer: Self-pay | Admitting: Physical Therapy

## 2022-03-29 NOTE — Therapy (Signed)
[facility] [address], [place], [place], [address] Phone: [phone]   Fax:  [phone]  Patient Details  Name: Joe Robertson MRN: [id number] Date of Birth: [birth year] Referring Provider:  No ref. provider found  Encounter Date: 03/29/2022   PHYSICAL THERAPY DISCHARGE SUMMARY  Visits from Start of Care: 3  Current functional level related to goals / functional outcomes: See eval   Remaining deficits: See eval   Education / Equipment: Not able to be fully addressed, as pt not returning after 3rd visit.   Patient agrees to discharge. Patient goals were not met. Patient is being discharged due to not returning since the last visit.   Mylia Pondexter W., PT 03/29/2022, 9:54 AM  [facility] [address], [place], [place], [address] Phone: [phone]   Fax:  [phone]

## 2022-04-02 ENCOUNTER — Ambulatory Visit: Payer: Medicare Other | Admitting: Physical Therapy

## 2022-04-04 ENCOUNTER — Ambulatory Visit: Payer: Medicare Other | Admitting: Physical Therapy

## 2022-04-10 ENCOUNTER — Ambulatory Visit (INDEPENDENT_AMBULATORY_CARE_PROVIDER_SITE_OTHER): Payer: Medicare Other | Admitting: Sports Medicine

## 2022-04-10 ENCOUNTER — Ambulatory Visit (INDEPENDENT_AMBULATORY_CARE_PROVIDER_SITE_OTHER): Payer: Medicare Other

## 2022-04-10 ENCOUNTER — Ambulatory Visit (INDEPENDENT_AMBULATORY_CARE_PROVIDER_SITE_OTHER): Payer: Medicare Other | Admitting: Internal Medicine

## 2022-04-10 ENCOUNTER — Encounter: Payer: Self-pay | Admitting: Internal Medicine

## 2022-04-10 VITALS — BP 160/92 | Ht 70.0 in | Wt 214.0 lb

## 2022-04-10 VITALS — BP 160/92 | HR 90 | Temp 98.1°F | Ht 70.0 in | Wt 214.0 lb

## 2022-04-10 DIAGNOSIS — Z043 Encounter for examination and observation following other accident: Secondary | ICD-10-CM | POA: Diagnosis not present

## 2022-04-10 DIAGNOSIS — S4991XA Unspecified injury of right shoulder and upper arm, initial encounter: Secondary | ICD-10-CM

## 2022-04-10 DIAGNOSIS — S42031A Displaced fracture of lateral end of right clavicle, initial encounter for closed fracture: Secondary | ICD-10-CM

## 2022-04-10 DIAGNOSIS — S42021A Displaced fracture of shaft of right clavicle, initial encounter for closed fracture: Secondary | ICD-10-CM | POA: Diagnosis not present

## 2022-04-10 DIAGNOSIS — S42024A Nondisplaced fracture of shaft of right clavicle, initial encounter for closed fracture: Secondary | ICD-10-CM | POA: Diagnosis not present

## 2022-04-10 DIAGNOSIS — S199XXA Unspecified injury of neck, initial encounter: Secondary | ICD-10-CM

## 2022-04-10 MED ORDER — OXYCODONE HCL 5 MG PO TABS
5.0000 mg | ORAL_TABLET | Freq: Four times a day (QID) | ORAL | 0 refills | Status: AC | PRN
Start: 2022-04-10 — End: 2022-04-17

## 2022-04-10 MED ORDER — TRAMADOL HCL 50 MG PO TABS: 50.0000 mg | ORAL_TABLET | Freq: Three times a day (TID) | ORAL | 0 refills | Status: DC | PRN

## 2022-04-10 NOTE — Progress Notes (Signed)
Joe Robertson D.[place] Phone: [phone]   Assessment and Plan:     1. Closed displaced fracture of acromial end of right clavicle, initial encounter -Acute, complicated, initial sports medicine visit - Closed, displaced fracture of distal third of right clavicle from fall earlier this morning - Reviewed x-ray with patient and his wife showing moderately displaced distal one third of clavicle fracture - Patient placed in sling.  Recommend nonweightbearing right upper extremity until reevaluation - Pain management plan: For the next 3 days, alternate between Tylenol 500 mg and ibuprofen 400 mg every 4 hours. After 3 days, use Tylenol 500 mg or ibuprofen 400 mg as needed for pain relief. - May use tramadol 50 mg every 8 hours as needed for severe pain - traMADol (ULTRAM) 50 MG tablet; Take 1 tablet (50 mg total) by mouth every 8 (eight) hours as needed for severe pain.    Pertinent previous records reviewed include right clavicle x-ray 04/10/2022, C-spine x-ray 04/10/2022, right shoulder x-ray 04/10/2022   Follow Up: 1 week for reevaluation.  Would repeat x-ray at that time to ensure no further displacement.   Subjective:   I, Joe Robertson, am serving as a Education administrator for Doctor Glennon Mac  Chief Complaint: broken clavicle   HPI:   04/10/22 Patient is a 81 year old male complaining of right shoulder pain. Patient states he fell this morning and has a broken clavicle.  Denies history of similar injury.  Denies numbness/tingling/weakness in extremity.  Fluctuates between moderate and severe pain.  Is only taken Tylenol so far today.  Relevant Historical Information: Hypertension  Additional pertinent review of systems negative.   Current Outpatient Medications:    traMADol (ULTRAM) 50 MG tablet, Take 1 tablet (50 mg total) by mouth every 8 (eight) hours as needed for severe pain., Disp: 15 tablet,  Rfl: 0   acetaminophen (TYLENOL) 650 MG CR tablet, Take 650 mg by mouth daily as needed., Disp: , Rfl:    aspirin EC 81 MG EC tablet, Take 1 tablet (81 mg total) by mouth daily., Disp:  , Rfl:    losartan (COZAAR) 50 MG tablet, TAKE 1 TABLET(50 MG) BY MOUTH DAILY, Disp: 90 tablet, Rfl: 1   melatonin 5 MG TABS, Take 5 mg by mouth daily., Disp: , Rfl:    oxyCODONE (OXY IR/ROXICODONE) 5 MG immediate release tablet, Take 1 tablet (5 mg total) by mouth every 6 (six) hours as needed for up to 7 days for severe pain., Disp: 65 tablet, Rfl: 0   rosuvastatin (CRESTOR) 5 MG tablet, TAKE 1 TABLET(5 MG) BY MOUTH 4 TIMES A WEEK, Disp: 48 tablet, Rfl: 3   tamsulosin (FLOMAX) 0.4 MG CAPS capsule, Take 1 capsule (0.4 mg total) by mouth daily., Disp: 30 capsule, Rfl: 3   vitamin E 1000 UNIT capsule, Take 1,000 Units by mouth daily with breakfast., Disp: , Rfl:    Objective:     Vitals:   04/10/22 1446  BP: (!) 160/92  Weight: 214 lb (97.1 kg)  Height: '5\' 10"'$  (1.778 m)      Body mass index is 30.71 kg/m.    Physical Exam:    Gen: Appears well, nad, nontoxic and pleasant Neuro:sensation intact, strength is 5/5 with df/pf/inv/ev, muscle tone wnl Skin: no suspicious lesion or defmority Psych: A&O, appropriate mood and affect  Shoulder: no  swelling or muscle wasting.  No skin lesions. No scapular winging ROM in shoulder limited  due to pain neck clavicle TTP clavicle, AC NTTP over the   biceps groove, humerus, deltoid, trapezius, cervical spine   FROM of neck    Electronically signed by:  Joe Robertson D.[facility] 3:14 PM 04/10/22

## 2022-04-10 NOTE — Progress Notes (Signed)
Subjective:  Patient ID: Joe Robertson, male    DOB: May 06, 1941  Age: 81 y.o. MRN: [id number]  CC: Rib Injury   HPI Joe Robertson presents for f/up -  He fell backwards earlier today and now complains of pain over his right clavicle and right shoulder.  He [place] standing [place] the bus stop and does not recall a mechanism for the fall.  He denies neck pain, headache, or paresthesias.  Outpatient Medications Prior to Visit  Medication Sig Dispense Refill   acetaminophen (TYLENOL) 650 MG CR tablet Take 650 mg by mouth daily as needed.     aspirin EC 81 MG EC tablet Take 1 tablet (81 mg total) by mouth daily.     losartan (COZAAR) 50 MG tablet TAKE 1 TABLET(50 MG) BY MOUTH DAILY 90 tablet 1   melatonin 5 MG TABS Take 5 mg by mouth daily.     rosuvastatin (CRESTOR) 5 MG tablet TAKE 1 TABLET(5 MG) BY MOUTH 4 TIMES A WEEK 48 tablet 3   tamsulosin (FLOMAX) 0.4 MG CAPS capsule Take 1 capsule (0.4 mg total) by mouth daily. 30 capsule 3   vitamin E 1000 UNIT capsule Take 1,000 Units by mouth daily with breakfast.     No facility-administered medications prior to visit.    ROS Review of Systems  Constitutional: Negative.   HENT: Negative.    Eyes: Negative.   Respiratory:  Negative for cough and chest tightness.   Cardiovascular:  Negative for chest pain, palpitations and leg swelling.  Gastrointestinal:  Negative for abdominal pain.  Endocrine: Negative.   Genitourinary: Negative.   Musculoskeletal:  Positive for arthralgias. Negative for back pain and neck pain.  Skin: Negative.   Neurological: Negative.  Negative for dizziness, weakness, light-headedness and headaches.  Hematological:  Negative for adenopathy. Does not bruise/bleed easily.  Psychiatric/Behavioral: Negative.      Objective:  BP (!) 160/92 (BP Location: Left Arm, Patient Position: Sitting, Cuff Size: Large)   Pulse 90   Temp 98.1 F (36.7 C) (Oral)   Ht '5\' 10"'$  (1.778 m)   Wt 214 lb (97.1 kg)   SpO2 96%   BMI  30.71 kg/m   BP Readings from Last 3 Encounters:  04/10/22 (!) 160/92  04/10/22 (!) 160/92  03/04/22 130/70    Wt Readings from Last 3 Encounters:  04/10/22 214 lb (97.1 kg)  04/10/22 214 lb (97.1 kg)  03/04/22 210 lb (95.3 kg)    Physical Exam Vitals reviewed.  Constitutional:      Appearance: Normal appearance.  Eyes:     General: No scleral icterus.    Conjunctiva/sclera: Conjunctivae normal.  Cardiovascular:     Rate and Rhythm: Normal rate and regular rhythm.     Heart sounds: No murmur heard. Pulmonary:     Effort: Pulmonary effort is normal.     Breath sounds: No stridor. No wheezing, rhonchi or rales.  Abdominal:     General: Abdomen is flat.     Tenderness: There is no abdominal tenderness.  Musculoskeletal:        General: Normal range of motion.     Right shoulder: Tenderness and bony tenderness present. No swelling or deformity.     Left shoulder: Normal.     Cervical back: Normal, full passive range of motion without pain, normal range of motion and neck supple. No rigidity, tenderness or bony tenderness. No pain with movement. Normal range of motion.     Comments: There is tenderness to palpation and swelling  over the right lateral clavicle.  Lymphadenopathy:     Cervical: No cervical adenopathy.  Skin:    General: Skin is warm and dry.  Neurological:     General: No focal deficit present.     Mental Status: He is alert.     Lab Results  Component Value Date   WBC 9.3 08/21/2020   HGB 15.8 08/21/2020   HCT 47.2 08/21/2020   PLT 267.0 08/21/2020   GLUCOSE 92 03/04/2022   CHOL 158 03/04/2022   TRIG 115.0 03/04/2022   HDL 40.30 03/04/2022   LDLDIRECT 147.0 02/02/2018   LDLCALC 95 03/04/2022   ALT 18 03/04/2022   AST 17 03/04/2022   NA 139 03/04/2022   K 4.5 03/04/2022   CL 104 03/04/2022   CREATININE 1.15 03/04/2022   BUN 17 03/04/2022   CO2 27 03/04/2022   TSH 2.94 02/27/2021   INR 1.0 10/30/2019   HGBA1C 5.8 03/04/2022    MR Abdomen  W Wo Contrast  Result Date: 03/15/2022 CLINICAL DATA:  Suspected biliary ductal dilatation incidentally identified by prior CT EXAM: MRI ABDOMEN WITHOUT AND WITH CONTRAST TECHNIQUE: Multiplanar multisequence MR imaging of the abdomen was performed both before and after the administration of intravenous contrast. CONTRAST:  37m MULTIHANCE GADOBENATE DIMEGLUMINE 529 MG/ML IV SOLN COMPARISON:  CT abdomen pelvis, 01/02/2022 FINDINGS: Lower chest: No acute findings. Marked elevation of the right hemidiaphragm. Small hiatal hernia. Hepatobiliary: No mass or other parenchymal abnormality identified. Status post cholecystectomy. Postoperative biliary ductal dilatation, the central common bile duct measuring up to 1.0 cm. Pancreas: No mass, inflammatory changes, or other parenchymal abnormality identified.No pancreatic ductal dilatation. Spleen:  Within normal limits in size and appearance. Adrenals/Urinary Tract: Normal adrenal glands. Simple, benign renal cortical cyst of the inferior pole of the left kidney, for which no further follow-up or characterization is required. No renal masses or suspicious contrast enhancement identified. No evidence of hydronephrosis. Stomach/Bowel: Pancolonic diverticulosis. Vascular/Lymphatic: No pathologically enlarged lymph nodes identified. No abdominal aortic aneurysm demonstrated. Other:  None. Musculoskeletal: No suspicious osseous lesions identified. IMPRESSION: 1. Status post cholecystectomy. Postoperative biliary ductal dilatation, the central common bile duct measuring up to 1.0 cm. No choledocholithiasis or obstructing mass. 2. Marked elevation of the right hemidiaphragm. Electronically Signed   By: ADelanna AhmadiM.D.   On: 03/15/2022 10:39    DG Shoulder Right  Result Date: 04/10/2022 CLINICAL DATA:  Fall EXAM: RIGHT SHOULDER - 2+ VIEW COMPARISON:  None Available. FINDINGS: Displaced fracture distal right clavicle. Rotator cuff impingement.  Shoulder joint negative.  IMPRESSION: Displaced fracture distal right clavicle Electronically Signed   By: CFranchot GalloM.D.   On: 04/10/2022 13:56   DG Clavicle Right  Result Date: 04/10/2022 CLINICAL DATA:  Fall EXAM: RIGHT CLAVICLE - 2+ VIEWS COMPARISON:  None Available. FINDINGS: Fracture distal right clavicle with moderate displacement. It is difficult to determine if the fracture extends to the [facility]. Rotator cuff impingement.  No other fracture. IMPRESSION: Moderately displaced fracture distal right clavicle Electronically Signed   By: CFranchot GalloM.D.   On: 04/10/2022 13:55   DG Cervical Spine Complete  Result Date: 04/10/2022 CLINICAL DATA:  Fall. EXAM: CERVICAL SPINE - COMPLETE 4+ VIEW COMPARISON:  None Available. FINDINGS: Mild-to-moderate levoscoliosis.  Normal sagittal alignment. 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 Bilateral foraminal encroachment due to spurring No fracture identified. IMPRESSION: Cervical spondylosis.  Negative for fracture. Given the scoliosis and spondylosis, consider CT cervical spine if there is continued clinical concern of  acute injury. Electronically Signed   By: Franchot Gallo M.D.   On: 04/10/2022 13:53     Assessment & Plan:   Osmar was seen today for rib injury.  Diagnoses and all orders for this visit:  Injury of neck, initial encounter- There is no evidence of cervical spine fracture. -     DG Cervical Spine Complete; Future  Injury of clavicle, right, initial encounter -     DG Clavicle Right; Future  Right shoulder injury, initial encounter -     DG Shoulder Right; Future  Closed nondisplaced fracture of shaft of right clavicle, initial encounter- I recommended he sling his right upper extremity and take oxycodone as needed for pain. -     oxyCODONE (OXY IR/ROXICODONE) 5 MG immediate release tablet; Take 1 tablet (5 mg total) by mouth every 6 (six) hours as needed for up to 7 days for severe pain.   I am having Joe Robertson start  on oxyCODONE. I am also having him maintain his acetaminophen, vitamin E, aspirin EC, tamsulosin, melatonin, losartan, and rosuvastatin.  Meds ordered this encounter  Medications   oxyCODONE (OXY IR/ROXICODONE) 5 MG immediate release tablet    Sig: Take 1 tablet (5 mg total) by mouth every 6 (six) hours as needed for up to 7 days for severe pain.    Dispense:  65 tablet    Refill:  0     Follow-up: Return if symptoms worsen or fail to improve.  Scarlette Calico, MD

## 2022-04-10 NOTE — Patient Instructions (Signed)
Clavicle Fracture  A clavicle fracture is a break in the long bone that connects the shoulder to the chest wall (clavicle). The clavicle is also called the collarbone. A clavicle fracture is a common injury that can happen at any age. What are the causes? Common causes of a clavicle fracture include: A hard, direct hit to the shoulder. A motor vehicle accident. A fall, especially if you try to break your fall with an outstretched arm. What increases the risk? You are more likely to develop this condition if you: Are younger than 53 years of age or older than 80 years of age. Most clavicle fractures happen to people who are younger than 80 years of age. Are male. Play contact sports. What are the signs or symptoms? Symptoms of this condition include: Pain near your injured clavicle and in the shoulder or arm on your injured side. Difficulty moving the arm on your injured side. The shoulder on your injured side dropping downward and forward. Pain when trying to lift the shoulder on your injured side. Bruising, swelling, and tenderness over your injured clavicle. A grinding noise when you try to move the shoulder on your injured side. A bump over your injured clavicle. How is this diagnosed? This condition is diagnosed based on: Your medical history. A physical exam. X-rays to check the position of your injured clavicle. How is this treated? Treatment for this condition depends on the position of your clavicle after the fracture: If the broken ends of the bone are not out of place, your health care provider may put your arm in a sling. If the broken ends of the bone are out of place, you may need surgery. This may involve placing screws, pins, or plates to keep your clavicle stable while it heals. Your health care provider may recommend over-the-counter or prescription pain medicines. When your health care provider thinks your fracture has healed enough, you may need to do physical  therapy. This will help you regain normal movement and build up arm strength. Follow these instructions at home: Medicines Ask your health care provider if the medicine prescribed to you: Requires you to avoid driving or using machinery. Can cause constipation. You may need to take these actions to prevent or treat constipation: Drink enough fluid to keep your urine pale yellow. Take over-the-counter or prescription medicines. Eat foods that are high in fiber, such as beans, whole grains, and fresh fruits and vegetables. Limit foods that are high in fat and processed sugars, such as fried or sweet foods. If you have a sling: Wear it as told by your health care provider. Remove it only as told by your health care provider. Loosen it if your fingers tingle, become numb, or turn cold and blue. Do not lift your arm. Keep it across your chest. Keep it clean. Ask your health care provider if you may remove the sling for bathing. If the sling is not waterproof, do not let it get wet. Cover it with a watertight covering if you take a bath or a shower while wearing it. If you may remove your sling when you take a bath or a shower, keep your shoulder in the same position as when the sling is on. Managing pain, stiffness, and swelling  If directed, put ice on the injured area. To do this: If you have a removable sling, remove it as told by your health care provider. Put ice in a plastic bag. Place a towel between your skin and the bag.  Leave the ice on for 20 minutes, 2-3 times a day. Move your fingers often to reduce stiffness and swelling. Activity  Avoid activities that make your symptoms worse for 4-6 weeks, or as long as directed. Do not lift anything that is heavier than 10 lb (4.5 kg), or the limit that you are told, until your health care provider says that it is safe. Do not put weight through your arm on the injured side, such as pushing your arm down, until your health care provider  says that it is safe. Ask your health care provider when it is safe for you to drive. Do exercises as told by your health care provider. Return to your normal activities as told by your health care provider. Ask your health care provider what activities are safe for you. General instructions Do not use any products that contain nicotine or tobacco, such as cigarettes, e-cigarettes, and chewing tobacco. These can delay bone healing. If you need help quitting, ask your health care provider. Take over-the-counter and prescription medicines only as told by your health care provider. Keep all follow-up visits as told by your health care provider. This is important. Contact a health care provider if: Your medicine is not helping to relieve pain and swelling. Get help right away if: Your arm on the injured side is numb, cold, or pale, even when your sling is loose. Summary The clavicle, also called the collarbone, is the long bone that connects the shoulder to the chest wall. Treatment for this condition depends on the position of your clavicle after the fracture. You may need to do physical therapy after your injury has healed enough. If you have a sling, wear it as told by your health care provider. This information is not intended to replace advice given to you by your health care provider. Make sure you discuss any questions you have with your health care provider. Document Revised: 07/21/2019 Document Reviewed: 06/18/2019 Elsevier Patient Education  [place].

## 2022-04-10 NOTE — Patient Instructions (Addendum)
Good to see you   You have a broken clavicle. I recommend wearing the sling at all times including in bed at night. Nonweightbearing with right arm.  Do not pick up anything with right arm.  Pain management plan: For the next 3 days, alternate between Tylenol 500 mg and ibuprofen 400 mg every 4 hours. After 3 days, use Tylenol 500 mg or ibuprofen 400 mg as needed for pain relief. - May use tramadol 50 mg every 8 hours as needed for severe pain  1 week follow up and repeat xray

## 2022-04-11 ENCOUNTER — Encounter: Payer: Self-pay | Admitting: Internal Medicine

## 2022-04-11 ENCOUNTER — Telehealth: Payer: Self-pay | Admitting: Sports Medicine

## 2022-04-11 NOTE — Telephone Encounter (Signed)
Patient's wife called stating that the pharmacy is giving them trouble about filling the tramadol.  She was told that they were waiting on approval from the insurance company.  Do we have any prior auths for him?

## 2022-04-12 NOTE — Telephone Encounter (Signed)
I never received a PA for this but I have started one through cover my meds and I am currently awaiting the insurance response

## 2022-04-16 NOTE — Progress Notes (Unsigned)
    Joe Robertson D.[place] Phone: [phone]   Assessment and Plan:     There are no diagnoses linked to this encounter.  ***   Pertinent previous records reviewed include ***   Follow Up: ***     Subjective:   I, Joe Robertson, am serving as a Education administrator for Doctor Glennon Mac   Chief Complaint: broken clavicle    HPI:    04/10/22 Patient is a 81 year old male complaining of right shoulder pain. Patient states he fell this morning and has a broken clavicle.  Denies history of similar injury.  Denies numbness/tingling/weakness in extremity.  Fluctuates between moderate and severe pain.  Is only taken Tylenol so far today.  04/17/2022 Patient states    Relevant Historical Information: Hypertension  Additional pertinent review of systems negative.   Current Outpatient Medications:    acetaminophen (TYLENOL) 650 MG CR tablet, Take 650 mg by mouth daily as needed., Disp: , Rfl:    aspirin EC 81 MG EC tablet, Take 1 tablet (81 mg total) by mouth daily., Disp:  , Rfl:    losartan (COZAAR) 50 MG tablet, TAKE 1 TABLET(50 MG) BY MOUTH DAILY, Disp: 90 tablet, Rfl: 1   melatonin 5 MG TABS, Take 5 mg by mouth daily., Disp: , Rfl:    oxyCODONE (OXY IR/ROXICODONE) 5 MG immediate release tablet, Take 1 tablet (5 mg total) by mouth every 6 (six) hours as needed for up to 7 days for severe pain., Disp: 65 tablet, Rfl: 0   rosuvastatin (CRESTOR) 5 MG tablet, TAKE 1 TABLET(5 MG) BY MOUTH 4 TIMES A WEEK, Disp: 48 tablet, Rfl: 3   tamsulosin (FLOMAX) 0.4 MG CAPS capsule, Take 1 capsule (0.4 mg total) by mouth daily., Disp: 30 capsule, Rfl: 3   traMADol (ULTRAM) 50 MG tablet, Take 1 tablet (50 mg total) by mouth every 8 (eight) hours as needed for severe pain., Disp: 15 tablet, Rfl: 0   vitamin E 1000 UNIT capsule, Take 1,000 Units by mouth daily with breakfast., Disp: , Rfl:    Objective:     There were no  vitals filed for this visit.    There is no height or weight on file to calculate BMI.    Physical Exam:    ***   Electronically signed by:  Joe Robertson D.[facility] 7:43 AM 04/16/22

## 2022-04-16 NOTE — Telephone Encounter (Signed)
Received a fax back and insurance does not required a PA as it is with in quantity limits

## 2022-04-17 ENCOUNTER — Ambulatory Visit (INDEPENDENT_AMBULATORY_CARE_PROVIDER_SITE_OTHER): Payer: Medicare Other

## 2022-04-17 ENCOUNTER — Ambulatory Visit (INDEPENDENT_AMBULATORY_CARE_PROVIDER_SITE_OTHER): Payer: Medicare Other | Admitting: Sports Medicine

## 2022-04-17 VITALS — BP 130/72 | HR 95 | Ht 70.0 in | Wt 214.0 lb

## 2022-04-17 DIAGNOSIS — M898X1 Other specified disorders of bone, shoulder: Secondary | ICD-10-CM

## 2022-04-17 DIAGNOSIS — M79601 Pain in right arm: Secondary | ICD-10-CM | POA: Diagnosis not present

## 2022-04-17 DIAGNOSIS — S42031D Displaced fracture of lateral end of right clavicle, subsequent encounter for fracture with routine healing: Secondary | ICD-10-CM

## 2022-04-17 MED ORDER — TRAMADOL HCL 50 MG PO TABS: 50.0000 mg | ORAL_TABLET | Freq: Three times a day (TID) | ORAL | 0 refills | Status: AC | PRN

## 2022-04-17 NOTE — Patient Instructions (Addendum)
Good to see you  Tramadol refill will be available on Friday  2-3 week follow up

## 2022-04-18 DIAGNOSIS — C44311 Basal cell carcinoma of skin of nose: Secondary | ICD-10-CM | POA: Diagnosis not present

## 2022-04-18 DIAGNOSIS — C4491 Basal cell carcinoma of skin, unspecified: Secondary | ICD-10-CM | POA: Diagnosis not present

## 2022-04-19 ENCOUNTER — Ambulatory Visit: Payer: Medicare Other

## 2022-04-24 ENCOUNTER — Ambulatory Visit (INDEPENDENT_AMBULATORY_CARE_PROVIDER_SITE_OTHER): Payer: Medicare Other

## 2022-04-24 DIAGNOSIS — Z0000 Encounter for general adult medical examination without abnormal findings: Secondary | ICD-10-CM | POA: Diagnosis not present

## 2022-04-24 NOTE — Progress Notes (Signed)
Subjective:   Joe Robertson is a 81 y.o. male who presents for Medicare Annual/Subsequent preventive examination.  Review of Systems    Virtual Visit via Telephone Note  I connected with  Joe Robertson on 04/24/22 at  8:45 AM EDT by telephone and verified that I am speaking with the correct person using two identifiers.  Location: Patient: home Provider: Melfa Persons participating in the virtual visit: [place]   I discussed the limitations, risks, security and privacy concerns of performing an evaluation and management service by telephone and the availability of in person appointments. The patient expressed understanding and agreed to proceed.  Interactive audio and video telecommunications were attempted between this nurse and patient, however failed, due to patient having technical difficulties OR patient did not have access to video capability.  We continued and completed visit with audio only.  Some vital signs may be absent or patient reported.   Sheral Flow, LPN  Cardiac Risk Factors include: advanced age (>23mn, >>66women);dyslipidemia;family history of premature cardiovascular disease;hypertension;male gender;obesity (BMI >30kg/m2)     Objective:    There were no vitals filed for this visit. There is no height or weight on file to calculate BMI.     04/24/2022    8:50 AM 03/05/2022    3:35 PM 04/18/2021    9:40 AM 11/10/2019    6:42 PM 10/30/2019    9:20 PM 01/14/2017   10:10 AM  Advanced Directives  Does Patient Have a Medical Advance Directive? Yes Yes Yes No No No  Type of [place] will [place] will Living will;Healthcare Power of Attorney     Does patient want to make changes to medical advance directive?  No - Patient declined      Copy of [place] Chart? No - copy requested  No - copy requested     Would patient like  information on creating a medical advance directive?    No - Patient declined No - Patient declined Yes (MAU/Ambulatory/Procedural Areas - Information given)    Current Medications (verified) Outpatient Encounter Medications as of 04/24/2022  Medication Sig   acetaminophen (TYLENOL) 650 MG CR tablet Take 650 mg by mouth daily as needed.   aspirin EC 81 MG EC tablet Take 1 tablet (81 mg total) by mouth daily.   losartan (COZAAR) 50 MG tablet TAKE 1 TABLET(50 MG) BY MOUTH DAILY   melatonin 5 MG TABS Take 5 mg by mouth daily.   rosuvastatin (CRESTOR) 5 MG tablet TAKE 1 TABLET(5 MG) BY MOUTH 4 TIMES A WEEK   tamsulosin (FLOMAX) 0.4 MG CAPS capsule Take 1 capsule (0.4 mg total) by mouth daily.   vitamin E 1000 UNIT capsule Take 1,000 Units by mouth daily with breakfast.   No facility-administered encounter medications on file as of 04/24/2022.    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   Left Upper Arm - Anterior (in situ)   [place]   Past Surgical History:  Procedure Laterality Date   CHOLECYSTECTOMY     INGUINAL HERNIA REPAIR Right 01/24/2017   Procedure: LAPAROSCOPIC RIGHT INGUINAL HERNIA WITH MESH;  Surgeon: Ralene Ok, MD;  Location: [facility];  Service: General;  Laterality: Right;   INSERTION OF MESH Right 01/24/2017   Procedure: INSERTION OF MESH;  Surgeon: Ralene Ok, MD;  Location: [facility];  Service: General;  Laterality: Right;   Family History  Problem Relation Age of Onset   Arthritis Mother    Heart disease  Father    Arthritis Father    Diabetes Maternal Aunt    Cancer Paternal Uncle        lung   Stroke Maternal Grandmother    Diabetes Maternal Grandmother    Social History   Socioeconomic History   Marital status: Married    Spouse name: Romie Minus   Number of children: Not on file   Years of education: Not on file   Highest education level: Professional school degree (e.g., MD, DDS, DVM, JD)  Occupational History    Comment: retired Geophysicist/field seismologist  Tobacco Use   Smoking status: Former   Smokeless tobacco: Never   Tobacco comments:    as teenager  Substance and Sexual Activity   Alcohol use: No   Drug use: No   Sexual activity: Not on file  Other Topics Concern   Not on file  Social History Narrative   Not on file   Social Determinants of Health   Financial Resource Strain: Low Risk  (04/24/2022)   Overall Financial Resource Strain (CARDIA)    Difficulty of Paying Living Expenses: Not hard at all  Food Insecurity: No Food Insecurity (04/24/2022)   Hunger Vital Sign    Worried About Running Out of Food in the Last Year: Never true    [place] in the Last Year: Never true  Transportation Needs: No Transportation Needs (04/24/2022)   PRAPARE - Hydrologist (Medical): No    Lack of Transportation (Non-Medical): No  Physical Activity: Sufficiently Active (04/24/2022)   Exercise Vital Sign    Days of Exercise per Week: 5 days    Minutes of Exercise per Session: 30 min  Stress: No Stress Concern Present (04/24/2022)   [place]    Feeling of Stress : Not at all  Social Connections: [place] (04/24/2022)   Social Connection and Isolation Panel [NHANES]    Frequency of Communication with Friends and Family: More than three times a week    Frequency of Social Gatherings with Friends and Family: More than three times a week    Attends Religious Services: More than 4 times per  year    Active Member of [employer] or Organizations: Yes    Attends Music therapist: More than 4 times per year    Marital Status: Married    Tobacco Counseling Counseling given: Not Answered Tobacco comments: as teenager   Clinical Intake:  Pre-visit preparation completed: Yes  Pain : No/denies pain     BMI - recorded: 30.71 (04/10/2022) Nutritional Status: BMI > 30  Obese Nutritional Risks: None Diabetes: No  How often do you need to have someone help you when you read instructions, pamphlets, or other written materials from your doctor or pharmacy?: 1 - Never What is the last grade level you completed in school?: Doctorate Degree  Diabetic? no  Interpreter Needed?: No  Information entered by :: Lisette Abu, LPN.  Activities of Daily Living    04/24/2022    9:03 AM  In your present state of health, do you have any difficulty performing the following activities:  Hearing? 1  Vision? 0  Difficulty concentrating or making decisions? 0  Walking or climbing stairs? 1  Comment uses a cane  Dressing or bathing? 0  Doing errands, shopping? 0  Preparing Food and eating ? N  Using the Toilet? N  In the past six months, have you accidently leaked urine? N  Do you have problems with loss of bowel control? N  Managing your Medications? N  Managing your Finances? N  Housekeeping or managing your Housekeeping? N    Patient Care Team: Binnie Rail, MD as PCP - General (Internal Medicine) Janina Mayo, MD as PCP - Cardiology (Cardiology) Alanda Slim Neena Rhymes, MD as Consulting Physician (Ophthalmology) Lavonna Monarch, MD as Consulting Physician (Dermatology)  Indicate any recent Medical Services you may have received from other than [facility] providers in the past year (date may be approximate).     Assessment:   This is a routine wellness examination for Joe Robertson.  Hearing/Vision screen Hearing Screening - Comments:: Has hearing difficulties and wears  hearing aids.  Vision Screening - Comments:: Wears rx glasses - up to date with routine eye exams with: Julian Reil, MD.   Dietary issues and exercise activities discussed: Current Exercise Habits: Home exercise routine, Type of exercise: walking, Time (Minutes): 30, Frequency (Times/Week): 5, Weekly Exercise (Minutes/Week): 150, Exercise limited by: neurologic condition(s) (history of TIA)   Goals Addressed             This Visit's Progress    My goal is to improve after having the stroke.        Depression Screen    04/24/2022    8:56 AM 03/04/2022    8:57 AM 01/03/2022    2:55 PM 01/02/2022    1:08 PM 04/18/2021    9:32 AM 02/21/2021    8:33 AM 12/02/2019   10:03 AM  PHQ 2/9 Scores  PHQ - 2 Score 0 0 0 0 0 0 0  PHQ- 9 Score 0 0 0 0       Fall Risk    04/24/2022    8:52 AM 03/04/2022    8:58 AM 01/03/2022    2:55 PM 01/02/2022    1:08 PM 04/18/2021    9:27 AM  Fall Risk   Falls in the past year? 1 1 0 0 0  Number falls in past yr: 1 0 0 0 0  Injury with Fall? 1 0 0 0 0  Risk for fall due to : History of fall(s);Impaired balance/gait;Orthopedic patient No Fall Risks No Fall Risks No Fall Risks Impaired balance/gait  Follow up Education provided;Falls prevention discussed Falls evaluation completed Falls evaluation completed Falls evaluation completed Falls evaluation completed    FALL RISK PREVENTION PERTAINING TO THE HOME:  Any stairs in or around the home? No  If so, are there any without handrails? No  Home free of loose throw rugs in walkways, pet beds, electrical cords, etc? Yes  Adequate lighting in your home to reduce risk of falls? Yes   ASSISTIVE DEVICES UTILIZED TO PREVENT FALLS:  Life alert? No  Use of a cane, walker or w/c? Yes  Grab bars in the bathroom? Yes  Shower chair or bench in shower? Yes  Elevated toilet seat or a handicapped toilet? Yes   TIMED UP AND GO:  Was the test  performed? No .  Length of time to ambulate 10 feet: n/a sec.    Appearance of gait: Gait not evaluated during this visit.  Cognitive Function:        04/24/2022    9:02 AM  6CIT Screen  What Year? 0 points  What month? 0 points  What time? 0 points  Count back from 20 0 points  Months in reverse 0 points  Repeat phrase 0 points  Total Score 0 points    Immunizations Immunization History  Administered Date(s) Administered   Fluad Quad(high Dose 65+) 04/12/2019, 06/01/2020, 05/14/2021   Influenza Split 06/13/2014   Influenza, High Dose Seasonal PF 05/21/2015, 05/20/2017, 05/18/2018   Influenza-Unspecified 06/02/2016   Moderna Sars-Covid-2 Vaccination 10/15/2019, 11/12/2019, 08/24/2020   Pneumococcal Conjugate-13 05/20/2015   Pneumococcal Polysaccharide-23 07/03/2016   Tdap 05/18/2018    TDAP status: Up to date  Flu Vaccine status: Due, Education has been provided regarding the importance of this vaccine. Advised may receive this vaccine at local pharmacy or Health Dept. Aware to provide a copy of the vaccination record if obtained from local pharmacy or Health Dept. Verbalized acceptance and understanding.  Pneumococcal vaccine status: Up to date  Covid-19 vaccine status: Completed vaccines  Qualifies for Shingles Vaccine? Yes   Zostavax completed No   Shingrix Completed?: No.    Education has been provided regarding the importance of this vaccine. Patient has been advised to call insurance company to determine out of pocket expense if they have not yet received this vaccine. Advised may also receive vaccine at local pharmacy or Health Dept. Verbalized acceptance and understanding.  Screening Tests Health Maintenance  Topic Date Due   Zoster Vaccines- Shingrix (1 of 2) Never done   COVID-19 Vaccine (4 - Moderna risk series) 10/19/2020   INFLUENZA VACCINE  03/19/2022   Hepatitis C Screening  08/21/2053 (Originally 05/26/1959)   TETANUS/TDAP  05/18/2028   Pneumonia Vaccine 69+ Years old  Completed   HPV VACCINES  Aged Out     Health Maintenance  Health Maintenance Due  Topic Date Due   Zoster Vaccines- Shingrix (1 of 2) Never done   COVID-19 Vaccine (4 - Moderna risk series) 10/19/2020   INFLUENZA VACCINE  03/19/2022    Colorectal cancer screening: No longer required.   Lung Cancer Screening: (Low Dose CT Chest recommended if Age 1-80 years, 30 pack-year currently smoking OR have quit w/in 15years.) does not qualify.   Lung Cancer Screening Referral: no  Additional Screening:  Hepatitis C Screening: does qualify; Completed no  Vision Screening: Recommended annual ophthalmology exams for early detection of glaucoma and other disorders of the eye. Is the patient up to date with their annual eye exam?  Yes  Who is the provider or what is the name of the office in which the patient attends annual eye exams? Julian Reil, MD. If pt is not established with a provider, would they like to be referred to a provider to establish care? No .   Dental Screening: Recommended annual dental exams for proper oral hygiene  Community Resource Referral / Chronic Care Management: CRR required this visit?  No   CCM required this visit?  No      Plan:     I have personally reviewed and noted the following in the patient's chart:   Medical and social history Use of alcohol, tobacco or illicit drugs  Current medications and supplements including opioid prescriptions. Patient is not currently taking opioid prescriptions. Functional ability and status Nutritional  status Physical activity Advanced directives List of other physicians Hospitalizations, surgeries, and ER visits in previous 12 months Vitals Screenings to include cognitive, depression, and falls Referrals and appointments  In addition, I have reviewed and discussed with patient certain preventive protocols, quality metrics, and best practice recommendations. A written personalized care plan for preventive services as well as general preventive  health recommendations were provided to patient.     Sheral Flow, LPN   12/23/4732   Nurse Notes:  There were no vitals filed for this visit. There is no height or weight on file. BMI used from 04/10/22 date of service.

## 2022-04-24 NOTE — Patient Instructions (Addendum)
[name] , Thank you for taking time to come for your Medicare Wellness Visit. I appreciate your ongoing commitment to your health goals. Please review the following plan we discussed and let me know if I can assist you in the future.   Screening recommendations/referrals: Colonoscopy: Discontinued due to age. Recommended yearly ophthalmology/optometry visit for glaucoma screening and checkup Recommended yearly dental visit for hygiene and checkup  Vaccinations: Influenza vaccine: 05/14/2021; due Fall 2023 Pneumococcal vaccine: 05/20/2015, 07/03/2016 Tdap vaccine: 05/18/2018; due very 10 years Shingles vaccine: never done; recommend Shingrix which is 2 doses 2-6 months apart and over 90% effective    Covid-19: 10/15/2019, 11/12/2019, 08/24/2020  Advanced directives: Yes; Please bring a copy of your health care power of attorney and living will to the office at your convenience.  Conditions/risks identified: Yes; History of falls.  Next appointment: 04/28/2023 at 8:45 a.m. telephone visit with Mignon Pine, Nurse Health Advisor.  If you need to reschedule or cancel, please call [phone].  Preventive Care 80 Years and Older, Male Preventive care refers to lifestyle choices and visits with your health care provider that can promote health and wellness. What does preventive care include? A yearly physical exam. This is also called an annual well check. Dental exams once or twice a year. Routine eye exams. Ask your health care provider how often you should have your eyes checked. Personal lifestyle choices, including: Daily care of your teeth and gums. Regular physical activity. Eating a healthy diet. Avoiding tobacco and drug use. Limiting alcohol use. Practicing safe sex. Taking low doses of aspirin every day. Taking vitamin and mineral supplements as recommended by your health care provider. What happens during an annual well check? The services and screenings done by your health care  provider during your annual well check will depend on your age, overall health, lifestyle risk factors, and family history of disease. Counseling  Your health care provider may ask you questions about your: Alcohol use. Tobacco use. Drug use. Emotional well-being. Home and relationship well-being. Sexual activity. Eating habits. History of falls. Memory and ability to understand (cognition). Work and work Statistician. Screening  You may have the following tests or measurements: Height, weight, and BMI. Blood pressure. Lipid and cholesterol levels. These may be checked every 5 years, or more frequently if you are over 32 years old. Skin check. Lung cancer screening. You may have this screening every year starting at age 41 if you have a 30-pack-year history of smoking and currently smoke or have quit within the past 15 years. Fecal occult blood test (FOBT) of the stool. You may have this test every year starting at age 65. Flexible sigmoidoscopy or colonoscopy. You may have a sigmoidoscopy every 5 years or a colonoscopy every 10 years starting at age 63. Prostate cancer screening. Recommendations will vary depending on your family history and other risks. Hepatitis C blood test. Hepatitis B blood test. Sexually transmitted disease (STD) testing. Diabetes screening. This is done by checking your blood sugar (glucose) after you have not eaten for a while (fasting). You may have this done every 1-3 years. Abdominal aortic aneurysm (AAA) screening. You may need this if you are a current or former smoker. Osteoporosis. You may be screened starting at age 52 if you are at high risk. Talk with your health care provider about your test results, treatment options, and if necessary, the need for more tests. Vaccines  Your health care provider may recommend certain vaccines, such as: Influenza vaccine. This is recommended every  year. Tetanus, diphtheria, and acellular pertussis (Tdap, Td)  vaccine. You may need a Td booster every 10 years. Zoster vaccine. You may need this after age 26. Pneumococcal 13-valent conjugate (PCV13) vaccine. One dose is recommended after age 70. Pneumococcal polysaccharide (PPSV23) vaccine. One dose is recommended after age 47. Talk to your health care provider about which screenings and vaccines you need and how often you need them. This information is not intended to replace advice given to you by your health care provider. Make sure you discuss any questions you have with your health care provider. Document Released: 09/01/2015 Document Revised: 04/24/2016 Document Reviewed: 06/06/2015 Elsevier Interactive Patient Education  2017 [place] Prevention in the Home Falls can cause injuries. They can happen to people of all ages. There are many things you can do to make your home safe and to help prevent falls. What can I do on the outside of my home? Regularly fix the edges of walkways and driveways and fix any cracks. Remove anything that might make you trip as you walk through a door, such as a raised step or threshold. Trim any bushes or trees on the path to your home. Use bright outdoor lighting. Clear any walking paths of anything that might make someone trip, such as rocks or tools. Regularly check to see if handrails are loose or broken. Make sure that both sides of any steps have handrails. Any raised decks and porches should have guardrails on the edges. Have any leaves, snow, or ice cleared regularly. Use sand or salt on walking paths during winter. Clean up any spills in your garage right away. This includes oil or grease spills. What can I do in the bathroom? Use night lights. Install grab bars by the toilet and in the tub and shower. Do not use towel bars as grab bars. Use non-skid mats or decals in the tub or shower. If you need to sit down in the shower, use a plastic, non-slip stool. Keep the floor dry. Clean up any  water that spills on the floor as soon as it happens. Remove soap buildup in the tub or shower regularly. Attach bath mats securely with double-sided non-slip rug tape. Do not have throw rugs and other things on the floor that can make you trip. What can I do in the bedroom? Use night lights. Make sure that you have a light by your bed that is easy to reach. Do not use any sheets or blankets that are too big for your bed. They should not hang down onto the floor. Have a firm chair that has side arms. You can use this for support while you get dressed. Do not have throw rugs and other things on the floor that can make you trip. What can I do in the kitchen? Clean up any spills right away. Avoid walking on wet floors. Keep items that you use a lot in easy-to-reach places. If you need to reach something above you, use a strong step stool that has a grab bar. Keep electrical cords out of the way. Do not use floor polish or wax that makes floors slippery. If you must use wax, use non-skid floor wax. Do not have throw rugs and other things on the floor that can make you trip. What can I do with my stairs? Do not leave any items on the stairs. Make sure that there are handrails on both sides of the stairs and use them. Fix handrails that are broken or  loose. Make sure that handrails are as long as the stairways. Check any carpeting to make sure that it is firmly attached to the stairs. Fix any carpet that is loose or worn. Avoid having throw rugs at the top or bottom of the stairs. If you do have throw rugs, attach them to the floor with carpet tape. Make sure that you have a light switch at the top of the stairs and the bottom of the stairs. If you do not have them, ask someone to add them for you. What else can I do to help prevent falls? Wear shoes that: Do not have high heels. Have rubber bottoms. Are comfortable and fit you well. Are closed at the toe. Do not wear sandals. If you use a  stepladder: Make sure that it is fully opened. Do not climb a closed stepladder. Make sure that both sides of the stepladder are locked into place. Ask someone to hold it for you, if possible. Clearly mark and make sure that you can see: Any grab bars or handrails. First and last steps. Where the edge of each step is. Use tools that help you move around (mobility aids) if they are needed. These include: Canes. Walkers. Scooters. Crutches. Turn on the lights when you go into a dark area. Replace any light bulbs as soon as they burn out. Set up your furniture so you have a clear path. Avoid moving your furniture around. If any of your floors are uneven, fix them. If there are any pets around you, be aware of where they are. Review your medicines with your doctor. Some medicines can make you feel dizzy. This can increase your chance of falling. Ask your doctor what other things that you can do to help prevent falls. This information is not intended to replace advice given to you by your health care provider. Make sure you discuss any questions you have with your health care provider. Document Released: 06/01/2009 Document Revised: 01/11/2016 Document Reviewed: 09/09/2014 Elsevier Interactive Patient Education  2017 [employer].

## 2022-04-30 NOTE — Progress Notes (Deleted)
    Joe Robertson D.[place] Phone: [phone]   Assessment and Plan:     There are no diagnoses linked to this encounter.  ***   Pertinent previous records reviewed include ***   Follow Up: ***     Subjective:   I, Joe Robertson, am serving as a Education administrator for Doctor Glennon Mac   Chief Complaint: broken clavicle    HPI:    04/10/22 Patient is a 81 year old male complaining of right shoulder pain. Patient states he fell this morning and has a broken clavicle.  Denies history of similar injury.  Denies numbness/tingling/weakness in extremity.  Fluctuates between moderate and severe pain.  Is only taken Tylenol so far today.   04/17/2022 Patient states he has had no success with the sling , pain meds didn't help much , hasn't been able to sleep   05/01/2022 Patient states     Relevant Historical Information: Hypertension  Additional pertinent review of systems negative.   Current Outpatient Medications:    acetaminophen (TYLENOL) 650 MG CR tablet, Take 650 mg by mouth daily as needed., Disp: , Rfl:    aspirin EC 81 MG EC tablet, Take 1 tablet (81 mg total) by mouth daily., Disp:  , Rfl:    losartan (COZAAR) 50 MG tablet, TAKE 1 TABLET(50 MG) BY MOUTH DAILY, Disp: 90 tablet, Rfl: 1   melatonin 5 MG TABS, Take 5 mg by mouth daily., Disp: , Rfl:    rosuvastatin (CRESTOR) 5 MG tablet, TAKE 1 TABLET(5 MG) BY MOUTH 4 TIMES A WEEK, Disp: 48 tablet, Rfl: 3   tamsulosin (FLOMAX) 0.4 MG CAPS capsule, Take 1 capsule (0.4 mg total) by mouth daily., Disp: 30 capsule, Rfl: 3   vitamin E 1000 UNIT capsule, Take 1,000 Units by mouth daily with breakfast., Disp: , Rfl:    Objective:     There were no vitals filed for this visit.    There is no height or weight on file to calculate BMI.    Physical Exam:    ***   Electronically signed by:  Joe Robertson D.[facility] 7:58 AM  04/30/22

## 2022-05-01 ENCOUNTER — Ambulatory Visit: Payer: Medicare Other | Admitting: Sports Medicine

## 2022-05-02 NOTE — Progress Notes (Unsigned)
    Joe Robertson D.[place] Phone: [phone]   Assessment and Plan:     There are no diagnoses linked to this encounter.  ***   Pertinent previous records reviewed include ***   Follow Up: ***     Subjective:   I, Joe Robertson, am serving as a Education administrator for Doctor Glennon Mac   Chief Complaint: broken clavicle    HPI:    04/10/22 Patient is a 81 year old male complaining of right shoulder pain. Patient states he fell this morning and has a broken clavicle.  Denies history of similar injury.  Denies numbness/tingling/weakness in extremity.  Fluctuates between moderate and severe pain.  Is only taken Tylenol so far today.   04/17/2022 Patient states he has had no success with the sling , pain meds didn't help much , hasn't been able to sleep    05/06/2022 Patient states    Relevant Historical Information: Hypertension  Additional pertinent review of systems negative.   Current Outpatient Medications:    acetaminophen (TYLENOL) 650 MG CR tablet, Take 650 mg by mouth daily as needed., Disp: , Rfl:    aspirin EC 81 MG EC tablet, Take 1 tablet (81 mg total) by mouth daily., Disp:  , Rfl:    losartan (COZAAR) 50 MG tablet, TAKE 1 TABLET(50 MG) BY MOUTH DAILY, Disp: 90 tablet, Rfl: 1   melatonin 5 MG TABS, Take 5 mg by mouth daily., Disp: , Rfl:    rosuvastatin (CRESTOR) 5 MG tablet, TAKE 1 TABLET(5 MG) BY MOUTH 4 TIMES A WEEK, Disp: 48 tablet, Rfl: 3   tamsulosin (FLOMAX) 0.4 MG CAPS capsule, Take 1 capsule (0.4 mg total) by mouth daily., Disp: 30 capsule, Rfl: 3   vitamin E 1000 UNIT capsule, Take 1,000 Units by mouth daily with breakfast., Disp: , Rfl:    Objective:     There were no vitals filed for this visit.    There is no height or weight on file to calculate BMI.    Physical Exam:    ***   Electronically signed by:  Joe Robertson D.[facility] 9:49 AM  05/02/22

## 2022-05-03 ENCOUNTER — Ambulatory Visit: Payer: Medicare Other | Admitting: Sports Medicine

## 2022-05-04 ENCOUNTER — Other Ambulatory Visit: Payer: Self-pay | Admitting: Internal Medicine

## 2022-05-06 ENCOUNTER — Ambulatory Visit: Payer: Medicare Other

## 2022-05-06 ENCOUNTER — Ambulatory Visit: Payer: Medicare Other | Admitting: Sports Medicine

## 2022-05-06 VITALS — BP 130/80 | HR 93 | Ht 70.0 in | Wt 214.0 lb

## 2022-05-06 DIAGNOSIS — S42031A Displaced fracture of lateral end of right clavicle, initial encounter for closed fracture: Secondary | ICD-10-CM

## 2022-05-06 DIAGNOSIS — M898X1 Other specified disorders of bone, shoulder: Secondary | ICD-10-CM

## 2022-05-06 DIAGNOSIS — S42031D Displaced fracture of lateral end of right clavicle, subsequent encounter for fracture with routine healing: Secondary | ICD-10-CM | POA: Diagnosis not present

## 2022-05-06 NOTE — Patient Instructions (Signed)
Good to see you   

## 2022-05-07 ENCOUNTER — Ambulatory Visit: Payer: Medicare Other | Admitting: Neurology

## 2022-05-20 ENCOUNTER — Ambulatory Visit (INDEPENDENT_AMBULATORY_CARE_PROVIDER_SITE_OTHER): Payer: Medicare Other

## 2022-05-20 DIAGNOSIS — Z23 Encounter for immunization: Secondary | ICD-10-CM | POA: Diagnosis not present

## 2022-05-21 ENCOUNTER — Ambulatory Visit: Payer: Medicare Other | Admitting: Neurology

## 2022-05-21 ENCOUNTER — Telehealth: Payer: Self-pay | Admitting: Neurology

## 2022-05-21 ENCOUNTER — Encounter: Payer: Self-pay | Admitting: Neurology

## 2022-05-21 VITALS — BP 151/80 | HR 89 | Ht 70.0 in | Wt 215.0 lb

## 2022-05-21 DIAGNOSIS — R413 Other amnesia: Secondary | ICD-10-CM

## 2022-05-21 DIAGNOSIS — R42 Dizziness and giddiness: Secondary | ICD-10-CM | POA: Diagnosis not present

## 2022-05-21 DIAGNOSIS — G3184 Mild cognitive impairment, so stated: Secondary | ICD-10-CM

## 2022-05-21 MED ORDER — ROSUVASTATIN CALCIUM 5 MG PO TABS
5.0000 mg | ORAL_TABLET | Freq: Every day | ORAL | 3 refills | Status: DC
Start: 2022-05-21 — End: 2022-09-04

## 2022-05-21 NOTE — Telephone Encounter (Signed)
CT Angio head w/wo & CT Angio neck approved [employer] auth: [id number] (05/21/22-06/19/22).  Sent to GI for scheduling.

## 2022-05-21 NOTE — Progress Notes (Signed)
[facility] [address] [place]. [place] [address] [phone]       STROKE FOLLOW UP NOTE  [name] Date of Birth:  07-15-1941 Medical Record Number:  [id number]   Reason for Referral: stroke follow up    SUBJECTIVE:   CHIEF COMPLAINT:  Chief Complaint  Patient presents with   Follow-up    Room 17     HPI:  Update 05/21/2022 : He returns for follow-up after last visit with Joe Robertson practitioner 5 months ago.  He is accompanied by his wife.  He continues to have dizzy spells which did not there is a constant good days and bad days.  He has learned to use a cane most of the time.  He still has a few falls and fell 4 weeks ago and fractured his collarbone.  He denies any vertigo meds.  Patient feels off balance.  Patient was referred at last visit.  He was referred to therapy for vestibular rehab but he did not have a good experience with his therapist decided not to continue as he states he was falsely accused of  inappropriate behavior.  He was advised to undergo MRI scan and CT angiograms which he has not done.  Patient has a new complaint of decreased short-term memory and cognitive difficulties.  He has not had this evaluated yet.  EEG on 01/03/2022 was normal.  Denies headache or new stroke or TIA-like symptoms.  He remains on aspirin for tolerating well without bruising or bleeding.  States his blood pressure is under good control at home but today it is elevated at 151/50.  He is tolerating Crestor well without muscle aches and pains.  Lipid profile on 03/04/2022 showed LDL cholesterol to be 95 and hemoglobin A1c today 5.8.  No family history of Alzheimer's or dementia.  He denies any tinnitus or decreased hearing. Update 12/25/2021 Joe Robertson: Patient returns for sooner follow-up visit due to ongoing concerns of dizziness.  He is accompanied by his wife.  Previously seen 08/29/2021 reporting intermittent dizziness with presyncope/syncopal events.  Encouraged  cardiology evaluation which was completed 1/16 with Joe Robertson with completion of 7-day Zio patch which was unremarkable.  No further evaluation or follow-up recommended.  Reports dizziness has been persistent since his stroke in 10/2019 although did not first report this until 03/2020 and had a couple episodes of dizziness prior to hospitalization.  Dizziness has been progressively worsening since prior visit 4 months ago.  Of note, he c/o difficulty sleeping therefore started on trazodone in January with recent increase to 100 mg nightly. Reports took previously (shortly after stroke) without side effects.   Difficulty getting full details regarding dizziness due to baseline cognitive impairment but overall seems to only occur with position changes either first thing in the morning or occasionally during the day. Can last 5 to 6 minutes although he has not actually timed. At times can be severe where he has difficulty ambulating. He has not had any additional presyncope/syncopal events since 06/2021.  During dizziness episodes, denies headaches, confusion, visual changes, LOC, weakness/numbness, speech changes or altered mental status. He is aware of events, able to communicate with his wife. Denies onset with quick head or eye movements, after prolonged standing or during increased stressful situations.  Does have baseline hearing loss with use of hearing aids but denies any worsening. He has been previously treated by ENT Joe Robertson for otitis (08/2019). Believes this dizziness has greatly impacted his quality of life. Wife notes generalized  weakness and fatigue since his stroke and "just not the same". Does ambulate slower with use of cane for long distance, no recent falls. Denies LE numbness/tingling or pains. Denies low back or neck pain. Tries to be active around him home but no formal exercise regimen. Admits to limited to no water intake, will drink hot tea in the morning and occasionally drink a Gatorade.   Orthostatic vitals negative - BP laying 140/85 HR 81, standing 1 min BP 140/87 HR 85 and standing 3 min BP 141/85, HR 85.  Occasionally monitors blood pressure at home, has not yet checked blood pressure during dizziness episode as previously discussed.  Otherwise, stable from stroke standpoint.  Denies new stroke/TIA symptoms.  Compliant on aspirin and Crestor, denies side effects.  Closely followed by PCP Joe Robertson.  No further concerns at this time.       History provided for reference purposes only Update 08/29/2021 Joe Robertson: returns for 6 mo stroke f/u accompanied by his wife.  Overall stable.  Denies new stroke/TIA symptoms. C/o continued intermittent dizziness - did have a fall in November - states he started to feel very dizzy and then "blacked out" and lose consciousness that he believes only lasted for a few seconds. Once came to, denies confusion or any type of seizure activity. Dizziness typically upon getting up in the morning and randomly during the day. Reports present since stroke but first reported this at visit in 03/2020 with similar presyncope/syncopal episode. Recommend eval by cardiology but never pursued this - no additional syncopal events until recently. Does not check blood pressure with dizziness although checks daily and stable. Admits to limited water intake. Denies tinnitus or hearing loss. Denies dizziness/vertigo symptoms with quick head position changes. Dizziness is not debilitating - able to ambulate with use of cane. Cognition stable since prior visit.  Compliant on aspirin and Crestor -denies side effects.  Blood pressure today 156/88. Has f/u with PCP scheduled this Friday. No further concerns at this time.   Update 02/21/2021 Joe Robertson: Joe Robertson returns for 12-monthstroke follow-up accompanied by his wife, Joe Robertson  Stable since prior visit without new stroke/TIA symptoms.  He does report continued intermittent dizziness which has been present since his stroke as well as  short-term memory loss which can fluctuate but denies specific worsening.  He continues to ambulate with a cane and denies any recent falls.  He stays active during the day and routinely does memory exercises.  He reports he sleeps well at night and has a good appetite. Reports compliance on aspirin and Crestor 5 mg three times weekly without associated side effects.  LDL 53 down from 124 on 10/11/2020.  Blood pressure today 139/70.  No further concerns at this time.  Update 08/23/2020 Joe Robertson: Mr. PMckayreturns for 498-monthtroke follow-up accompanied by his wife.  He has been doing very well since prior visit with resolution of prior complaints including dizziness, insomnia and cognitive concerns.  He has unfortunately had a couple falls the first week of December.  Both of them in setting of slipping on a slick surface and unfortunately resulted in left hand fracture but otherwise no other injuries.  Denies falls occurring due to dizziness or lack of balance.  Denies new stroke/TIA symptoms.  Remains on aspirin and pravastatin 40 mg daily for secondary stroke prevention without side effects.  Lipid panel obtained by PCP 08/21/2020 with LDL 124 therefore Dr. BuQuay Burowlans on trialing Crestor as he has had difficulty tolerating high-dose statins.  Blood pressure today 134/74. Monitors at home which has been stable and similar to todays reading.  No concerns at this time.  Update 04/18/2020 Joe Robertson: Mr. 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  He also feels at times he has difficulty laying flat or will wake up gasping for air He has been experiencing dizziness since his stroke - unable to determine if he is experiencing a true dizziness sensation vs vertigo vs imbalance as he will start speaking of a different topic He does  report 3 episodes of vision going black (aware of his surroundings), foggy headedness and dizziness resulting in a fall.  He did lose consciousness with 2 of those episodes.  Prior episode occurred on 6/16.  Denies dyspnea, heart palpitation or racing heart type sensation. Apparently, he has spoke to his PCP regarding above concerns but she feels as though more anxiety related He is concerned as there may be something else going on or possibly medication related He has been experiencing greater right hip pain recently receiving injection by orthopedics with benefit Currently using a cane for ambulation Remains on aspirin and pravastatin 40 mg daily for secondary stroke prevention without side effects Blood pressure today 142/76 Follows closely with PCP for HTN and HLD management No further concerns at this time  Update 12/02/2019 Joe Robertson:, Mr. Robarts is being seen for hospital follow-up. He has been doing well from a stroke standpoint with residual mild cognitive impairment but denies weakness or numbness/tingling. 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 He has completed 3 weeks DAPT and continues on aspirin alone without bleeding or bruising.  Continues on pravastatin 40 mg daily tolerating well without side effects.  Blood pressure today  127/73 with similar home readings. PCP recently initiated losartan 50 mg daily for elevated BP and new diagnosis of HTN.  Also reported difficulty sleeping with increased anxiety and PCP initiated trazodone 50 mg nightly and recently increased to 100 mg nightly.  No further concerns at this time.  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  Evaluated by stroke team and Dr. 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  Recommended DAPT for 3 weeks and aspirin alone.  LDL 151 and history of statin intolerance with Zocor and Lipitor and patient agreeable to trial of pravastatin 40 mg daily.  No history of HTN or DM.  No prior history of stroke.  Evaluated by therapies and recommended home health OT and SLP and was discharged home in stable condition on 11/02/2019.  Stroke:  R thalamic infarct s/p IV tPA, secondary to small vessel disease Code Stroke CT Head - No acute intracranial abnormality.  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  MRI head - R thalamic infarct  2D Echo - EF 60-65%. No source of embolus  [employer] Virus 2 - negative LDL - 151 -initiate pravastatin 40 mg daily -history of statin intolerance to simvastatin and atorvastatin HgbA1c - 5.4 No antithrombotic prior to admission, now on  aspirin 81 mg and plavix 75 mg daily x 3 weeks then aspirin alone.  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   Left Upper Arm - Anterior (in situ)   Stroke ([place]) 10/2019    PSH:  Past Surgical History:  Procedure Laterality Date   CHOLECYSTECTOMY     INGUINAL HERNIA REPAIR Right 01/24/2017   Procedure: LAPAROSCOPIC RIGHT INGUINAL HERNIA WITH MESH;  Surgeon: Ralene Ok, MD;  Location: [facility];   Service: General;  Laterality: Right;   INSERTION OF MESH Right 01/24/2017   Procedure: INSERTION OF MESH;  Surgeon: Ralene Ok, MD;  Location: [facility];  Service: General;  Laterality: Right;    Social History:  Social History   Socioeconomic History   Marital status: Married    Spouse name: Romie Minus   Number of children: Not on file   Years of education: Not on file   Highest education level: Professional school degree (e.g., MD, DDS, DVM, JD)  Occupational History    Comment: retired Geophysicist/field seismologist  Tobacco Use   Smoking status: Former   Smokeless tobacco: Never   Tobacco comments:    as teenager  Substance and Sexual Activity   Alcohol use: No   Drug use: No   Sexual activity: Not on file  Other Topics Concern   Not on file  Social History Narrative   Not on file   Social Determinants of Health   Financial Resource Strain: Low Risk  (04/24/2022)   Overall Financial Resource Strain (CARDIA)    Difficulty of Paying Living Expenses: Not hard at all  Food Insecurity: No Food Insecurity (04/24/2022)   Hunger Vital Sign    Worried About Running Out of Food in the Last Year: Never true    Ran Out of Food in the Last Year: Never true  Transportation Needs: No Transportation Needs (04/24/2022)   PRAPARE - Hydrologist (Medical): No    Lack of Transportation (Non-Medical): No  Physical Activity: Sufficiently Active (04/24/2022)   Exercise Vital Sign    Days of Exercise per Week: 5 days    Minutes of Exercise per Session: 30 min  Stress: No Stress Concern Present (04/24/2022)   [place]    Feeling of Stress : Not at all  Social Connections: [place] (04/24/2022)   Social Connection and Isolation Panel [NHANES]    Frequency of Communication with Friends and Family: More than three times a week    Frequency of Social Gatherings with Friends and Family: More than three times a  week    Attends Religious Services: More than 4 times per year    Active Member of [employer] or Organizations: Yes    Attends Music therapist: More than 4 times per year    Marital Status: Married  Intimate Partner Violence: Not At Risk (04/24/2022)   Humiliation, Afraid, Rape, and Kick questionnaire    Fear of Current or Ex-Partner: No    Emotionally Abused: No    Physically Abused: No    Sexually Abused: No    Family History:  Family History  Problem Relation Age of Onset   Arthritis Mother    Heart disease Father    Arthritis Father    Diabetes Maternal Aunt    Cancer Paternal Uncle        lung   Stroke Maternal Grandmother    Diabetes Maternal Grandmother     Medications:   Current Outpatient Medications on File Prior to Visit  Medication Sig Dispense Refill   acetaminophen (TYLENOL) 650 MG CR tablet Take 650 mg by mouth daily as needed.     aspirin EC 81 MG EC tablet Take 1 tablet (81 mg total) by mouth daily.     losartan (COZAAR) 50 MG tablet TAKE 1 TABLET(50 MG) BY MOUTH DAILY 90 tablet 1   melatonin 5 MG TABS Take 5 mg by mouth at bedtime as needed (prn).     tamsulosin (FLOMAX) 0.4 MG CAPS capsule TAKE 1 CAPSULE(0.4 MG) BY MOUTH DAILY 30 capsule 3   vitamin E 1000 UNIT capsule Take 1,000 Units by mouth daily with breakfast.     No current facility-administered medications on file prior to visit.    Allergies:   Allergies  Allergen Reactions   Simvastatin Other (See Comments)    Muscle pain   Lipitor [Atorvastatin] Other (See Comments)    Muscle aches      OBJECTIVE:  Vitals  Vitals:   05/21/22 1118  BP: (!) 151/80  Pulse: 89  Weight: 215 lb (97.5 kg)  Height: '5\' 10"'$  (1.778 m)    No data found.   Body mass index is 30.85 kg/m. No results found.   Physical exam  General: Obese pleasant elderly Caucasian male, seated, in no evident distress Head: head normocephalic and atraumatic.   Neck: supple with no carotid or  supraclavicular bruits Cardiovascular: regular rate and rhythm, no murmurs Musculoskeletal: no deformity Skin:  no rash/petichiae Vascular:  Normal pulses all extremities   Neurologic Exam Mental Status: Awake and fully alert.  Fluent speech and language. Oriented to place and time. Recent memory impaired and remote memory appears diminished. Attention span and concentration impaired with frequently speaking off topic or not answering questions appropriately but fund of knowledge appropriate during visit.  Appears diminished mood and affect appropriate.  Mini-Mental status exam scored 19/30 with deficits in orientation, attention registration and recall.  Difficulty copying intersecting pentagons.  Clock drawing 3/4.  Able to name only 5 animals which can walk on 4 legs.  Geriatric depression scale he scored 2 which is not depressed Cranial Nerves: Pupils equal, briskly reactive to light. Extraocular movements full without nystagmus. Visual fields full to confrontation.  HOH bilaterally with use of hearing aids. Facial sensation intact. Face, tongue, palate moves normally and symmetrically.  Motor: Normal bulk and tone. Normal strength in all tested extremity muscles.  No evidence of resting or action tremor.  No evidence of cogwheel rigidity or bradykinesia. Sensory.: intact to touch , pinprick , position and vibratory sensation in BUE, decreased vibratory sensation distally RLE Coordination: Rapid alternating movements normal in all extremities. Finger-to-nose and heel-to-shin performed accurately bilaterally. Gait and Station: Arises from chair with mild difficulty. Stance is slightly stooped gait demonstrates  normal stride length although slightly decreased step height  and mild imbalance with swaying without use of cane.  Appropriate arm swing bilaterally without evidence of tremor.  Unable to complete tandem walk or heel toe.  Romberg mildly positive (learning towards right).  Fukuda stepping test  positive with patient moving place and rotating to the right Reflexes: 1+ and symmetric. Toes downgoing.       05/21/2022   11:54 AM  MMSE - Mini Mental State Exam  Orientation to time 2  Orientation to Place 4  Registration 3  Attention/ Calculation 2  Recall 0  Language- name 2 objects 2  Language- repeat 0  Language- follow 3 step command 3  Language- read & follow direction 1  Write a sentence 1  Copy design 1  Copy design-comments 4 animals, failed clock drawing  Total score 19       ASSESSMENT: Elison Worrel is a 81 y.o. 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 Vascular risk factors include HLD only and asymptomatic right vertebral artery moderate stenosis and tandem stenosis left vertebral artery.Persistent complaints of worsening dizziness likely peripheral vestibular in etiology.t since his stroke.  New complaints of memory loss and cognitive difficulties likely due to mild cognitive impairment    PLAN:  I had a long discussion with the patient and his wife regarding his chronic dizziness and recommend further evaluation with checking CT angiogram of brain and the neck to rule out occlusive vertebrobasilar disease as well as refer to vestibular rehab for dizziness exercises.  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  He was also encouraged to use his cane at all times and falls fall prevention precautions as well.  We also discussed memory compensation strategies.Patient was encouraged to participate in cognitively challenging activities like folding  close with puzzles, playing bridge and sudoku return for follow-up in the future in 6 months or [place] practitioner call earlier if needed Greater than 50% time during this prolonged 45-minute visit was spent in counseling and coordination of care about his chronic dizziness as well as new complaints of memory loss and discussion of stroke prevention and answering questions Antony Contras, MD  [facility] [address] [place],  [address]  Phone [phone] Fax [phone] Note: This document was prepared with digital dictation and possible smart phrase technology. Any transcriptional errors that result from this process are unintentional.

## 2022-05-21 NOTE — Patient Instructions (Signed)
I had a long discussion with the patient and his wife regarding his chronic dizziness and recommend further evaluation with checking CT angiogram of brain and the neck to rule out occlusive vertebrobasilar disease as well as refer to vestibular rehab for dizziness exercises.  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  He was also encouraged to use his cane at all times and falls fall prevention precautions as well.  Return for follow-up in the future in 6 months or [place] practitioner call earlier if needed

## 2022-05-22 ENCOUNTER — Encounter: Payer: Self-pay | Admitting: Adult Health

## 2022-05-22 LAB — DEMENTIA PANEL
Homocysteine: 17.2 umol/L (ref 0.0–19.2)
RPR Ser Ql: NONREACTIVE
TSH: 3.49 u[IU]/mL (ref 0.450–4.500)
Vitamin B-12: 239 pg/mL (ref 232–1245)

## 2022-05-23 DIAGNOSIS — C44329 Squamous cell carcinoma of skin of other parts of face: Secondary | ICD-10-CM | POA: Diagnosis not present

## 2022-05-23 DIAGNOSIS — C44599 Other specified malignant neoplasm of skin of other part of trunk: Secondary | ICD-10-CM | POA: Diagnosis not present

## 2022-05-23 DIAGNOSIS — D485 Neoplasm of uncertain behavior of skin: Secondary | ICD-10-CM | POA: Diagnosis not present

## 2022-05-23 DIAGNOSIS — L905 Scar conditions and fibrosis of skin: Secondary | ICD-10-CM | POA: Diagnosis not present

## 2022-05-24 NOTE — Progress Notes (Signed)
Kindly inform the patient that lab work for reversible causes of memory loss was all satisfactory

## 2022-05-31 NOTE — Progress Notes (Unsigned)
    Joe Robertson D.[place] Phone: [phone]   Assessment and Plan:     There are no diagnoses linked to this encounter.  ***   Pertinent previous records reviewed include ***   Follow Up: ***     Subjective:   I, Joe Robertson, am serving as a Education administrator for Doctor Glennon Mac   Chief Complaint: broken clavicle    HPI:    04/10/22 Patient is a 81 year old male complaining of right shoulder pain. Patient states he fell this morning and has a broken clavicle.  Denies history of similar injury.  Denies numbness/tingling/weakness in extremity.  Fluctuates between moderate and severe pain.  Is only taken Tylenol so far today.   04/17/2022 Patient states he has had no success with the sling , pain meds didn't help much , hasn't been able to sleep    05/06/2022 Patient states the heat therapy is working a t-tiny bit better    06/03/2022 Patient states   Relevant Historical Information: Hypertension  Additional pertinent review of systems negative.   Current Outpatient Medications:    acetaminophen (TYLENOL) 650 MG CR tablet, Take 650 mg by mouth daily as needed., Disp: , Rfl:    aspirin EC 81 MG EC tablet, Take 1 tablet (81 mg total) by mouth daily., Disp:  , Rfl:    losartan (COZAAR) 50 MG tablet, TAKE 1 TABLET(50 MG) BY MOUTH DAILY, Disp: 90 tablet, Rfl: 1   melatonin 5 MG TABS, Take 5 mg by mouth at bedtime as needed (prn)., Disp: , Rfl:    rosuvastatin (CRESTOR) 5 MG tablet, Take 1 tablet (5 mg total) by mouth daily., Disp: 48 tablet, Rfl: 3   tamsulosin (FLOMAX) 0.4 MG CAPS capsule, TAKE 1 CAPSULE(0.4 MG) BY MOUTH DAILY, Disp: 30 capsule, Rfl: 3   vitamin E 1000 UNIT capsule, Take 1,000 Units by mouth daily with breakfast., Disp: , Rfl:    Objective:     There were no vitals filed for this visit.    There is no height or weight on file to calculate BMI.    Physical Exam:     ***   Electronically signed by:  Joe Robertson D.[facility] 3:42 PM 05/31/22

## 2022-06-01 ENCOUNTER — Ambulatory Visit
Admission: RE | Admit: 2022-06-01 | Discharge: 2022-06-01 | Disposition: A | Discharge status: Home / Self Care | Payer: Medicare Other | Source: Ambulatory Visit | Attending: Adult Health | Admitting: Adult Health

## 2022-06-01 DIAGNOSIS — I6381 Other cerebral infarction due to occlusion or stenosis of small artery: Secondary | ICD-10-CM | POA: Diagnosis not present

## 2022-06-03 ENCOUNTER — Ambulatory Visit (INDEPENDENT_AMBULATORY_CARE_PROVIDER_SITE_OTHER): Payer: Medicare Other

## 2022-06-03 ENCOUNTER — Ambulatory Visit: Payer: Medicare Other | Admitting: Sports Medicine

## 2022-06-03 VITALS — HR 95 | Ht 70.0 in | Wt 215.0 lb

## 2022-06-03 DIAGNOSIS — S42031D Displaced fracture of lateral end of right clavicle, subsequent encounter for fracture with routine healing: Secondary | ICD-10-CM

## 2022-06-03 DIAGNOSIS — S42031A Displaced fracture of lateral end of right clavicle, initial encounter for closed fracture: Secondary | ICD-10-CM | POA: Diagnosis not present

## 2022-06-03 NOTE — Patient Instructions (Signed)
Good to see you   

## 2022-06-04 ENCOUNTER — Encounter: Payer: Self-pay | Admitting: Adult Health

## 2022-06-06 ENCOUNTER — Encounter: Payer: Self-pay | Admitting: Internal Medicine

## 2022-06-06 DIAGNOSIS — C44329 Squamous cell carcinoma of skin of other parts of face: Secondary | ICD-10-CM | POA: Insufficient documentation

## 2022-07-04 DIAGNOSIS — C44329 Squamous cell carcinoma of skin of other parts of face: Secondary | ICD-10-CM | POA: Diagnosis not present

## 2022-08-02 ENCOUNTER — Other Ambulatory Visit: Payer: Self-pay | Admitting: Internal Medicine

## 2022-08-26 ENCOUNTER — Encounter: Payer: Self-pay | Admitting: Internal Medicine

## 2022-08-26 NOTE — Progress Notes (Signed)
Subjective:    Patient ID: Joe Robertson, male    DOB: [birth year], 82 y.o.   MRN: [id number]     HPI Joe Robertson is here for a physical exam.  Periods of anxiety - has difficulty breathing at these times - typically occurs at night.  He feels SOB when laying flat on the back and repositioning helps.  Sometimes it occurs during the day - maybe occurs with napping.  No generalized anxiety.  This started over one year ago.  He typically sleeps with more than 1 pillow.  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  Sometimes snores at night. No witnessed apnea     Medications and allergies reviewed with patient and updated if appropriate.  Current Outpatient Medications on File Prior to Visit  Medication Sig Dispense Refill   acetaminophen (TYLENOL) 650 MG CR tablet Take 650 mg by mouth daily as needed.     aspirin EC 81 MG EC tablet Take 1 tablet (81 mg total) by mouth daily.     losartan (COZAAR) 50 MG tablet TAKE 1 TABLET(50 MG) BY MOUTH DAILY 90 tablet 1   melatonin 5 MG TABS Take 5 mg by mouth at bedtime as needed (prn).     rosuvastatin (CRESTOR) 5 MG tablet Take 1 tablet (5 mg total) by mouth daily. 48 tablet 3   tamsulosin (FLOMAX) 0.4 MG CAPS capsule TAKE 1 CAPSULE(0.4 MG) BY MOUTH DAILY 30 capsule 3   vitamin E 1000 UNIT capsule Take 1,000 Units by mouth daily with breakfast.     No current facility-administered medications on file prior to visit.    Review of Systems  Constitutional:  Negative for chills and fever.  Eyes:  Negative for visual disturbance.  Respiratory:  Positive for shortness of breath (with reclining or laying down). Negative for cough and wheezing.   Cardiovascular:  Negative for chest pain, palpitations and leg swelling.  Gastrointestinal:  Negative for abdominal pain, blood in stool, constipation, diarrhea and nausea.       No gerd  Genitourinary:  Negative for difficulty urinating and dysuria.  Musculoskeletal:  Negative for arthralgias and back pain.   Skin:  Negative for rash.  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 Negative for light-headedness and headaches.  Psychiatric/Behavioral:  Negative for dysphoric mood and sleep disturbance. The patient is not nervous/anxious.        Objective:   Vitals:   08/27/22 0903  BP: 138/72  Pulse: 82  Temp: 98.1 F (36.7 C)  SpO2: 98%   Filed Weights   08/27/22 0903  Weight: 217 lb 3.2 oz (98.5 kg)   Body mass index is 31.16 kg/m.  BP Readings from Last 3 Encounters:  08/27/22 138/72  05/21/22 (!) 151/80  05/06/22 130/80    Wt Readings from Last 3 Encounters:  08/27/22 217 lb 3.2 oz (98.5 kg)  06/03/22 215 lb (97.5 kg)  05/21/22 215 lb (97.5 kg)      Physical Exam Constitutional: He appears well-developed and well-nourished. No distress.  HENT:  Head: Normocephalic and atraumatic.  Right Ear: External ear normal.  Left Ear: External ear normal.  Mouth/Throat: Oropharynx is clear and moist.  Normal ear canals and TM b/l  Eyes: Conjunctivae and EOM are normal.  Neck: Neck supple. No tracheal deviation present. No thyromegaly present.  No carotid bruit  Cardiovascular: Normal rate, regular rhythm, normal heart sounds and intact distal pulses.   No murmur heard. Pulmonary/Chest: Effort normal and breath sounds normal.  No respiratory distress. He has no wheezes. He has no rales.  Abdominal: Soft. He exhibits no distension. There is no tenderness.  Genitourinary: deferred  Musculoskeletal: He exhibits no edema.  Lymphadenopathy:   He has no cervical adenopathy.  Skin: Skin is warm and dry. He is not diaphoretic.  Psychiatric: He has a normal mood and affect. His behavior is normal.         Assessment & Plan:   Physical exam: Screening blood work  ordered Exercise   not regular Weight obese-encouraged weight loss Substance abuse   none   Reviewed recommended immunizations.   Health Maintenance  Topic Date Due   Zoster Vaccines-  Shingrix (1 of 2) Never done   COVID-19 Vaccine (4 - 2023-24 season) 04/19/2022   DTaP/Tdap/Td (2 - Td or Tdap) 05/18/2028   Pneumonia Vaccine 38+ Years old  Completed   INFLUENZA VACCINE  Completed   HPV VACCINES  Aged Out     See Problem List for Assessment and Plan of chronic medical problems.

## 2022-08-26 NOTE — Patient Instructions (Addendum)
Blood work was ordered.   The lab is on the first floor.    Medications changes include :   start taking 500 mcg of vitamin B 12 daily     Return in about 6 months (around 02/25/2023) for follow up.    Health Maintenance, Male Adopting a healthy lifestyle and getting preventive care are important in promoting health and wellness. Ask your health care provider about: The right schedule for you to have regular tests and exams. Things you can do on your own to prevent diseases and keep yourself healthy. What should I know about diet, weight, and exercise? Eat a healthy diet  Eat a diet that includes plenty of vegetables, fruits, low-fat dairy products, and lean protein. Do not eat a lot of foods that are high in solid fats, added sugars, or sodium. Maintain a healthy weight Body mass index (BMI) is a measurement that can be used to identify possible weight problems. It estimates body fat based on height and weight. Your health care provider can help determine your BMI and help you achieve or maintain a healthy weight. Get regular exercise Get regular exercise. This is one of the most important things you can do for your health. Most adults should: Exercise for at least 150 minutes each week. The exercise should increase your heart rate and make you sweat (moderate-intensity exercise). Do strengthening exercises at least twice a week. This is in addition to the moderate-intensity exercise. Spend less time sitting. Even light physical activity can be beneficial. Watch cholesterol and blood lipids Have your blood tested for lipids and cholesterol at 81 years of age, then have this test every 5 years. You may need to have your cholesterol levels checked more often if: Your lipid or cholesterol levels are high. You are older than 81 years of age. You are at high risk for heart disease. What should I know about cancer screening? Many types of cancers can be detected early and may  often be prevented. Depending on your health history and family history, you may need to have cancer screening at various ages. This may include screening for: Colorectal cancer. Prostate cancer. Skin cancer. Lung cancer. What should I know about heart disease, diabetes, and high blood pressure? Blood pressure and heart disease High blood pressure causes heart disease and increases the risk of stroke. This is more likely to develop in people who have high blood pressure readings or are overweight. Talk with your health care provider about your target blood pressure readings. Have your blood pressure checked: Every 3-5 years if you are 46-62 years of age. Every year if you are 61 years old or older. If you are between the ages of 53 and 78 and are a current or former smoker, ask your health care provider if you should have a one-time screening for abdominal aortic aneurysm (AAA). Diabetes Have regular diabetes screenings. This checks your fasting blood sugar level. Have the screening done: Once every three years after age 59 if you are at a normal weight and have a low risk for diabetes. More often and at a younger age if you are overweight or have a high risk for diabetes. What should I know about preventing infection? Hepatitis B If you have a higher risk for hepatitis B, you should be screened for this virus. Talk with your health care provider to find out if you are at risk for hepatitis B infection. Hepatitis C Blood testing is recommended for: Everyone  born from [birth year] through 1965. Anyone with known risk factors for hepatitis C. Sexually transmitted infections (STIs) You should be screened each year for STIs, including gonorrhea and chlamydia, if: You are sexually active and are younger than 81 years of age. You are older than 81 years of age and your health care provider tells you that you are at risk for this type of infection. Your sexual activity has changed since you were last  screened, and you are at increased risk for chlamydia or gonorrhea. Ask your health care provider if you are at risk. Ask your health care provider about whether you are at high risk for HIV. Your health care provider may recommend a prescription medicine to help prevent HIV infection. If you choose to take medicine to prevent HIV, you should first get tested for HIV. You should then be tested every 3 months for as long as you are taking the medicine. Follow these instructions at home: Alcohol use Do not drink alcohol if your health care provider tells you not to drink. If you drink alcohol: Limit how much you have to 0-2 drinks a day. Know how much alcohol is in your drink. In the [place] one drink equals one 12 oz bottle of beer (355 mL), one 5 oz glass of wine (148 mL), or one 1 oz glass of hard liquor (44 mL). Lifestyle Do not use any products that contain nicotine or tobacco. These products include cigarettes, chewing tobacco, and vaping devices, such as e-cigarettes. If you need help quitting, ask your health care provider. Do not use street drugs. Do not share needles. Ask your health care provider for help if you need support or information about quitting drugs. General instructions Schedule regular health, dental, and eye exams. Stay current with your vaccines. Tell your health care provider if: You often feel depressed. You have ever been abused or do not feel safe at home. Summary Adopting a healthy lifestyle and getting preventive care are important in promoting health and wellness. Follow your health care provider's instructions about healthy diet, exercising, and getting tested or screened for diseases. Follow your health care provider's instructions on monitoring your cholesterol and blood pressure. This information is not intended to replace advice given to you by your health care provider. Make sure you discuss any questions you have with your health care provider. Document  Revised: 12/25/2020 Document Reviewed: 12/25/2020 Elsevier Patient Education  [place].

## 2022-08-27 ENCOUNTER — Encounter: Payer: Self-pay | Admitting: Internal Medicine

## 2022-08-27 ENCOUNTER — Ambulatory Visit (INDEPENDENT_AMBULATORY_CARE_PROVIDER_SITE_OTHER): Payer: Medicare Other | Admitting: Internal Medicine

## 2022-08-27 VITALS — BP 138/72 | HR 82 | Temp 98.1°F | Ht 70.0 in | Wt 217.2 lb

## 2022-08-27 DIAGNOSIS — G3184 Mild cognitive impairment, so stated: Secondary | ICD-10-CM | POA: Diagnosis not present

## 2022-08-27 DIAGNOSIS — R7303 Prediabetes: Secondary | ICD-10-CM | POA: Diagnosis not present

## 2022-08-27 DIAGNOSIS — R0601 Orthopnea: Secondary | ICD-10-CM | POA: Insufficient documentation

## 2022-08-27 DIAGNOSIS — E559 Vitamin D deficiency, unspecified: Secondary | ICD-10-CM | POA: Diagnosis not present

## 2022-08-27 DIAGNOSIS — I10 Essential (primary) hypertension: Secondary | ICD-10-CM

## 2022-08-27 DIAGNOSIS — R7989 Other specified abnormal findings of blood chemistry: Secondary | ICD-10-CM

## 2022-08-27 DIAGNOSIS — Z0001 Encounter for general adult medical examination with abnormal findings: Secondary | ICD-10-CM

## 2022-08-27 DIAGNOSIS — R413 Other amnesia: Secondary | ICD-10-CM

## 2022-08-27 DIAGNOSIS — N400 Enlarged prostate without lower urinary tract symptoms: Secondary | ICD-10-CM

## 2022-08-27 DIAGNOSIS — E782 Mixed hyperlipidemia: Secondary | ICD-10-CM | POA: Diagnosis not present

## 2022-08-27 DIAGNOSIS — R739 Hyperglycemia, unspecified: Secondary | ICD-10-CM

## 2022-08-27 DIAGNOSIS — Z0000 Encounter for general adult medical examination without abnormal findings: Secondary | ICD-10-CM

## 2022-08-27 DIAGNOSIS — Z8673 Personal history of transient ischemic attack (TIA), and cerebral infarction without residual deficits: Secondary | ICD-10-CM

## 2022-08-27 LAB — COMPREHENSIVE METABOLIC PANEL
ALT: 18 U/L (ref 0–53)
AST: 21 U/L (ref 0–37)
Albumin: 4.4 g/dL (ref 3.5–5.2)
Alkaline Phosphatase: 58 U/L (ref 39–117)
BUN: 14 mg/dL (ref 6–23)
CO2: 27 mEq/L (ref 19–32)
Calcium: 9.5 mg/dL (ref 8.4–10.5)
Chloride: 105 mEq/L (ref 96–112)
Creatinine, Ser: 1.17 mg/dL (ref 0.40–1.50)
GFR: 58.98 mL/min — ABNORMAL LOW (ref >60.00)
Glucose, Bld: 88 mg/dL (ref 70–99)
Potassium: 4.6 mEq/L (ref 3.5–5.1)
Sodium: 142 mEq/L (ref 135–145)
Total Bilirubin: 0.9 mg/dL (ref 0.2–1.2)
Total Protein: 7.6 g/dL (ref 6.0–8.3)

## 2022-08-27 LAB — CBC WITH DIFFERENTIAL/PLATELET
Basophils Absolute: 0 10*3/uL (ref 0.0–0.1)
Basophils Relative: 0.3 % (ref 0.0–3.0)
Eosinophils Absolute: 0.3 10*3/uL (ref 0.0–0.7)
Eosinophils Relative: 3 % (ref 0.0–5.0)
HCT: 49.4 % (ref 39.0–52.0)
Hemoglobin: 16.1 g/dL (ref 13.0–17.0)
Lymphocytes Relative: 43.1 % (ref 12.0–46.0)
Lymphs Abs: 3.9 10*3/uL (ref 0.7–4.0)
MCHC: 32.5 g/dL (ref 30.0–36.0)
MCV: 93.5 fl (ref 78.0–100.0)
Monocytes Absolute: 0.9 10*3/uL (ref 0.1–1.0)
Monocytes Relative: 10.5 % (ref 3.0–12.0)
Neutro Abs: 3.9 10*3/uL (ref 1.4–7.7)
Neutrophils Relative %: 43.1 % (ref 43.0–77.0)
Platelets: 223 10*3/uL (ref 150.0–400.0)
RBC: 5.28 Mil/uL (ref 4.22–5.81)
RDW: 14.5 % (ref 11.5–15.5)
WBC: 9 10*3/uL (ref 4.0–10.5)

## 2022-08-27 LAB — LIPID PANEL
Cholesterol: 150 mg/dL (ref 0–200)
HDL: 40.8 mg/dL (ref >39.00)
LDL Cholesterol: 90 mg/dL (ref 0–99)
NonHDL: 109.45
Total CHOL/HDL Ratio: 4
Triglycerides: 99 mg/dL (ref 0.0–149.0)
VLDL: 19.8 mg/dL (ref 0.0–40.0)

## 2022-08-27 LAB — VITAMIN D 25 HYDROXY (VIT D DEFICIENCY, FRACTURES): VITD: 35.02 ng/mL (ref 30.00–100.00)

## 2022-08-27 LAB — VITAMIN B12: Vitamin B-12: 168 pg/mL — ABNORMAL LOW (ref 211–911)

## 2022-08-27 LAB — TSH: TSH: 2.05 u[IU]/mL (ref 0.35–5.50)

## 2022-08-27 LAB — HEMOGLOBIN A1C: Hgb A1c MFr Bld: 5.7 % (ref 4.6–6.5)

## 2022-08-27 NOTE — Assessment & Plan Note (Signed)
Chronic Check a1c Low sugar / carb diet Stressed regular exercise  

## 2022-08-27 NOTE — Assessment & Plan Note (Signed)
Chronic For over a year now when he lays down on his back he feels short of breath and repositioning helps this and his shortness of breath resolves The shortness of breath does make him feel anxious Sounds like he is always slept with 2 pillows so this is not new, but he is a poor historian secondary to his MCI no further history is not available I do not think this is an overall general anxiety or panic attack issue, but more than that the difficulty breathing resulted in some anxiety which is to be expected Discussed chest x-ray and further evaluation with a specialist for this-he deferred for now-he will monitor and let me know if this worsens

## 2022-08-27 NOTE — Assessment & Plan Note (Signed)
Chronic Controlled, Stable Continue Flomax 0.4 mg daily

## 2022-08-27 NOTE — Assessment & Plan Note (Signed)
Chronic Saw Dr. Leonie Man 05/2022-MMSE 19

## 2022-08-27 NOTE — Assessment & Plan Note (Addendum)
Chronic Regular exercise and healthy diet encouraged Check lipid panel  Continue Crestor 5 mg daily-discussed that if LDL was not at goal of less than 70 which has not been that we should consider increasing the Crestor or adding Zetia-advised them to think about this and I will ask them after blood work returns if they wish to make a change

## 2022-08-27 NOTE — Assessment & Plan Note (Signed)
Chronic Blood pressure well controlled CMP Continue losartan 50 mg daily 

## 2022-08-27 NOTE — Assessment & Plan Note (Signed)
History of stroke-residual dizziness LDL not at goal-discussed increasing Crestor or adding Zetia Continue aspirin 81 mg daily Blood pressure controlled Encouraged healthy diet, regular exercise

## 2022-08-27 NOTE — Assessment & Plan Note (Signed)
Chronic Taking vitamin D daily Check vitamin D level  

## 2022-09-03 ENCOUNTER — Encounter: Payer: Self-pay | Admitting: Internal Medicine

## 2022-09-04 ENCOUNTER — Ambulatory Visit: Payer: Medicare Other | Admitting: Internal Medicine

## 2022-09-04 MED ORDER — ROSUVASTATIN CALCIUM 10 MG PO TABS: 10.0000 mg | ORAL_TABLET | Freq: Every day | ORAL | 3 refills | Status: DC

## 2022-10-25 ENCOUNTER — Other Ambulatory Visit: Payer: Self-pay

## 2022-10-25 ENCOUNTER — Encounter: Payer: Self-pay | Admitting: Internal Medicine

## 2022-10-25 MED ORDER — ROSUVASTATIN CALCIUM 10 MG PO TABS: 10.0000 mg | ORAL_TABLET | Freq: Every day | ORAL | 3 refills | Status: DC

## 2022-10-31 ENCOUNTER — Other Ambulatory Visit: Payer: Self-pay | Admitting: Neurology

## 2022-10-31 ENCOUNTER — Other Ambulatory Visit: Payer: Self-pay | Admitting: Internal Medicine

## 2022-11-19 ENCOUNTER — Telehealth: Payer: Self-pay | Admitting: Adult Health

## 2022-11-19 NOTE — Telephone Encounter (Signed)
LVM and sent mychart msg informing pt of need to reschedule 11/21/22 appointment - NP out 

## 2022-11-21 ENCOUNTER — Ambulatory Visit: Payer: Medicare Other | Admitting: Adult Health

## 2022-12-13 ENCOUNTER — Ambulatory Visit (INDEPENDENT_AMBULATORY_CARE_PROVIDER_SITE_OTHER): Payer: Medicare Other

## 2022-12-13 ENCOUNTER — Ambulatory Visit: Payer: Medicare Other | Admitting: Sports Medicine

## 2022-12-13 VITALS — Ht 70.0 in | Wt 217.0 lb

## 2022-12-13 DIAGNOSIS — S99911A Unspecified injury of right ankle, initial encounter: Secondary | ICD-10-CM

## 2022-12-13 DIAGNOSIS — S62511A Displaced fracture of proximal phalanx of right thumb, initial encounter for closed fracture: Secondary | ICD-10-CM

## 2022-12-13 DIAGNOSIS — M79641 Pain in right hand: Secondary | ICD-10-CM | POA: Diagnosis not present

## 2022-12-13 DIAGNOSIS — S62616A Displaced fracture of proximal phalanx of right little finger, initial encounter for closed fracture: Secondary | ICD-10-CM | POA: Diagnosis not present

## 2022-12-13 DIAGNOSIS — S92001A Unspecified fracture of right calcaneus, initial encounter for closed fracture: Secondary | ICD-10-CM | POA: Diagnosis not present

## 2022-12-13 MED ORDER — TRAMADOL HCL 50 MG PO TABS
50.0000 mg | ORAL_TABLET | Freq: Three times a day (TID) | ORAL | 0 refills | Status: AC | PRN
Start: 2022-12-13 — End: 2022-12-18

## 2022-12-13 NOTE — Patient Instructions (Signed)
1 week follow up

## 2022-12-13 NOTE — Progress Notes (Signed)
Joe Robertson D.[facility] [address] [place] [address] Phone: [phone]   Assessment and Plan:     1. Right ankle injury, initial encounter -Acute, complicated, initial sports medicine visit - Concern for fracture of calcaneus based on HPI, physical exam, x-ray - X-ray obtained in clinic.  Cortical irregularity seen in calcaneus on oblique view appears similar to an OCD lesion, however this would be unusual based on trauma occurring this morning, patient age.  No definitive fracture seen, however patient is incredibly tender to palpation of calcaneus. - Recommend patient be nonweightbearing, in boot for protection, using crutches - Recommend Tylenol [phone] mg 2-3 times a day for pain relief -- DG Ankle Complete Right; Future - DG Foot Complete Right; Future - traMADol (ULTRAM) 50 MG tablet; Take 1 tablet (50 mg total) by mouth every 8 (eight) hours as needed for up to 5 days.  2. Right hand pain 3. Displaced fracture of proximal phalanx of right thumb, initial encounter for closed fracture -Acute, uncomplicated, initial sports medicine visit - Displaced fracture of proximal phalanx of first digit seen on x-ray at today's visit occurring from fall this morning - Patient placed in thumb spica splint at today's visit and instructed to be nonweightbearing - Start Tylenol 500 to 1000 mg tablets 2-3 times a day for day-to-day pain relief - May use tramadol as needed for breakthrough pain - Patient was neurovascularly intact on physical exam.  Discussed that with angulation of fracture, we could attempt a reduction, however based on angulation, I am not confident that fracture would have stayed reduced.  Patient ultimately decided against reduction attempt. - traMADol (ULTRAM) 50 MG tablet; Take 1 tablet (50 mg total) by mouth every 8 (eight) hours as needed for up to 5 days.    Time of visit   43 minutes, which included chart review, physical  exam, treatment plan being performed, interpreted, and discussed with patient at today's visit.   Pertinent previous records reviewed include none   Follow Up: 1 week for reevaluation, repeat x-rays of ankle and hand   Subjective:   I, Joe Robertson, am serving as a Neurosurgeon for Doctor Richardean Sale  Chief Complaint: right ankle and right thumb pain   HPI:   12/13/22 Patient is a 82 year old male complaining of right ankle and right thumb pain. Patient states he fell from a ladder, fall was today , decreased ROM and pain , right foot pain pain , no meds for the pain   Relevant Historical Information: Hypertension  Additional pertinent review of systems negative.   Current Outpatient Medications:    acetaminophen (TYLENOL) 650 MG CR tablet, Take 650 mg by mouth daily as needed., Disp: , Rfl:    aspirin EC 81 MG EC tablet, Take 1 tablet (81 mg total) by mouth daily., Disp:  , Rfl:    losartan (COZAAR) 50 MG tablet, TAKE 1 TABLET(50 MG) BY MOUTH DAILY, Disp: 90 tablet, Rfl: 1   melatonin 5 MG TABS, Take 5 mg by mouth at bedtime as needed (prn)., Disp: , Rfl:    rosuvastatin (CRESTOR) 10 MG tablet, Take 1 tablet (10 mg total) by mouth daily., Disp: 90 tablet, Rfl: 3   tamsulosin (FLOMAX) 0.4 MG CAPS capsule, TAKE 1 CAPSULE(0.4 MG) BY MOUTH DAILY, Disp: 30 capsule, Rfl: 3   traMADol (ULTRAM) 50 MG tablet, Take 1 tablet (50 mg total) by mouth every 8 (eight) hours as needed for up to 5 days.,  Disp: 15 tablet, Rfl: 0   vitamin E 1000 UNIT capsule, Take 1,000 Units by mouth daily with breakfast., Disp: , Rfl:    Objective:     Vitals:   12/13/22 1347  Weight: 217 lb (98.4 kg)  Height: 5\' 10"  (1.778 m)      Body mass index is 31.14 kg/m.    Physical Exam:    General: Appears well, nad, nontoxic and pleasant Neuro:sensation intact, strength is 5/5 with df/pf/inv/ev, muscle tone wnl Skin:no susupicious lesions or rashes  Right hand/wrist:   Swelling surrounding IP and first  MCP Tenderness to proximal phalanx of first digit.  No open lesion, bleeding Wrist ROM  Ext 90, flexion70, radial/ulnar deviation 30 First digit neurovascularly intact   Movement maintained at first MCP.  Flexion reduced at IP  Right foot/ankle:  No deformity, no swelling or effusion TTP calcaneus NTTP over fibular head, lat mal, medial mal, achilles, navicular, base of 5th, ATFL, CFL, deltoid,   or midfoot Pain with weightbearing Pain with compression Test of calcaneus Ankle ROM DF 30, PF 45, inv/ev intact Negative ant drawer, talar tilt, rotation test,      Electronically signed by:  Joe Robertson [facility] 2:46 PM 12/13/22

## 2022-12-16 ENCOUNTER — Telehealth: Payer: Self-pay | Admitting: Sports Medicine

## 2022-12-16 NOTE — Telephone Encounter (Signed)
Patient's wife called asking if Jarreau needs to wear the book and wrist brace at night?  Please advise. (Okay to send through MyChart)

## 2022-12-19 NOTE — Progress Notes (Signed)
Joe Robertson D[facility] [address] [place] [address] Phone: [phone]   Assessment and Plan:     1. Right hand pain 2. Displaced fracture of proximal phalanx of right thumb, initial encounter for closed fracture -Acute, uncomplicated, subsequent visit - Repeat x-ray in clinic today showed continued mild displacement of fracture of proximal phalanx of first digit without significant change compared to prior x-ray - Patient has minimal motion at IP joint.  Discussed that if we allow patient's fracture to heal, he may not regain motion at this joint.  We could consider a referral to orthopedic hand surgery for evaluation to see if pinning would improve motion and displacement.  Patient stated that he was not interested in referral and would be okay with long-term decrease and range of motion at IP joint. - Continue Tylenol as needed for day-to-day pain relief - May use tramadol as needed for severe breakthrough pain. - Patient remains neurovascularly intact on physical exam  3. Right ankle injury, subsequent encounter 4. Closed nondisplaced fracture of body of right calcaneus with routine healing, subsequent encounter  -Acute, complicated, subsequent visit - No worsening of calcaneal heel fracture as seen on previous office visit. - Patient is currently pain-free with ambulation in boot and using cane - Patient provided with postop shoe.  Patient should be pain-free with ambulation if using postop shoe.  If pain recurs, recommend transitioning to boot, and if pain is persistent in boot, recommend nonweightbearing - Use Tylenol as needed for day-to-day pain relief - May use tramadol as needed for severe breakthrough pain  Pertinent previous records reviewed include none   Follow Up: 5 weeks for reevaluation.  Could consider repeat x-rays of foot and hand, right   Subjective:   I, [name], am serving as a Neurosurgeon for Doctor Richardean Sale   Chief Complaint: right ankle and right thumb pain    HPI:    12/13/22 Patient is a 82 year old male complaining of right ankle and right thumb pain. Patient states he fell from a ladder, fall was today , decreased ROM and pain , right foot pain pain , no meds for the pain   12/20/2022 Patient states he is sore but nothing unusual     Relevant Historical Information: Hypertension  Additional pertinent review of systems negative.   Current Outpatient Medications:    acetaminophen (TYLENOL) 650 MG CR tablet, Take 650 mg by mouth daily as needed., Disp: , Rfl:    aspirin EC 81 MG EC tablet, Take 1 tablet (81 mg total) by mouth daily., Disp:  , Rfl:    losartan (COZAAR) 50 MG tablet, TAKE 1 TABLET(50 MG) BY MOUTH DAILY, Disp: 90 tablet, Rfl: 1   melatonin 5 MG TABS, Take 5 mg by mouth at bedtime as needed (prn)., Disp: , Rfl:    rosuvastatin (CRESTOR) 10 MG tablet, Take 1 tablet (10 mg total) by mouth daily., Disp: 90 tablet, Rfl: 3   tamsulosin (FLOMAX) 0.4 MG CAPS capsule, TAKE 1 CAPSULE(0.4 MG) BY MOUTH DAILY, Disp: 30 capsule, Rfl: 3   vitamin E 1000 UNIT capsule, Take 1,000 Units by mouth daily with breakfast., Disp: , Rfl:    Objective:     Vitals:   12/20/22 0948  Pulse: 89  SpO2: 98%  Weight: 217 lb (98.4 kg)  Height: 5\' 10"  (1.778 m)      Body mass index is 31.14 kg/m.    Physical Exam:  General: Appears well, nad, nontoxic and pleasant Neuro:sensation intact, strength is 5/5 with df/pf/inv/ev, muscle tone wnl Skin:no susupicious lesions or rashes   Right hand/wrist:   Swelling surrounding IP and first MCP Tenderness to proximal phalanx of first digit.  No open lesion, bleeding Wrist ROM  Ext 90, flexion70, radial/ulnar deviation 30 First digit neurovascularly intact   Movement maintained at first MCP.  Flexion reduced at IP   Right foot/ankle:  No deformity, no swelling or effusion TTP calcaneus NTTP over fibular head, lat mal, medial mal,  achilles, navicular, base of 5th, ATFL, CFL, deltoid,   or midfoot Pain with weightbearing Pain with compression Test of calcaneus Ankle ROM DF 30, PF 45, inv/ev intact Negative ant drawer, talar tilt, rotation test,       Electronically signed by:  Joe Robertson [facility] 10:31 AM 12/20/22

## 2022-12-20 ENCOUNTER — Ambulatory Visit: Payer: Medicare Other | Admitting: Sports Medicine

## 2022-12-20 ENCOUNTER — Ambulatory Visit (INDEPENDENT_AMBULATORY_CARE_PROVIDER_SITE_OTHER): Payer: Medicare Other

## 2022-12-20 VITALS — HR 89 | Ht 70.0 in | Wt 217.0 lb

## 2022-12-20 DIAGNOSIS — S62514A Nondisplaced fracture of proximal phalanx of right thumb, initial encounter for closed fracture: Secondary | ICD-10-CM | POA: Diagnosis not present

## 2022-12-20 DIAGNOSIS — M79641 Pain in right hand: Secondary | ICD-10-CM

## 2022-12-20 DIAGNOSIS — S99911D Unspecified injury of right ankle, subsequent encounter: Secondary | ICD-10-CM | POA: Diagnosis not present

## 2022-12-20 DIAGNOSIS — S99911A Unspecified injury of right ankle, initial encounter: Secondary | ICD-10-CM

## 2022-12-20 DIAGNOSIS — S62511A Displaced fracture of proximal phalanx of right thumb, initial encounter for closed fracture: Secondary | ICD-10-CM | POA: Diagnosis not present

## 2022-12-20 DIAGNOSIS — S92014D Nondisplaced fracture of body of right calcaneus, subsequent encounter for fracture with routine healing: Secondary | ICD-10-CM

## 2022-12-20 DIAGNOSIS — M25571 Pain in right ankle and joints of right foot: Secondary | ICD-10-CM | POA: Diagnosis not present

## 2022-12-20 NOTE — Patient Instructions (Signed)
Good to see you Can use post op shoe if you are pain free when walking, but if you have pain recommend using the boot  Recommend using the cane for assistance  5 week follow up

## 2023-01-17 NOTE — Progress Notes (Signed)
Joe Robertson [facility] [address] [place] [address] Phone: [phone]   Assessment and Plan:     1. Right hand pain 2. Displaced fracture of proximal phalanx of right thumb, subsequent encounter for fracture with routine healing -Subacute, routine healing, subsequent visit - Patient has no pain of first digit, and NTTP, so I believe bone is fully healed at this time.  Patient has lost flexion at IP joint which we had discussed would likely occur without surgical correction.  Patient understood then and understands that this range of motion will likely not return. - Can continue Tylenol as needed for day-to-day pain relief - May return to previous level of physical activity.  Advised to use cane or walker for support to prevent future falls  3. Right ankle injury, subsequent encounter  -Subacute, improved, subsequent visit - No pain since previous office visit and patient has not had to use postop shoe - May return to previous level of physical activity.  Advised to use cane or walker for support to prevent future falls  Pertinent previous records reviewed include none   Follow Up: As needed   Subjective:   I, [name], am serving as a Neurosurgeon for Doctor Richardean Sale   Chief Complaint: right ankle and right thumb pain    HPI:    12/13/22 Patient is a 82 year old male complaining of right ankle and right thumb pain. Patient states he fell from a ladder, fall was today , decreased ROM and pain , right foot pain pain , no meds for the pain    12/20/2022 Patient states he is sore but nothing unusual    01/24/2023 Patient states no pain he states he is doing fine     Relevant Historical Information: Hypertension  Additional pertinent review of systems negative.   Current Outpatient Medications:    acetaminophen (TYLENOL) 650 MG CR tablet, Take 650 mg by mouth daily as needed., Disp: , Rfl:    aspirin EC 81 MG EC  tablet, Take 1 tablet (81 mg total) by mouth daily., Disp:  , Rfl:    losartan (COZAAR) 50 MG tablet, TAKE 1 TABLET(50 MG) BY MOUTH DAILY, Disp: 90 tablet, Rfl: 1   melatonin 5 MG TABS, Take 5 mg by mouth at bedtime as needed (prn)., Disp: , Rfl:    rosuvastatin (CRESTOR) 10 MG tablet, Take 1 tablet (10 mg total) by mouth daily., Disp: 90 tablet, Rfl: 3   tamsulosin (FLOMAX) 0.4 MG CAPS capsule, TAKE 1 CAPSULE(0.4 MG) BY MOUTH DAILY, Disp: 30 capsule, Rfl: 3   vitamin E 1000 UNIT capsule, Take 1,000 Units by mouth daily with breakfast., Disp: , Rfl:    Objective:     Vitals:   01/24/23 0944  Pulse: 78  SpO2: 95%  Weight: 208 lb (94.3 kg)  Height: 5\' 10"  (1.778 m)      Body mass index is 29.84 kg/m.    Physical Exam:    General: Appears well, nad, nontoxic and pleasant Neuro:sensation intact, strength is 5/5 with df/pf/inv/ev, muscle tone wnl Skin:no susupicious lesions or rashes   Right hand/wrist:   No Tenderness to proximal phalanx of first digit.  No open lesion, bleeding Wrist ROM  Ext 90, flexion70, radial/ulnar deviation 30 First digit neurovascularly intact   Movement maintained at first MCP.  Flexion severely limited at IP   Right foot/ankle:  No deformity, no swelling or effusion TTP calcaneus NTTP over fibular head, lat mal,  medial mal, achilles, navicular, base of 5th, ATFL, CFL, deltoid,   or midfoot No Pain with weightbearing No Pain with compression Test of calcaneus Ankle ROM DF 30, PF 45, inv/ev intact Negative ant drawer, talar tilt, rotation test,      Electronically signed by:  Joe Robertson [facility] 10:23 AM 01/24/23

## 2023-01-24 ENCOUNTER — Ambulatory Visit: Payer: Medicare Other | Admitting: Sports Medicine

## 2023-01-24 VITALS — HR 78 | Ht 70.0 in | Wt 208.0 lb

## 2023-01-24 DIAGNOSIS — M79641 Pain in right hand: Secondary | ICD-10-CM

## 2023-01-24 DIAGNOSIS — S62511D Displaced fracture of proximal phalanx of right thumb, subsequent encounter for fracture with routine healing: Secondary | ICD-10-CM

## 2023-01-24 DIAGNOSIS — S99911D Unspecified injury of right ankle, subsequent encounter: Secondary | ICD-10-CM

## 2023-02-03 ENCOUNTER — Emergency Department (HOSPITAL_COMMUNITY)
Admission: EM | Admit: 2023-02-03 | Discharge: 2023-02-03 | Disposition: A | Discharge status: Home / Self Care | Payer: Medicare Other | Attending: Emergency Medicine | Admitting: Emergency Medicine

## 2023-02-03 ENCOUNTER — Emergency Department (HOSPITAL_COMMUNITY): Payer: Medicare Other

## 2023-02-03 ENCOUNTER — Encounter (HOSPITAL_COMMUNITY): Payer: Self-pay

## 2023-02-03 ENCOUNTER — Other Ambulatory Visit: Payer: Self-pay

## 2023-02-03 DIAGNOSIS — M79642 Pain in left hand: Secondary | ICD-10-CM | POA: Diagnosis not present

## 2023-02-03 DIAGNOSIS — W1839XA Other fall on same level, initial encounter: Secondary | ICD-10-CM | POA: Insufficient documentation

## 2023-02-03 DIAGNOSIS — Y92093 Driveway of other non-institutional residence as the place of occurrence of the external cause: Secondary | ICD-10-CM | POA: Insufficient documentation

## 2023-02-03 DIAGNOSIS — S62349A Nondisplaced fracture of base of unspecified metacarpal bone, initial encounter for closed fracture: Secondary | ICD-10-CM

## 2023-02-03 DIAGNOSIS — S62345A Nondisplaced fracture of base of fourth metacarpal bone, left hand, initial encounter for closed fracture: Secondary | ICD-10-CM | POA: Insufficient documentation

## 2023-02-03 DIAGNOSIS — Z23 Encounter for immunization: Secondary | ICD-10-CM | POA: Insufficient documentation

## 2023-02-03 DIAGNOSIS — S0181XA Laceration without foreign body of other part of head, initial encounter: Secondary | ICD-10-CM | POA: Diagnosis not present

## 2023-02-03 DIAGNOSIS — R9431 Abnormal electrocardiogram [ECG] [EKG]: Secondary | ICD-10-CM | POA: Diagnosis not present

## 2023-02-03 DIAGNOSIS — Z7982 Long term (current) use of aspirin: Secondary | ICD-10-CM | POA: Diagnosis not present

## 2023-02-03 DIAGNOSIS — W19XXXA Unspecified fall, initial encounter: Secondary | ICD-10-CM

## 2023-02-03 DIAGNOSIS — S0121XA Laceration without foreign body of nose, initial encounter: Secondary | ICD-10-CM | POA: Diagnosis not present

## 2023-02-03 DIAGNOSIS — S80212A Abrasion, left knee, initial encounter: Secondary | ICD-10-CM | POA: Insufficient documentation

## 2023-02-03 DIAGNOSIS — Z8673 Personal history of transient ischemic attack (TIA), and cerebral infarction without residual deficits: Secondary | ICD-10-CM | POA: Insufficient documentation

## 2023-02-03 DIAGNOSIS — S199XXA Unspecified injury of neck, initial encounter: Secondary | ICD-10-CM | POA: Diagnosis not present

## 2023-02-03 DIAGNOSIS — S022XXA Fracture of nasal bones, initial encounter for closed fracture: Secondary | ICD-10-CM | POA: Diagnosis not present

## 2023-02-03 DIAGNOSIS — S62347A Nondisplaced fracture of base of fifth metacarpal bone. left hand, initial encounter for closed fracture: Secondary | ICD-10-CM | POA: Insufficient documentation

## 2023-02-03 DIAGNOSIS — S01112A Laceration without foreign body of left eyelid and periocular area, initial encounter: Secondary | ICD-10-CM | POA: Insufficient documentation

## 2023-02-03 DIAGNOSIS — R739 Hyperglycemia, unspecified: Secondary | ICD-10-CM | POA: Diagnosis not present

## 2023-02-03 DIAGNOSIS — S62655A Nondisplaced fracture of medial phalanx of left ring finger, initial encounter for closed fracture: Secondary | ICD-10-CM | POA: Diagnosis not present

## 2023-02-03 LAB — CBC
HCT: 49.2 % (ref 39.0–52.0)
Hemoglobin: 15.5 g/dL (ref 13.0–17.0)
MCH: 29.9 pg (ref 26.0–34.0)
MCHC: 31.5 g/dL (ref 30.0–36.0)
MCV: 95 fL (ref 80.0–100.0)
Platelets: 215 10*3/uL (ref 150–400)
RBC: 5.18 MIL/uL (ref 4.22–5.81)
RDW: 13.6 % (ref 11.5–15.5)
WBC: 7.7 10*3/uL (ref 4.0–10.5)
nRBC: 0 % (ref 0.0–0.2)

## 2023-02-03 LAB — CBG MONITORING, ED: Glucose-Capillary: 178 mg/dL — ABNORMAL HIGH (ref 70–99)

## 2023-02-03 LAB — BASIC METABOLIC PANEL
Anion gap: 10 (ref 5–15)
BUN: 14 mg/dL (ref 8–23)
CO2: 27 mmol/L (ref 22–32)
Calcium: 8.8 mg/dL — ABNORMAL LOW (ref 8.9–10.3)
Chloride: 101 mmol/L (ref 98–111)
Creatinine, Ser: 1.26 mg/dL — ABNORMAL HIGH (ref 0.61–1.24)
GFR, Estimated: 58 mL/min — ABNORMAL LOW (ref >60)
Glucose, Bld: 187 mg/dL — ABNORMAL HIGH (ref 70–99)
Potassium: 4.3 mmol/L (ref 3.5–5.1)
Sodium: 138 mmol/L (ref 135–145)

## 2023-02-03 MED ORDER — OXYCODONE-ACETAMINOPHEN 5-325 MG PO TABS
1.0000 | ORAL_TABLET | Freq: Once | ORAL | Status: AC
  Administered 2023-02-03: 1 via ORAL
  Filled 2023-02-03: qty 1

## 2023-02-03 MED ORDER — TETANUS-DIPHTH-ACELL PERTUSSIS 5-2.5-18.5 LF-MCG/0.5 IM SUSY
0.5000 mL | PREFILLED_SYRINGE | Freq: Once | INTRAMUSCULAR | Status: AC
  Administered 2023-02-03: 0.5 mL via INTRAMUSCULAR
  Filled 2023-02-03: qty 0.5

## 2023-02-03 MED ORDER — LIDOCAINE-EPINEPHRINE (PF) 2 %-1:200000 IJ SOLN
10.0000 mL | Freq: Once | INTRAMUSCULAR | Status: AC
  Administered 2023-02-03: 10 mL
  Filled 2023-02-03: qty 20

## 2023-02-03 NOTE — Progress Notes (Signed)
Orthopedic Tech Progress Note Patient Details:  Joe Robertson [birth year] [id number]  Ortho Devices Type of Ortho Device: Cotton web roll, Ace wrap, Ulna gutter splint Ortho Device/Splint Location: LUE Ortho Device/Splint Interventions: Ordered, Application, Adjustment   Post Interventions Patient Tolerated: Well Instructions Provided: Care of device  [name] 02/03/2023, 6:17 PM

## 2023-02-03 NOTE — ED Notes (Signed)
Pt can't provide a urine sample at this time

## 2023-02-03 NOTE — ED Triage Notes (Addendum)
Pt came in via POV d/t falling on the concrete while in his driveway. Rates his pain 10/10 in the areas of injury on his nose & above Lt eye brow. A/Ox4, NOT on thinners. Bleeding controlled but not stopped completley while in triage, pressure held by pt.

## 2023-02-03 NOTE — ED Provider Triage Note (Signed)
Emergency Medicine Provider Triage Evaluation Note  Joe Robertson , a 82 y.o. male  was evaluated in triage.  Pt complains of fall. Pt was watering his plant this AM, loss balance and fell forward striking face against ground. No LOC, not on blood thinner, no precipitating sxs prior to fall.  Endorse facial pain and pain to L hand.  No cp, sob, abd pain, knees pain. Unsure last tdap  Review of Systems  Positive: As above Negative: As above  Physical Exam  BP (!) 151/78 (BP Location: Right Arm)   Pulse (!) 104   Temp 98 F (36.7 C) (Oral)   Resp 20   SpO2 95%  Gen:   Awake, no distress   Resp:  Normal effort  MSK:   Moves extremities without difficulty  Other:  Lacerations noted to L eyebrow and bridge of nose.  Ttp L hand  Medical Decision Making  Medically screening exam initiated at 12:48 PM.  Appropriate orders placed.  [name] was informed that the remainder of the evaluation will be completed by another provider, this initial triage assessment does not replace that evaluation, and the importance of remaining in the ED until their evaluation is complete.     Fayrene Helper, PA-C 02/03/23 1254

## 2023-02-03 NOTE — ED Provider Notes (Signed)
[place] EMERGENCY DEPARTMENT AT [facility] Provider Note   CSN: [id number] Arrival date & time: 02/03/23  1205     History  Chief Complaint  Patient presents with   Joe Robertson is a 82 y.o. male.  HPI 82 year old male presents today after fall in the driveway.  He states that he was out watering the flowers.  Wife states he has a history of stroke and some memory loss.  She was there when he found there was no loss of consciousness.  He has had falls since his stroke in the past.  They were able to help him up and he was ambulatory afterwards.  He is complaining of pain to his left hand and bleeding above his left eye and on the bridge of his nose.  He has not on any blood thinners.     Home Medications Prior to Admission medications   Medication Sig Start Date End Date Taking? Authorizing Provider  acetaminophen (TYLENOL) 650 MG CR tablet Take 650 mg by mouth daily as needed.    [provider]  aspirin EC 81 MG EC tablet Take 1 tablet (81 mg total) by mouth daily. 11/03/19   Layne Benton, NP  losartan (COZAAR) 50 MG tablet TAKE 1 TABLET(50 MG) BY MOUTH DAILY 10/31/22   Burns, Bobette Mo, MD  melatonin 5 MG TABS Take 5 mg by mouth at bedtime as needed (prn).    [provider]  rosuvastatin (CRESTOR) 10 MG tablet Take 1 tablet (10 mg total) by mouth daily. 10/25/22   Pincus Sanes, MD  tamsulosin (FLOMAX) 0.4 MG CAPS capsule TAKE 1 CAPSULE(0.4 MG) BY MOUTH DAILY 10/31/22   Pincus Sanes, MD  vitamin E 1000 UNIT capsule Take 1,000 Units by mouth daily with breakfast.    [provider]      Allergies    Simvastatin and Lipitor [atorvastatin]    Review of Systems   Review of Systems  Physical Exam Updated Vital Signs BP (!) 155/84 (BP Location: Right Arm)   Pulse 89   Temp 98 F (36.7 C) (Oral)   Resp 16   SpO2 98%  Physical Exam Vitals and nursing note reviewed.  Constitutional:      Appearance: Normal appearance.   HENT:     Head:     Comments: Laceration left forehead Laceration over bridge of nose    Right Ear: External ear normal.     Left Ear: External ear normal.     Nose: Nose normal.     Mouth/Throat:     Pharynx: Oropharynx is clear.  Eyes:     Extraocular Movements: Extraocular movements intact.     Pupils: Pupils are equal, round, and reactive to light.  Cardiovascular:     Rate and Rhythm: Normal rate and regular rhythm.     Pulses: Normal pulses.     Comments: No external signs of trauma on chest wall No palpable tenderness, no crepitus Pulmonary:     Effort: Pulmonary effort is normal.  Abdominal:     General: Bowel sounds are normal.     Palpations: Abdomen is soft.  Musculoskeletal:     Cervical back: Normal range of motion.     Comments: Abrasion left knee Full active range of motion bilateral hips, knees No tenderness over pelvis or hips Right upper extremity full active range of motion without obvious signs of trauma or tenderness Some tenderness over the left lateral hand, no abrasions  or open wounds noted  Skin:    General: Skin is warm and dry.     Capillary Refill: Capillary refill takes less than 2 seconds.  Neurological:     General: No focal deficit present.     Mental Status: He is alert. Mental status is at baseline.     Cranial Nerves: No cranial nerve deficit.     Motor: No weakness.     Coordination: Coordination normal.     Gait: Gait normal.  Psychiatric:        Mood and Affect: Mood normal.        Behavior: Behavior normal.     ED Results / Procedures / Treatments   Labs (all labs ordered are listed, but only abnormal results are displayed) Labs Reviewed  BASIC METABOLIC PANEL - Abnormal; Notable for the following components:      Result Value   Glucose, Bld 187 (*)    Creatinine, Ser 1.26 (*)    Calcium 8.8 (*)    GFR, Estimated 58 (*)    All other components within normal limits  CBG MONITORING, ED - Abnormal; Notable for the following  components:   Glucose-Capillary 178 (*)    All other components within normal limits  CBC  URINALYSIS, ROUTINE W REFLEX MICROSCOPIC    EKG EKG Interpretation  Date/Time:  Monday February 03 2023 13:03:58 EDT Ventricular Rate:  98 PR Interval:  150 QRS Duration: 82 QT Interval:  350 QTC Calculation: 446 R Axis:   -16 Text Interpretation: Normal sinus rhythm Inferior infarct , age undetermined Anterolateral infarct , age undetermined Abnormal ECG When compared with ECG of 30-Oct-2019 21:10, PREVIOUS ECG IS PRESENT no change from prior ekg Confirmed by Margarita Grizzle [phone]) on 02/03/2023 3:46:52 PM  Radiology CT HEAD WO CONTRAST  Result Date: 02/03/2023 CLINICAL DATA:  Fall, facial trauma EXAM: CT HEAD WITHOUT CONTRAST CT MAXILLOFACIAL WITHOUT CONTRAST CT CERVICAL SPINE WITHOUT CONTRAST TECHNIQUE: Multidetector CT imaging of the head, cervical spine, and maxillofacial structures were performed using the standard protocol without intravenous contrast. Multiplanar CT image reconstructions of the cervical spine and maxillofacial structures were also generated. RADIATION DOSE REDUCTION: This exam was performed according to the departmental dose-optimization program which includes automated exposure control, adjustment of the mA and/or kV according to patient size and/or use of iterative reconstruction technique. COMPARISON:  CTA head/neck 10/30/2019 FINDINGS: CT HEAD FINDINGS Brain: There is no acute intracranial hemorrhage, extra-axial fluid collection, or acute infarct. Parenchymal volume is within expected limits for age. The ventricles are normal in size. Gray-white differentiation is preserved. Hypodensity in the supratentorial white matter likely reflects sequela of underlying chronic small-vessel ischemic change. The pituitary and suprasellar region are normal. There is no mass lesion. There is no mass effect or midline shift. Vascular: There is calcification of the bilateral carotid siphons and  vertebral arteries. Skull: Normal. Negative for fracture or focal lesion. Other: The mastoid air cells and middle ear cavities are clear. CT MAXILLOFACIAL FINDINGS Osseous: There are acute appearing bilateral nasal bone fractures. There is no other acute facial bone fracture. There is no evidence of mandibular dislocation. There is no suspicious osseous lesion. Orbits: Bilateral lens implants are in place. There is no evidence of traumatic injury to the globes. There is no retrobulbar hematoma. Sinuses: Clear. Soft tissues: Unremarkable. CT CERVICAL SPINE FINDINGS Alignment: Normal. There is no antero or retrolisthesis. There is no jumped or perched facet or other evidence of traumatic malalignment. Skull base and vertebrae: Skull base alignment is  maintained. Vertebral body heights are preserved. There is no evidence of acute fracture. There is no suspicious osseous lesion. Soft tissues and spinal canal: No prevertebral fluid or swelling. No visible canal hematoma. Disc levels: There is disc space narrowing and degenerative endplate change C6-C7. There is overall moderate multilevel facet arthropathy. There is no evidence of high-grade spinal canal stenosis. Upper chest: The imaged lung apices are clear. Other: None. IMPRESSION: 1. No acute intracranial hemorrhage or calvarial fracture. 2. Acute appearing bilateral nasal bone fractures. 3. No acute fracture or traumatic malalignment of the cervical spine. Electronically Signed   By: Lesia Hausen M.D.   On: 02/03/2023 14:56   CT Maxillofacial Wo Contrast  Result Date: 02/03/2023 CLINICAL DATA:  Fall, facial trauma EXAM: CT HEAD WITHOUT CONTRAST CT MAXILLOFACIAL WITHOUT CONTRAST CT CERVICAL SPINE WITHOUT CONTRAST TECHNIQUE: Multidetector CT imaging of the head, cervical spine, and maxillofacial structures were performed using the standard protocol without intravenous contrast. Multiplanar CT image reconstructions of the cervical spine and maxillofacial structures  were also generated. RADIATION DOSE REDUCTION: This exam was performed according to the departmental dose-optimization program which includes automated exposure control, adjustment of the mA and/or kV according to patient size and/or use of iterative reconstruction technique. COMPARISON:  CTA head/neck 10/30/2019 FINDINGS: CT HEAD FINDINGS Brain: There is no acute intracranial hemorrhage, extra-axial fluid collection, or acute infarct. Parenchymal volume is within expected limits for age. The ventricles are normal in size. Gray-white differentiation is preserved. Hypodensity in the supratentorial white matter likely reflects sequela of underlying chronic small-vessel ischemic change. The pituitary and suprasellar region are normal. There is no mass lesion. There is no mass effect or midline shift. Vascular: There is calcification of the bilateral carotid siphons and vertebral arteries. Skull: Normal. Negative for fracture or focal lesion. Other: The mastoid air cells and middle ear cavities are clear. CT MAXILLOFACIAL FINDINGS Osseous: There are acute appearing bilateral nasal bone fractures. There is no other acute facial bone fracture. There is no evidence of mandibular dislocation. There is no suspicious osseous lesion. Orbits: Bilateral lens implants are in place. There is no evidence of traumatic injury to the globes. There is no retrobulbar hematoma. Sinuses: Clear. Soft tissues: Unremarkable. CT CERVICAL SPINE FINDINGS Alignment: Normal. There is no antero or retrolisthesis. There is no jumped or perched facet or other evidence of traumatic malalignment. Skull base and vertebrae: Skull base alignment is maintained. Vertebral body heights are preserved. There is no evidence of acute fracture. There is no suspicious osseous lesion. Soft tissues and spinal canal: No prevertebral fluid or swelling. No visible canal hematoma. Disc levels: There is disc space narrowing and degenerative endplate change C6-C7. There  is overall moderate multilevel facet arthropathy. There is no evidence of high-grade spinal canal stenosis. Upper chest: The imaged lung apices are clear. Other: None. IMPRESSION: 1. No acute intracranial hemorrhage or calvarial fracture. 2. Acute appearing bilateral nasal bone fractures. 3. No acute fracture or traumatic malalignment of the cervical spine. Electronically Signed   By: Lesia Hausen M.D.   On: 02/03/2023 14:56   CT Cervical Spine Wo Contrast  Result Date: 02/03/2023 CLINICAL DATA:  Fall, facial trauma EXAM: CT HEAD WITHOUT CONTRAST CT MAXILLOFACIAL WITHOUT CONTRAST CT CERVICAL SPINE WITHOUT CONTRAST TECHNIQUE: Multidetector CT imaging of the head, cervical spine, and maxillofacial structures were performed using the standard protocol without intravenous contrast. Multiplanar CT image reconstructions of the cervical spine and maxillofacial structures were also generated. RADIATION DOSE REDUCTION: This exam was performed according to the  departmental dose-optimization program which includes automated exposure control, adjustment of the mA and/or kV according to patient size and/or use of iterative reconstruction technique. COMPARISON:  CTA head/neck 10/30/2019 FINDINGS: CT HEAD FINDINGS Brain: There is no acute intracranial hemorrhage, extra-axial fluid collection, or acute infarct. Parenchymal volume is within expected limits for age. The ventricles are normal in size. Gray-white differentiation is preserved. Hypodensity in the supratentorial white matter likely reflects sequela of underlying chronic small-vessel ischemic change. The pituitary and suprasellar region are normal. There is no mass lesion. There is no mass effect or midline shift. Vascular: There is calcification of the bilateral carotid siphons and vertebral arteries. Skull: Normal. Negative for fracture or focal lesion. Other: The mastoid air cells and middle ear cavities are clear. CT MAXILLOFACIAL FINDINGS Osseous: There are acute  appearing bilateral nasal bone fractures. There is no other acute facial bone fracture. There is no evidence of mandibular dislocation. There is no suspicious osseous lesion. Orbits: Bilateral lens implants are in place. There is no evidence of traumatic injury to the globes. There is no retrobulbar hematoma. Sinuses: Clear. Soft tissues: Unremarkable. CT CERVICAL SPINE FINDINGS Alignment: Normal. There is no antero or retrolisthesis. There is no jumped or perched facet or other evidence of traumatic malalignment. Skull base and vertebrae: Skull base alignment is maintained. Vertebral body heights are preserved. There is no evidence of acute fracture. There is no suspicious osseous lesion. Soft tissues and spinal canal: No prevertebral fluid or swelling. No visible canal hematoma. Disc levels: There is disc space narrowing and degenerative endplate change C6-C7. There is overall moderate multilevel facet arthropathy. There is no evidence of high-grade spinal canal stenosis. Upper chest: The imaged lung apices are clear. Other: None. IMPRESSION: 1. No acute intracranial hemorrhage or calvarial fracture. 2. Acute appearing bilateral nasal bone fractures. 3. No acute fracture or traumatic malalignment of the cervical spine. Electronically Signed   By: Lesia Hausen M.D.   On: 02/03/2023 14:56   DG Hand Complete Left  Result Date: 02/03/2023 CLINICAL DATA:  Fall with pain across left hand along the second through fifth MCP joints and metacarpals. EXAM: LEFT HAND - COMPLETE 3+ VIEW COMPARISON:  None Available. FINDINGS: There are nondisplaced fractures of the bases of the ring and little finger metacarpals without definite intra-articular extension. No definite fractures are seen of the index or middle finger metacarpals. There is no evidence of dislocation. There is positive ulnar variance. Alignment is otherwise normal. There is moderate degenerative change of the thumb and index finger CMC joints. The other joint  spaces are preserved. There is no erosive change. The soft tissues are unremarkable. IMPRESSION: 1. Acute nondisplaced fractures of the bases of the ring and little finger metacarpals without definite intra-articular extension. No definite fractures of the index or middle finger metacarpals. 2. 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 Electronically Signed   By: Lesia Hausen M.D.   On: 02/03/2023 14:09    Procedures .Marland KitchenLaceration Repair  Date/Time: 02/03/2023 4:34 PM  Performed by: Margarita Grizzle, MD Authorized by: Margarita Grizzle, MD   Consent:    Consent obtained:  Verbal   Consent given by:  Patient and spouse   Risks, benefits, and alternatives were discussed: yes     Risks discussed:  Infection, pain, need for additional repair and retained foreign body Universal protocol:    Immediately prior to procedure, a time out was called: yes     Patient identity confirmed:  Verbally with patient Anesthesia:  Anesthesia method:  Local infiltration   Local anesthetic:  Lidocaine 1% WITH epi Laceration details:    Location:  Face   Face location:  Nose   Length (cm):  3   Depth (mm):  2 Pre-procedure details:    Preparation:  Patient was prepped and draped in usual sterile fashion and imaging obtained to evaluate for foreign bodies Exploration:    Limited defect created (wound extended): no     Hemostasis achieved with:  Direct pressure   Imaging obtained: x-Bianco Cange     Imaging outcome: foreign body not noted     Wound exploration: wound explored through full range of motion and entire depth of wound visualized     Contaminated: no   Treatment:    Area cleansed with:  Saline   Amount of cleaning:  Standard   Irrigation solution:  Sterile water   Irrigation method:  Syringe and pressure wash   Visualized foreign bodies/material removed: no     Debridement:  None Skin repair:    Repair method:  Sutures   Suture size:  4-0   Suture material:  Prolene    Suture technique:  Simple interrupted   Number of sutures:  2 Approximation:    Approximation:  Close Repair type:    Repair type:  Simple Post-procedure details:    Dressing:  Antibiotic ointment .Marland KitchenLaceration Repair  Date/Time: 02/03/2023 4:36 PM  Performed by: Margarita Grizzle, MD Authorized by: Margarita Grizzle, MD   Consent:    Consent obtained:  Verbal   Consent given by:  Patient and spouse   Risks, benefits, and alternatives were discussed: yes     Risks discussed:  Infection, pain and retained foreign body Universal protocol:    Immediately prior to procedure, a time out was called: yes     Patient identity confirmed:  Verbally with patient Anesthesia:    Anesthesia method:  Local infiltration Laceration details:    Location:  Face   Face location:  Forehead   Length (cm):  3   Depth (mm):  2 Pre-procedure details:    Preparation:  Patient was prepped and draped in usual sterile fashion and imaging obtained to evaluate for foreign bodies Exploration:    Limited defect created (wound extended): no     Imaging obtained: x-Adelisa Satterwhite     Imaging outcome: foreign body not noted     Wound exploration: wound explored through full range of motion and entire depth of wound visualized     Contaminated: no   Treatment:    Area cleansed with:  Soap and water   Amount of cleaning:  Standard   Irrigation solution:  Sterile water   Irrigation method:  Syringe   Visualized foreign bodies/material removed: no     Debridement:  None   Undermining:  None Skin repair:    Repair method:  Sutures   Suture size:  4-0   Suture material:  Prolene   Suture technique:  Simple interrupted   Number of sutures:  3 Approximation:    Approximation:  Close Repair type:    Repair type:  Simple Post-procedure details:    Dressing:  Antibiotic ointment   Procedure completion:  Tolerated .Marland KitchenLaceration Repair  Date/Time: 02/03/2023 4:38 PM  Performed by: Margarita Grizzle, MD Authorized by: Margarita Grizzle, MD   Consent:    Consent obtained:  Verbal   Consent given by:  Patient and spouse   Risks, benefits, and alternatives were discussed: yes     Risks discussed:  Infection, retained foreign body and pain   Alternatives discussed:  No treatment Universal protocol:    Immediately prior to procedure, a time out was called: yes     Patient identity confirmed:  Verbally with patient Anesthesia:    Anesthesia method:  Local infiltration   Local anesthetic:  Lidocaine 1% WITH epi Laceration details:    Location:  Face   Face location:  L eyebrow   Length (cm):  8   Depth (mm):  2 Exploration:    Limited defect created (wound extended): no     Imaging obtained: x-Tyree Vandruff     Imaging outcome: foreign body not noted     Wound exploration: wound explored through full range of motion and entire depth of wound visualized     Contaminated: no   Treatment:    Area cleansed with:  Soap and water   Amount of cleaning:  Extensive   Irrigation solution:  Sterile water   Irrigation method:  Syringe   Visualized foreign bodies/material removed: no     Debridement:  None Skin repair:    Repair method:  Sutures   Suture size:  4-0   Suture material:  Prolene   Suture technique:  Simple interrupted   Number of sutures:  5 Approximation:    Approximation:  Close Repair type:    Repair type:  Simple Post-procedure details:    Dressing:  Antibiotic ointment   Procedure completion:  Tolerated .Marland KitchenLaceration Repair  Date/Time: 02/03/2023 4:40 PM  Performed by: Margarita Grizzle, MD Authorized by: Margarita Grizzle, MD   Consent:    Consent obtained:  Verbal   Consent given by:  Patient and spouse   Risks discussed:  Infection, pain and retained foreign body   Alternatives discussed:  No treatment Universal protocol:    Immediately prior to procedure, a time out was called: yes     Patient identity confirmed:  Verbally with patient Anesthesia:    Anesthesia method:  Local infiltration   Local  anesthetic:  Lidocaine 1% WITH epi Laceration details:    Location:  Face   Length (cm):  2   Depth (mm):  4 Pre-procedure details:    Preparation:  Patient was prepped and draped in usual sterile fashion and imaging obtained to evaluate for foreign bodies Exploration:    Limited defect created (wound extended): no     Imaging obtained: x-Norina Cowper     Imaging outcome: foreign body not noted     Contaminated: no   Treatment:    Area cleansed with:  Soap and water   Amount of cleaning:  Standard   Irrigation solution:  Sterile water   Irrigation method:  Pressure wash   Visualized foreign bodies/material removed: no     Debridement:  None   Undermining:  None   Scar revision: no   Skin repair:    Repair method:  Sutures   Suture material:  Prolene   Suture technique:  Simple interrupted   Number of sutures:  2 Approximation:    Approximation:  Close Repair type:    Repair type:  Simple Post-procedure details:    Dressing:  Antibiotic ointment   Procedure completion:  Tolerated     Medications Ordered in ED Medications  Tdap (BOOSTRIX) injection 0.5 mL (has no administration in time range)  lidocaine-EPINEPHrine (XYLOCAINE W/EPI) 2 %-1:200000 (PF) injection 10 mL (has no administration in time range)  oxyCODONE-acetaminophen (PERCOCET/ROXICET) 5-325 MG per tablet 1 tablet (1 tablet Oral Given 02/03/23 1308)  ED Course/ Medical Decision Making/ A&P Clinical Course as of 02/03/23 1642  Mon Feb 03, 2023  1552 CBC reviewed interpreted and within normal limits [DR]  1552 Basic metabolic panel reviewed interpreted significant for hyperglycemia with glucose of 187 with normal CO2 no evidence of DKA Patient without history of diabetes Creatinine elevated 1.26, similar to prior [DR]    Clinical Course User Index [DR] Margarita Grizzle, MD                             Medical Decision Making Risk Prescription drug management.   82 year old male history of stroke, not on blood  thinners presents today with fall. Patient is unable to give definitive history.  However, patient wife was nearby and witnessed fall and there is not appear to be any significant loss of conscious.  He was able to ambulate afterwards. Here in the ED he was evaluated with labs and imaging X-Beatrix Breece left hand reviewed interpreted significant for fracture of proximal aspect over fourth and fifth metacarpal without significant displacement CT of head, face, and neck without any evidence of acute fracture or other intracranial abnormality Hand x-Shelisa Fern Discussed with Earney Hamburg, on-call for hand surgery.  He advises ulnar gutter splint and to follow-up with Dr. Yehuda Budd within the next week Advised wife that he will need to have his blood sugar and creatinine rechecked and to make sure he drinks plenty of water Plan repair of facial lacerations with sutures to be removed in 10 to 14 days Patient received tetanus while here in the department         Final Clinical Impression(s) / ED Diagnoses Final diagnoses:  Fall, initial encounter  Facial laceration, initial encounter  Closed nondisplaced fracture of base of metacarpal bone, unspecified fracture morphology, unspecified metacarpal, initial encounter    Rx / DC Orders ED Discharge Orders          Ordered    Splint application        02/03/23 1556              Margarita Grizzle, MD 02/03/23 1644

## 2023-02-03 NOTE — Discharge Instructions (Addendum)
Call Dr. Yehuda Budd' office for follow up of hand fracture in 1-10 days. Please keep wounds clean and dry. Recheck if any signs or symptoms of infection such as redness, pain or discharge Please have your blood sugar and creatinine rechecked at your doctor this week. Drink plenty of water

## 2023-02-04 ENCOUNTER — Ambulatory Visit: Payer: Medicare Other | Admitting: Sports Medicine

## 2023-02-04 VITALS — Ht 70.0 in | Wt 208.0 lb

## 2023-02-04 DIAGNOSIS — S62347A Nondisplaced fracture of base of fifth metacarpal bone. left hand, initial encounter for closed fracture: Secondary | ICD-10-CM

## 2023-02-04 DIAGNOSIS — M79642 Pain in left hand: Secondary | ICD-10-CM

## 2023-02-04 DIAGNOSIS — S62345A Nondisplaced fracture of base of fourth metacarpal bone, left hand, initial encounter for closed fracture: Secondary | ICD-10-CM

## 2023-02-04 NOTE — Progress Notes (Signed)
[name] [address] [place] [address] Phone: [phone]   Assessment and Plan:     1. Left hand pain 2. Closed nondisplaced fracture of base of fourth metacarpal bone of left hand, initial encounter 3. Closed nondisplaced fracture of base of fifth metacarpal bone of left hand, initial encounter  -Acute, initial sports medicine visit - Patient presents with new onset nondisplaced fracture of base of left fourth and fifth metacarpal.  Patient was evaluated at ER yesterday 02/03/2023 where fractures were found and was placed in ulnar gutter splint.  He is neurovascularly intact on today's visit - Recommend follow-up in 1 week for repeat hand x-ray.  Could continue to use ulnar gutter splint, or could consider transition to cast versus exocast - Start Tylenol 500 to 1000 mg tablets 2-3 times a day for day-to-day pain relief - Continue elevation of left upper extremity - May use Percocet prescribed by ER for severe breakthrough pain, though advised this medication may cause dizziness or drowsiness -Briefly discussed that this is patient's third fracture event within the past year which has included clavicle, right first proximal phalanx, left fourth and fifth metacarpal.  While these do not represent fragility fracture sites, we could evaluate patient for osteoporosis with DEXA scan.  Will discuss more at follow-up visit, though recommend starting calcium 1000 to 1200 mg daily and vitamin D 600 to 800 IU daily  Pertinent previous records reviewed include ER visit 02/03/2023, left hand x-ray 02/03/2023, CT head, maxillofacial, and C-spine 02/03/2023   Follow Up: 1 week for reevaluation for repeat hand x-ray.  Follow-up in 4 weeks with me for reevaluation   Subjective:   I, Joe Robertson, am serving as a Neurosurgeon for Doctor Richardean Sale  Chief Complaint: left hand pain   HPI:   02/04/23 Patient is a 82 year old male complaining  of hand pain. Patient states ED 02/03/2023 82 year old male presents today after fall in the driveway. He states that he was out watering the flowers. Wife states he has a history of stroke and some memory loss. She was there when he found there was no loss of consciousness. He has had falls since his stroke in the past. They were able to help him up and he was ambulatory afterwards. He is complaining of pain to his left hand and bleeding above his left eye and on the bridge of his nose. He has not on any blood thinners. 1 w  Relevant Historical Information: Hypertension, multiple fractures in the past year  Additional pertinent review of systems negative.   Current Outpatient Medications:    acetaminophen (TYLENOL) 650 MG CR tablet, Take 650 mg by mouth daily as needed., Disp: , Rfl:    aspirin EC 81 MG EC tablet, Take 1 tablet (81 mg total) by mouth daily., Disp:  , Rfl:    losartan (COZAAR) 50 MG tablet, TAKE 1 TABLET(50 MG) BY MOUTH DAILY, Disp: 90 tablet, Rfl: 1   melatonin 5 MG TABS, Take 5 mg by mouth at bedtime as needed (prn)., Disp: , Rfl:    rosuvastatin (CRESTOR) 10 MG tablet, Take 1 tablet (10 mg total) by mouth daily., Disp: 90 tablet, Rfl: 3   tamsulosin (FLOMAX) 0.4 MG CAPS capsule, TAKE 1 CAPSULE(0.4 MG) BY MOUTH DAILY, Disp: 30 capsule, Rfl: 3   vitamin E 1000 UNIT capsule, Take 1,000 Units by mouth daily with breakfast., Disp: , Rfl:    Objective:     Vitals:  02/04/23 0841  Weight: 208 lb (94.3 kg)  Height: 5\' 10"  (1.778 m)      Body mass index is 29.84 kg/m.    Physical Exam:    General: Appears well, nad, nontoxic and pleasant Neuro:sensation intact, strength is 5/5 with df/pf/inv/ev, muscle tone wnl Skin:no susupicious lesions or rashes  Left hand/wrist:   No deformity Wrist in ulnar gutter splint Able to move all 5 digits.  Sensation and capillary refill intact in all 5 digits Wrist ROM limited by splint Bruising through palm    Electronically signed  by:  [name] 9:44 AM 02/04/23

## 2023-02-04 NOTE — Patient Instructions (Addendum)
1 week follow up with Dr. Denyse Amass for repeat xray  Start calcium 1000-1200 mg daily  Vitamin D 600-800 Iu daily  Elevate your hand , and keep in sling at all times

## 2023-02-06 ENCOUNTER — Encounter: Payer: Self-pay | Admitting: Sports Medicine

## 2023-02-06 NOTE — Progress Notes (Signed)
Subjective:    Patient ID: Joe Robertson, male    DOB: 1941-06-22, 82 y.o.   MRN: [id number]     HPI Joe Robertson is here for follow up of his chronic medical problems.  He is here with his wife who provides most of the history because of his decreased memory  Recent fall at home-hand fracture, facial laceration - sutures to be removed in 10-14 days (6/27- 7/1)  Kidney function slightly decreased   Taking vitamin B12  Left sided rib pain since fall.  Sharp pain at times.  No SOB.  Increased pain with movement  Medications and allergies reviewed with patient and updated if appropriate.  Current Outpatient Medications on File Prior to Visit  Medication Sig Dispense Refill   acetaminophen (TYLENOL) 650 MG CR tablet Take 650 mg by mouth daily as needed.     aspirin EC 81 MG EC tablet Take 1 tablet (81 mg total) by mouth daily.     losartan (COZAAR) 50 MG tablet TAKE 1 TABLET(50 MG) BY MOUTH DAILY 90 tablet 1   melatonin 5 MG TABS Take 5 mg by mouth at bedtime as needed (prn).     rosuvastatin (CRESTOR) 10 MG tablet Take 1 tablet (10 mg total) by mouth daily. 90 tablet 3   tamsulosin (FLOMAX) 0.4 MG CAPS capsule TAKE 1 CAPSULE(0.4 MG) BY MOUTH DAILY 30 capsule 3   vitamin E 1000 UNIT capsule Take 1,000 Units by mouth daily with breakfast.     No current facility-administered medications on file prior to visit.     Review of Systems  Constitutional:  Negative for fever.  HENT:  Negative for congestion.   Eyes:  Negative for visual disturbance.  Respiratory:  Negative for cough, shortness of breath and wheezing.   Cardiovascular:  Negative for chest pain (left ribs), palpitations and leg swelling.  Gastrointestinal:  Negative for abdominal pain.  Neurological:  Negative for dizziness, light-headedness and headaches.       Objective:   Vitals:   02/07/23 0756  BP: 128/78  Pulse: 91  Temp: 98.3 F (36.8 C)  SpO2: 95%   BP Readings from Last 3 Encounters:  02/07/23  128/78  02/03/23 (!) 150/78  08/27/22 138/72   Wt Readings from Last 3 Encounters:  02/07/23 206 lb 12.8 oz (93.8 kg)  02/04/23 208 lb (94.3 kg)  01/24/23 208 lb (94.3 kg)   Body mass index is 29.67 kg/m.    Physical Exam Constitutional:      General: He is not in acute distress.    Appearance: Normal appearance. He is not ill-appearing.  HENT:     Head: Normocephalic.     Comments: Bruising around left orbit.  Several small lacerations nasal bridge, left forehead above eyebrow and left temple each with 1-2 sutures-healing nicely without surrounding erythema.  No discharge or bleeding Eyes:     Conjunctiva/sclera: Conjunctivae normal.  Cardiovascular:     Rate and Rhythm: Normal rate and regular rhythm.     Heart sounds: Normal heart sounds.  Pulmonary:     Effort: Pulmonary effort is normal. No respiratory distress.     Breath sounds: Normal breath sounds. No wheezing or rales.  Musculoskeletal:        General: Tenderness (Left-sided anterior rib pain with palpation and movement) present.     Right lower leg: No edema.     Left lower leg: No edema.  Skin:    General: Skin is warm and dry.  Findings: No rash.  Neurological:     Mental Status: He is alert. Mental status is at baseline.  Psychiatric:        Mood and Affect: Mood normal.        Lab Results  Component Value Date   WBC 7.7 02/03/2023   HGB 15.5 02/03/2023   HCT 49.2 02/03/2023   PLT 215 02/03/2023   GLUCOSE 187 (H) 02/03/2023   CHOL 150 08/27/2022   TRIG 99.0 08/27/2022   HDL 40.80 08/27/2022   LDLDIRECT 147.0 02/02/2018   LDLCALC 90 08/27/2022   ALT 18 08/27/2022   AST 21 08/27/2022   NA 138 02/03/2023   K 4.3 02/03/2023   CL 101 02/03/2023   CREATININE 1.26 (H) 02/03/2023   BUN 14 02/03/2023   CO2 27 02/03/2023   TSH 2.05 08/27/2022   INR 1.0 10/30/2019   HGBA1C 5.7 08/27/2022     Assessment & Plan:    See Problem List for Assessment and Plan of chronic medical problems.

## 2023-02-06 NOTE — Patient Instructions (Addendum)
      Blood work was ordered.   Have an xray of your ribs today.    Medications changes include :   None      Return in about 6 months (around 08/09/2023) for Physical Exam, needs appt 6/27 or 6/28 for suture removal.

## 2023-02-07 ENCOUNTER — Ambulatory Visit (INDEPENDENT_AMBULATORY_CARE_PROVIDER_SITE_OTHER): Payer: Medicare Other

## 2023-02-07 ENCOUNTER — Encounter: Payer: Self-pay | Admitting: Internal Medicine

## 2023-02-07 ENCOUNTER — Ambulatory Visit (INDEPENDENT_AMBULATORY_CARE_PROVIDER_SITE_OTHER): Payer: Medicare Other | Admitting: Internal Medicine

## 2023-02-07 VITALS — BP 128/78 | HR 91 | Temp 98.3°F | Ht 70.0 in | Wt 206.8 lb

## 2023-02-07 DIAGNOSIS — R0789 Other chest pain: Secondary | ICD-10-CM | POA: Diagnosis not present

## 2023-02-07 DIAGNOSIS — I10 Essential (primary) hypertension: Secondary | ICD-10-CM

## 2023-02-07 DIAGNOSIS — E782 Mixed hyperlipidemia: Secondary | ICD-10-CM | POA: Diagnosis not present

## 2023-02-07 DIAGNOSIS — R0781 Pleurodynia: Secondary | ICD-10-CM

## 2023-02-07 DIAGNOSIS — R7303 Prediabetes: Secondary | ICD-10-CM

## 2023-02-07 DIAGNOSIS — E538 Deficiency of other specified B group vitamins: Secondary | ICD-10-CM | POA: Insufficient documentation

## 2023-02-07 DIAGNOSIS — S0990XA Unspecified injury of head, initial encounter: Secondary | ICD-10-CM | POA: Insufficient documentation

## 2023-02-07 DIAGNOSIS — E559 Vitamin D deficiency, unspecified: Secondary | ICD-10-CM

## 2023-02-07 DIAGNOSIS — Z0000 Encounter for general adult medical examination without abnormal findings: Secondary | ICD-10-CM

## 2023-02-07 DIAGNOSIS — S0191XA Laceration without foreign body of unspecified part of head, initial encounter: Secondary | ICD-10-CM | POA: Insufficient documentation

## 2023-02-07 DIAGNOSIS — N400 Enlarged prostate without lower urinary tract symptoms: Secondary | ICD-10-CM

## 2023-02-07 LAB — COMPREHENSIVE METABOLIC PANEL
ALT: 16 U/L (ref 0–53)
AST: 16 U/L (ref 0–37)
Albumin: 4.3 g/dL (ref 3.5–5.2)
Alkaline Phosphatase: 65 U/L (ref 39–117)
BUN: 13 mg/dL (ref 6–23)
CO2: 28 mEq/L (ref 19–32)
Calcium: 9.5 mg/dL (ref 8.4–10.5)
Chloride: 104 mEq/L (ref 96–112)
Creatinine, Ser: 1.16 mg/dL (ref 0.40–1.50)
GFR: 59.41 mL/min — ABNORMAL LOW (ref >60.00)
Glucose, Bld: 98 mg/dL (ref 70–99)
Potassium: 4.5 mEq/L (ref 3.5–5.1)
Sodium: 141 mEq/L (ref 135–145)
Total Bilirubin: 1.1 mg/dL (ref 0.2–1.2)
Total Protein: 7.7 g/dL (ref 6.0–8.3)

## 2023-02-07 LAB — LIPID PANEL
Cholesterol: 118 mg/dL (ref 0–200)
HDL: 38.7 mg/dL — ABNORMAL LOW (ref >39.00)
LDL Cholesterol: 59 mg/dL (ref 0–99)
NonHDL: 79.55
Total CHOL/HDL Ratio: 3
Triglycerides: 102 mg/dL (ref 0.0–149.0)
VLDL: 20.4 mg/dL (ref 0.0–40.0)

## 2023-02-07 LAB — HEMOGLOBIN A1C: Hgb A1c MFr Bld: 5.5 % (ref 4.6–6.5)

## 2023-02-07 LAB — VITAMIN B12: Vitamin B-12: 587 pg/mL (ref 211–911)

## 2023-02-07 NOTE — Assessment & Plan Note (Signed)
Chronic Regular exercise and healthy diet encouraged Check lipid panel, CMP Continue Crestor 10 mg daily 

## 2023-02-07 NOTE — Assessment & Plan Note (Addendum)
Chronic Taking vitamin D daily Continue 

## 2023-02-07 NOTE — Assessment & Plan Note (Signed)
Larey Seat last week Was seen in the ED and imaging showed nasal fractures, no intracranial bleed Several sutures placed Will return next week for removal of sutures

## 2023-02-07 NOTE — Assessment & Plan Note (Signed)
New As a result of recent fall Was not imaged i n the ED Having pain with movement Denies shortness of breath Will check x-ray of ribs/chest Continue regular Tylenol

## 2023-02-07 NOTE — Assessment & Plan Note (Signed)
Chronic Controlled, Stable Continue Flomax 0.4 mg daily 

## 2023-02-07 NOTE — Assessment & Plan Note (Signed)
Chronic Blood pressure well controlled CMP Continue losartan 50 mg daily 

## 2023-02-07 NOTE — Assessment & Plan Note (Addendum)
Noted with last blood work Taking B12 daily-not sure of dose Check B12 level

## 2023-02-07 NOTE — Assessment & Plan Note (Signed)
Chronic Check a1c Low sugar / carb diet Stressed regular exercise  

## 2023-02-10 ENCOUNTER — Telehealth: Payer: Self-pay

## 2023-02-10 DIAGNOSIS — H26493 Other secondary cataract, bilateral: Secondary | ICD-10-CM | POA: Diagnosis not present

## 2023-02-10 NOTE — Telephone Encounter (Signed)
Transition Care Management Follow-up Telephone Call Date of discharge and from where: [facility] 6/17 How have you been since you were released from the hospital? Doing good but sore Any questions or concerns? No  Items Reviewed: Did the pt receive and understand the discharge instructions provided? Yes  Medications obtained and verified? Yes  Other? No  Any new allergies since your discharge? No  Dietary orders reviewed? No Do you have support at home? Yes    Follow up appointments reviewed:  PCP Hospital f/u appt confirmed? Yes  Scheduled to see  on  @ . Specialist Hospital f/u appt confirmed? Yes  Scheduled to see  on  @ . Are transportation arrangements needed? No  If their condition worsens, is the pt aware to call PCP or go to the Emergency Dept.? Yes Was the patient provided with contact information for the PCP's office or ED? Yes Was to pt encouraged to call back with questions or concerns? Yes

## 2023-02-11 ENCOUNTER — Ambulatory Visit: Payer: Medicare Other | Admitting: Family Medicine

## 2023-02-11 ENCOUNTER — Ambulatory Visit (INDEPENDENT_AMBULATORY_CARE_PROVIDER_SITE_OTHER): Payer: Medicare Other

## 2023-02-11 VITALS — BP 132/78 | HR 88 | Ht 70.0 in

## 2023-02-11 DIAGNOSIS — M19042 Primary osteoarthritis, left hand: Secondary | ICD-10-CM | POA: Diagnosis not present

## 2023-02-11 DIAGNOSIS — M79642 Pain in left hand: Secondary | ICD-10-CM

## 2023-02-11 DIAGNOSIS — S62345A Nondisplaced fracture of base of fourth metacarpal bone, left hand, initial encounter for closed fracture: Secondary | ICD-10-CM | POA: Diagnosis not present

## 2023-02-11 DIAGNOSIS — S6292XD Unspecified fracture of left wrist and hand, subsequent encounter for fracture with routine healing: Secondary | ICD-10-CM | POA: Diagnosis not present

## 2023-02-11 DIAGNOSIS — S2232XA Fracture of one rib, left side, initial encounter for closed fracture: Secondary | ICD-10-CM

## 2023-02-11 DIAGNOSIS — H524 Presbyopia: Secondary | ICD-10-CM | POA: Diagnosis not present

## 2023-02-11 DIAGNOSIS — S62347A Nondisplaced fracture of base of fifth metacarpal bone. left hand, initial encounter for closed fracture: Secondary | ICD-10-CM | POA: Diagnosis not present

## 2023-02-11 NOTE — Patient Instructions (Signed)
Thank you for coming in today.   Use the cast.   Use a throw pillow for broken rib pain with coughing or sneezing.   Consider a bed assist ladder on [employer].   Recheck with Dr Jean Rosenthal or Dr Denyse Amass in 2 weeks.   Let me know sooner if you have a problem.   Please get an Xray today before you leave

## 2023-02-11 NOTE — Progress Notes (Signed)
Rubin Payor, PhD, LAT, ATC acting as a scribe for Clementeen Graham, MD.  Joe Robertson is a 82 y.o. male who presents to [facility] at [facility] today for cont'd L hand pain due to fx of the base of the 4th-5th [facility]. He suffered a fall in his driveway while out watering his flowers. He was seen at the [facility] ED following the fall. Pt was last seen by Dr. Jean Rosenthal on 02/04/23 and was advised to cont ulnar gutter splint.  Today, pt reports L hand pain continues w/ severe bruising and swelling. He's been compliant w/ ulnar gutter splint. His wife confirms that his behavior is normal for him; no HA, dizziness, balance problems, or tinnitius.   He notes continued left chest wall pain.  He had a rib fracture visible on chest x-ray seen in the emergency room on June 17.  He notes pain with deep inspiration and trunk motion.  Dx imaging: 02/03/23 L hand XR  Pertinent review of systems: No fevers or chills  Relevant historical information: History of a stroke Mild cognitive impairment present.  Exam:  BP 132/78   Pulse 88   Ht 5\' 10"  (1.778 m)   SpO2 96%   BMI 29.67 kg/m  General: Well Developed, well nourished, and in no acute distress.   MSK: Right hand significant bruising and swelling.  Mildly tender palpation base of the ulnar side of the hand.  Mild hand motion is intact.  No scissoring of the digits.  Left chest wall tender to palpation.  Lab and Radiology Results  X-ray images left hand and 2 view chest x-ray obtained today personally and independently interpreted  Left hand: Stable appearance of fractures at the proximal fourth and fifth metacarpals.  No worsening angulation or displacement.  Chest x-ray: Elevated left Heema diaphragm appears stable.  Await formal radiology review    Assessment and Plan: 82 y.o. male with left hand fracture appears improving to stable.  Patient was provided with a Exos boxer's fracture cast.  He is not a great  surgical candidate so working to attempt to treat this conservatively.  He also has a rib fracture.  Chest x-ray looks stable to my view today.  Await radiology overread.  Recommend deep inspiration and splinting with a throw pillow with coughing.  Recheck in 2 weeks with myself or Dr. Jean Rosenthal.   PDMP not reviewed this encounter. Orders Placed This Encounter  Procedures   DG Hand Complete Left    Standing Status:   Future    Number of Occurrences:   1    Standing Expiration Date:   03/13/2023    Order Specific Question:   Reason for Exam (SYMPTOM  OR DIAGNOSIS REQUIRED)    Answer:   left hand pain    Order Specific Question:   Preferred imaging location?    Answer:   [facility]   DG Chest 2 View    Standing Status:   Future    Number of Occurrences:   1    Standing Expiration Date:   02/11/2024    Order Specific Question:   Reason for Exam (SYMPTOM  OR DIAGNOSIS REQUIRED)    Answer:   fu rib fractue    Order Specific Question:   Preferred imaging location?    Answer:   [facility]   No orders of the defined types were placed in this encounter.    Discussed warning signs or symptoms. Please see discharge instructions. Patient  expresses understanding.   The above documentation has been reviewed and is accurate and complete Lynne Leader, M.D.

## 2023-02-13 NOTE — Progress Notes (Signed)
    Subjective:    Patient ID: Joe Robertson, male    DOB: [birth year], 82 y.o.   MRN: [id number]      HPI Joe Robertson is here for No chief complaint on file.   Here for suture removal.  He fell 6/17 and was seen in the emergency room.  Imaging did reveal nondisplaced fracture the base of his metacarpal bone and bilateral nasal bone fractures.  He suffered multiple facial lacerations.  He did receive a tetanus.  He is seen sports medicine for the hand fracture.    He denies any headaches, lightheadedness or dizziness.  Overall doing well.  Denies any pain from the lacerations on his face and forehead.       Medications and allergies reviewed with patient and updated if appropriate.  Current Outpatient Medications on File Prior to Visit  Medication Sig Dispense Refill   acetaminophen (TYLENOL) 650 MG CR tablet Take 650 mg by mouth daily as needed.     aspirin EC 81 MG EC tablet Take 1 tablet (81 mg total) by mouth daily.     cyanocobalamin (VITAMIN B12) 1000 MCG tablet Take 1,000 mcg by mouth daily.     losartan (COZAAR) 50 MG tablet TAKE 1 TABLET(50 MG) BY MOUTH DAILY 90 tablet 1   melatonin 5 MG TABS Take 5 mg by mouth at bedtime as needed (prn).     rosuvastatin (CRESTOR) 10 MG tablet Take 1 tablet (10 mg total) by mouth daily. 90 tablet 3   tamsulosin (FLOMAX) 0.4 MG CAPS capsule TAKE 1 CAPSULE(0.4 MG) BY MOUTH DAILY 30 capsule 3   vitamin E 1000 UNIT capsule Take 1,000 Units by mouth daily with breakfast.     No current facility-administered medications on file prior to visit.    Review of Systems     Objective:   Vitals:   02/14/23 0828  BP: 118/64  Pulse: 94  Temp: 97.6 F (36.4 C)  SpO2: 95%   BP Readings from Last 3 Encounters:  02/14/23 118/64  02/11/23 132/78  02/07/23 128/78   Wt Readings from Last 3 Encounters:  02/14/23 205 lb (93 kg)  02/07/23 206 lb 12.8 oz (93.8 kg)  02/04/23 208 lb (94.3 kg)   Body mass index is 29.41 kg/m.    Physical  Exam Constitutional:      General: He is not in acute distress.    Appearance: Normal appearance. He is not ill-appearing.  HENT:     Head: Normocephalic.     Comments: Laceration nasal bridge that is healed well-no erythema, minimal scabbing Laceration between eyebrows that is healed without scabbing Laceration above left eyebrow that is well-healed-no erythema or scabbing Slight scab left temporal region-no sutures at that location Skin:    General: Skin is warm and dry.  Neurological:     Mental Status: He is alert.        Suture Removal  Date/Time: 02/14/2023 12:44 PM  Performed by: Pincus Sanes, MD Authorized by: Pincus Sanes, MD  Body area: head/neck Location details: forehead Wound Appearance: clean Sutures Removed: 9 (Nasal laceration-1 sutures Forehead laceration-3 sutures Left eyebrow laceration-5 sutures) Patient tolerance: patient tolerated the procedure well with no immediate complications         Assessment & Plan:    See Problem List for Assessment and Plan of chronic medical problems.

## 2023-02-13 NOTE — Patient Instructions (Addendum)
     Your sutures were removed today.  You can apply an antibacterial ointment or Vaseline for a few days.   Medications changes include :   None

## 2023-02-14 ENCOUNTER — Encounter: Payer: Self-pay | Admitting: Internal Medicine

## 2023-02-14 ENCOUNTER — Ambulatory Visit (INDEPENDENT_AMBULATORY_CARE_PROVIDER_SITE_OTHER): Payer: Medicare Other | Admitting: Internal Medicine

## 2023-02-14 VITALS — BP 118/64 | HR 94 | Temp 97.6°F | Ht 70.0 in | Wt 205.0 lb

## 2023-02-14 DIAGNOSIS — S0181XA Laceration without foreign body of other part of head, initial encounter: Secondary | ICD-10-CM | POA: Diagnosis not present

## 2023-02-14 DIAGNOSIS — Z4802 Encounter for removal of sutures: Secondary | ICD-10-CM | POA: Diagnosis not present

## 2023-02-14 NOTE — Assessment & Plan Note (Signed)
Acute Related to fall 6/17 No concerning symptoms regarding the fall including headaches, lightheadedness or dizziness Here for suture removal 9 sutures removed today without difficulty-he tolerated this well-5 sutures above left eyebrow, 3 sutures between eyebrows and 1 suture nasal bridge All lacerations healing well without erythema.  Minimal scabbing on nasal bridge Discussed that his wife can apply Neosporin or Vaseline for a few days Low risk for infection at this time

## 2023-02-14 NOTE — Progress Notes (Signed)
Left hand x-ray shows fractures of the metacarpal bones in the hand.  No change in alignment or displacement.  Not much healing is visible on the x-ray.

## 2023-02-17 NOTE — Progress Notes (Signed)
Chest x-ray looks okay.  No pneumonia.

## 2023-02-21 ENCOUNTER — Other Ambulatory Visit: Payer: Self-pay | Admitting: Internal Medicine

## 2023-02-27 DIAGNOSIS — H524 Presbyopia: Secondary | ICD-10-CM | POA: Diagnosis not present

## 2023-03-03 NOTE — Progress Notes (Unsigned)
Aleen Sells D.[facility] [address] [place] [address] Phone: [phone]   Assessment and Plan:     There are no diagnoses linked to this encounter.  ***   Pertinent previous records reviewed include ***   Follow Up: ***     Subjective:   I, Labaron Digirolamo, am serving as a Neurosurgeon for Doctor Richardean Sale   Chief Complaint: left hand pain    HPI:    02/04/23 Patient is a 82 year old male complaining of hand pain. Patient states ED 02/03/2023 82 year old male presents today after fall in the driveway. He states that he was out watering the flowers. Wife states he has a history of stroke and some memory loss. She was there when he found there was no loss of consciousness. He has had falls since his stroke in the past. They were able to help him up and he was ambulatory afterwards. He is complaining of pain to his left hand and bleeding above his left eye and on the bridge of his nose. He has not on any blood thinners. 1 w  02/11/2023 Markies Mowatt is a 82 y.o. male who presents to [facility] at [facility] today for cont'd L hand pain due to fx of the base of the 4th-5th [facility]. He suffered a fall in his driveway while out watering his flowers. He was seen at the [facility] ED following the fall. Pt was last seen by Dr. Jean Rosenthal on 02/04/23 and was advised to cont ulnar gutter splint.   Today, pt reports L hand pain continues w/ severe bruising and swelling. He's been compliant w/ ulnar gutter splint. His wife confirms that his behavior is normal for him; no HA, dizziness, balance problems, or tinnitius.    He notes continued left chest wall pain.  He had a rib fracture visible on chest x-ray seen in the emergency room on June 17.  He notes pain with deep inspiration and trunk motion.   Dx imaging: 02/03/23 L hand XR   Pertinent review of systems: No fevers or chills   Relevant historical information: History of a  stroke Mild cognitive impairment present  03/04/2023 Patient states    Relevant Historical Information: Hypertension, multiple fractures in the past year  Additional pertinent review of systems negative.   Current Outpatient Medications:    acetaminophen (TYLENOL) 650 MG CR tablet, Take 650 mg by mouth daily as needed., Disp: , Rfl:    aspirin EC 81 MG EC tablet, Take 1 tablet (81 mg total) by mouth daily., Disp:  , Rfl:    cyanocobalamin (VITAMIN B12) 1000 MCG tablet, Take 1,000 mcg by mouth daily., Disp: , Rfl:    losartan (COZAAR) 50 MG tablet, TAKE 1 TABLET(50 MG) BY MOUTH DAILY, Disp: 90 tablet, Rfl: 1   melatonin 5 MG TABS, Take 5 mg by mouth at bedtime as needed (prn)., Disp: , Rfl:    rosuvastatin (CRESTOR) 10 MG tablet, Take 1 tablet (10 mg total) by mouth daily., Disp: 90 tablet, Rfl: 3   tamsulosin (FLOMAX) 0.4 MG CAPS capsule, TAKE 1 CAPSULE(0.4 MG) BY MOUTH DAILY, Disp: 30 capsule, Rfl: 3   vitamin E 1000 UNIT capsule, Take 1,000 Units by mouth daily with breakfast., Disp: , Rfl:    Objective:     There were no vitals filed for this visit.    There is no height or weight on file to calculate BMI.    Physical Exam:    ***  Electronically signed by:  Aleen Sells D.[facility] 2:12 PM 03/03/23

## 2023-03-04 ENCOUNTER — Ambulatory Visit (INDEPENDENT_AMBULATORY_CARE_PROVIDER_SITE_OTHER): Payer: Medicare Other

## 2023-03-04 ENCOUNTER — Ambulatory Visit: Payer: Medicare Other | Admitting: Sports Medicine

## 2023-03-04 VITALS — HR 84 | Ht 70.0 in | Wt 205.0 lb

## 2023-03-04 DIAGNOSIS — M79642 Pain in left hand: Secondary | ICD-10-CM

## 2023-03-04 DIAGNOSIS — S62315D Displaced fracture of base of fourth metacarpal bone, left hand, subsequent encounter for fracture with routine healing: Secondary | ICD-10-CM | POA: Diagnosis not present

## 2023-03-04 DIAGNOSIS — S6292XD Unspecified fracture of left wrist and hand, subsequent encounter for fracture with routine healing: Secondary | ICD-10-CM

## 2023-03-04 DIAGNOSIS — S2232XD Fracture of one rib, left side, subsequent encounter for fracture with routine healing: Secondary | ICD-10-CM

## 2023-03-04 DIAGNOSIS — M1812 Unilateral primary osteoarthritis of first carpometacarpal joint, left hand: Secondary | ICD-10-CM | POA: Diagnosis not present

## 2023-03-04 NOTE — Patient Instructions (Signed)
Hard brace must left on at all times until re evaluation  3-4 week follow up

## 2023-03-31 NOTE — Progress Notes (Unsigned)
Joe Robertson [name] [address] [place] [address] Phone: [phone]   Assessment and Plan:     There are no diagnoses linked to this encounter.  ***   Pertinent previous records reviewed include ***   Follow Up: ***     Subjective:   I, Joe Robertson, am serving as a Neurosurgeon for Joe Robertson   Chief Complaint: left hand pain    HPI:    02/04/23 Patient is a 82 year old male complaining of hand pain. Patient states ED 02/03/2023 82 year old male presents today after fall in the driveway. He states that he was out watering the flowers. Wife states he has a history of stroke and some memory loss. She was there when he found there was no loss of consciousness. He has had falls since his stroke in the past. They were able to help him up and he was ambulatory afterwards. He is complaining of pain to his left hand and bleeding above his left eye and on the bridge of his nose. He has not on any blood thinners. 1 w   02/11/2023 Joe Robertson is a 82 y.o. male who presents to [facility] at [facility] today for cont'Joe L hand pain due to fx of the base of the 4th-5th [facility]. He suffered a fall in his driveway while out watering his flowers. He was seen at the [facility] ED following the fall. Pt was last seen by Joe Robertson on 02/04/23 and was advised to cont ulnar gutter splint.   Today, pt reports L hand pain continues w/ severe bruising and swelling. He's been compliant w/ ulnar gutter splint. His wife confirms that his behavior is normal for him; no HA, dizziness, balance problems, or tinnitius.    He notes continued left chest wall pain.  He had a rib fracture visible on chest x-ray seen in the emergency room on June 17.  He notes pain with deep inspiration and trunk motion.   Dx imaging: 02/03/23 L hand XR   Pertinent review of systems: No fevers or chills   Relevant historical information: History of a  stroke Mild cognitive impairment present   03/04/2023 Patient states that he is able to make a fist , but he does have occasional pain    04/01/2023 Patient states    Relevant Historical Information: Hypertension, multiple fractures in the past year  Additional pertinent review of systems negative.   Current Outpatient Medications:    acetaminophen (TYLENOL) 650 MG CR tablet, Take 650 mg by mouth daily as needed., Disp: , Rfl:    aspirin EC 81 MG EC tablet, Take 1 tablet (81 mg total) by mouth daily., Disp:  , Rfl:    cyanocobalamin (VITAMIN B12) 1000 MCG tablet, Take 1,000 mcg by mouth daily., Disp: , Rfl:    losartan (COZAAR) 50 MG tablet, TAKE 1 TABLET(50 MG) BY MOUTH DAILY, Disp: 90 tablet, Rfl: 1   melatonin 5 MG TABS, Take 5 mg by mouth at bedtime as needed (prn)., Disp: , Rfl:    rosuvastatin (CRESTOR) 10 MG tablet, Take 1 tablet (10 mg total) by mouth daily., Disp: 90 tablet, Rfl: 3   tamsulosin (FLOMAX) 0.4 MG CAPS capsule, TAKE 1 CAPSULE(0.4 MG) BY MOUTH DAILY, Disp: 30 capsule, Rfl: 3   vitamin E 1000 UNIT capsule, Take 1,000 Units by mouth daily with breakfast., Disp: , Rfl:    Objective:     There were no vitals filed  for this visit.    There is no height or weight on file to calculate BMI.    Physical Exam:    ***   Electronically signed by:  Joe Robertson [name] 12:31 PM 03/31/23

## 2023-04-01 ENCOUNTER — Ambulatory Visit (INDEPENDENT_AMBULATORY_CARE_PROVIDER_SITE_OTHER): Payer: Medicare Other

## 2023-04-01 ENCOUNTER — Ambulatory Visit (INDEPENDENT_AMBULATORY_CARE_PROVIDER_SITE_OTHER): Payer: Medicare Other | Admitting: Sports Medicine

## 2023-04-01 VITALS — HR 96 | Ht 70.0 in | Wt 203.0 lb

## 2023-04-01 DIAGNOSIS — M79642 Pain in left hand: Secondary | ICD-10-CM | POA: Diagnosis not present

## 2023-04-01 DIAGNOSIS — S62315D Displaced fracture of base of fourth metacarpal bone, left hand, subsequent encounter for fracture with routine healing: Secondary | ICD-10-CM | POA: Diagnosis not present

## 2023-04-01 DIAGNOSIS — S6292XD Unspecified fracture of left wrist and hand, subsequent encounter for fracture with routine healing: Secondary | ICD-10-CM | POA: Diagnosis not present

## 2023-04-03 ENCOUNTER — Encounter (INDEPENDENT_AMBULATORY_CARE_PROVIDER_SITE_OTHER): Payer: Self-pay

## 2023-04-27 ENCOUNTER — Other Ambulatory Visit: Payer: Self-pay | Admitting: Internal Medicine

## 2023-04-28 ENCOUNTER — Ambulatory Visit (INDEPENDENT_AMBULATORY_CARE_PROVIDER_SITE_OTHER): Payer: Medicare Other

## 2023-04-28 VITALS — Ht 70.0 in | Wt 203.0 lb

## 2023-04-28 DIAGNOSIS — Z0000 Encounter for general adult medical examination without abnormal findings: Secondary | ICD-10-CM | POA: Diagnosis not present

## 2023-04-28 NOTE — Patient Instructions (Signed)
Joe Robertson , Thank you for taking time to come for your Medicare Wellness Visit. I appreciate your ongoing commitment to your health goals. Please review the following plan we discussed and let me know if I can assist you in the future.   Referrals/Orders/Follow-Ups/Clinician Recommendations: No  This is a list of the screening recommended for you and due dates:  Health Maintenance  Topic Date Due   Zoster (Shingles) Vaccine (1 of 2) Never done   Flu Shot  03/20/2023   COVID-19 Vaccine (4 - 2023-24 season) 04/20/2023   Medicare Annual Wellness Visit  04/27/2024   DTaP/Tdap/Td vaccine (3 - Td or Tdap) 02/02/2033   Pneumonia Vaccine  Completed   HPV Vaccine  Aged Out    Advanced directives: (Copy Requested) Please bring a copy of your health care power of attorney and living will to the office to be added to your chart at your convenience.  Next Medicare Annual Wellness Visit scheduled for next year: Yes

## 2023-04-28 NOTE — Progress Notes (Signed)
Subjective:   Joe Robertson is a 82 y.o. male who presents for Medicare Annual/Subsequent preventive examination.  Visit Complete: Virtual  I connected with  Joe Robertson on 04/28/23 by a audio enabled telemedicine application and verified that I am speaking with the correct person using two identifiers.  Patient Location: Home  Provider Location: Office/Clinic  I discussed the limitations of evaluation and management by telemedicine. The patient expressed understanding and agreed to proceed.  Vital Signs: Because this visit was a virtual/telehealth visit, some criteria may be missing or patient reported. Any vitals not documented were not able to be obtained and vitals that have been documented are patient reported.  Patient provided weight for this visit.   Review of Systems     Cardiac Risk Factors include: advanced age (>85men, >52 women);dyslipidemia;family history of premature cardiovascular disease;hypertension;male gender     Objective:    Today's Vitals   04/28/23 1403  Weight: 203 lb (92.1 kg)  Height: 5\' 10"  (1.778 m)  PainSc: 0-No pain   Body mass index is 29.13 kg/m.     04/28/2023    2:06 PM 02/03/2023   12:36 PM 04/24/2022    8:50 AM 03/05/2022    3:35 PM 04/18/2021    9:40 AM 11/10/2019    6:42 PM 10/30/2019    9:20 PM  Advanced Directives  Does Patient Have a Medical Advance Directive? Yes No Yes Yes Yes No No  Type of Estate agent of [place];Living will  Healthcare Power of [place];Living will Healthcare Power of Robertson;Living will Living will;Healthcare Power of Attorney    Does patient want to make changes to medical advance directive?    No - Patient declined     Copy of Healthcare Power of Attorney in Chart? No - copy requested  No - copy requested  No - copy requested    Would patient like information on creating a medical advance directive?  No - Patient declined    No - Patient declined No - Patient declined    Current  Medications (verified) Outpatient Encounter Medications as of 04/28/2023  Medication Sig   acetaminophen (TYLENOL) 650 MG CR tablet Take 650 mg by mouth daily as needed.   aspirin EC 81 MG EC tablet Take 1 tablet (81 mg total) by mouth daily.   cyanocobalamin (VITAMIN B12) 1000 MCG tablet Take 1,000 mcg by mouth daily.   losartan (COZAAR) 50 MG tablet TAKE 1 TABLET(50 MG) BY MOUTH DAILY   melatonin 5 MG TABS Take 5 mg by mouth at bedtime as needed (prn).   rosuvastatin (CRESTOR) 10 MG tablet Take 1 tablet (10 mg total) by mouth daily.   tamsulosin (FLOMAX) 0.4 MG CAPS capsule TAKE 1 CAPSULE(0.4 MG) BY MOUTH DAILY   vitamin E 1000 UNIT capsule Take 1,000 Units by mouth daily with breakfast.   No facility-administered encounter medications on file as of 04/28/2023.    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 OR;  Service: General;  Laterality: Right;   Family History  Problem Relation Age of Onset   Arthritis Mother    Heart disease Father    Arthritis Father    Diabetes Maternal Aunt    Cancer Paternal Uncle         lung   Stroke Maternal Grandmother    Diabetes Maternal Grandmother    Social History   Socioeconomic History   Marital status: Married    Spouse name: Joe Robertson   Number of children: Not on file   Years of education: Not on file   Highest education level: Doctorate  Occupational History    Comment: retired Adult nurse  Tobacco Use   Smoking status: Former   Smokeless tobacco: Never   Tobacco comments:    as teenager  Substance and Sexual Activity   Alcohol use: No   Drug use: No   Sexual activity: Not on file  Other Topics Concern   Not on file  Social History Narrative   Not on file   Social Determinants of Health   Financial Resource Strain: Low Risk  (04/28/2023)   Overall Financial Resource Strain (CARDIA)    Difficulty of Paying Living Expenses: Not hard at all  Food Insecurity: No Food Insecurity (04/28/2023)   Hunger Vital Sign    Worried About Running Out of Food in the Last Year: Never true    Ran Out of Food in the Last Year: Never true  Transportation Needs: No Transportation Needs (04/28/2023)   PRAPARE - Administrator, Civil Service (Medical): No    Lack of Transportation (Non-Medical): No  Physical Activity: Insufficiently Active (04/28/2023)   Exercise Vital Sign    Days of Exercise per Week: 6 days    Minutes of Exercise per Session: 20 min  Stress: No Stress Concern Present (04/28/2023)   [employer] of Occupational Health - Occupational Stress Questionnaire    Feeling of Stress : Not at all  Social Connections: Socially Integrated (04/28/2023)   Social Connection and Isolation Panel [NHANES]    Frequency of Communication with Friends and Family: More than three times a week    Frequency of Social Gatherings with Friends and Family: Once a week    Attends Religious Services: More than 4 times per year    Active Member of [employer] or Organizations: No    Attends Engineer, structural: More than 4 times per year    Marital  Status: Married    Tobacco Counseling Counseling given: Not Answered Tobacco comments: as teenager   Clinical Intake:  Pre-visit preparation completed: Yes  Pain : No/denies pain Pain Score: 0-No pain     BMI - recorded: 29.13 Nutritional Status: BMI 25 -29 Overweight Nutritional Risks: None Diabetes: No  How often do you need to have someone help you when you read instructions, pamphlets, or other written materials from your doctor or pharmacy?: 1 - Never What is the last grade level you completed in school?: HSG; COLLEGE GRADUATE (DOCTORATE)  Interpreter Needed?: No  Information entered by :: Tecia Cinnamon N. Bryelle Spiewak, LPN.   Activities of Daily  Living    04/28/2023    2:09 PM  In your present state of health, do you have any difficulty performing the following activities:  Hearing? 0  Vision? 0  Difficulty concentrating or making decisions? 0  Walking or climbing stairs? 0  Dressing or bathing? 0  Doing errands, shopping? 0  Preparing Food and eating ? N  Using the Toilet? N  In the past six months, have you accidently leaked urine? N  Do you have problems with loss of bowel control? N  Managing your Medications? N  Managing your Finances? N  Housekeeping or managing your Housekeeping? N    Patient Care Team: Pincus Sanes, MD as PCP - General (Internal Medicine) Maisie Fus, MD as PCP - Cardiology (Cardiology) Genia Del Daisy Blossom, MD as Consulting Physician (Ophthalmology) Janalyn Harder, MD (Inactive) as Consulting Physician (Dermatology)  Indicate any recent Medical Services you may have received from other than [facility] providers in the past year (date may be approximate).     Assessment:   This is a routine wellness examination for Joe Robertson.  Hearing/Vision screen Hearing Screening - Comments:: Patient denied any hearing difficulty.   No hearing aids.  Vision Screening - Comments:: Patient does wear corrective lenses/contacts for reading.  Annual eye  exam done by: Mack Hook, MD.    Goals Addressed             This Visit's Progress    My goal is to improve after having the stroke.        Depression Screen    04/28/2023    2:09 PM 02/07/2023    8:01 AM 08/27/2022    9:15 AM 04/24/2022    8:56 AM 03/04/2022    8:57 AM 01/03/2022    2:55 PM 01/02/2022    1:08 PM  PHQ 2/9 Scores  PHQ - 2 Score 0 0 0 0 0 0 0  PHQ- 9 Score 0  0 0 0 0 0    Fall Risk    04/28/2023    2:08 PM 02/07/2023    7:58 AM 08/27/2022   10:06 AM 08/27/2022    9:15 AM 04/24/2022    8:52 AM  Fall Risk   Falls in the past year? 1 1 1  0 1  Number falls in past yr: 0 0 1 0 1  Injury with Fall? 0 1 1 0 1  Comment  broken last 2 fingers on left had, head did hit the ground     Risk for fall due to :  Other (Comment) History of fall(s) No Fall Risks History of fall(s);Impaired balance/gait;Orthopedic patient  Follow up Falls evaluation completed;Education provided Falls evaluation completed Falls prevention discussed Falls evaluation completed Education provided;Falls prevention discussed    MEDICARE RISK AT HOME: Medicare Risk at Home Any stairs in or around the home?: No If so, are there any without handrails?: No Home free of loose throw rugs in walkways, pet beds, electrical cords, etc?: Yes Adequate lighting in your home to reduce risk of falls?: Yes Life alert?: No Use of a cane, walker or w/c?: Yes Grab bars in the bathroom?: Yes Shower chair or bench in shower?: Yes Elevated toilet seat or a handicapped toilet?: Yes  TIMED UP AND GO:  Was the test performed?  No    Cognitive Function:    05/21/2022   11:54 AM  MMSE - Mini Mental State Exam  Orientation to time 2  Orientation to Place 4  Registration 3  Attention/  Calculation 2  Recall 0  Language- name 2 objects 2  Language- repeat 0  Language- follow 3 step command 3  Language- read & follow direction 1  Write a sentence 1  Copy design 1  Copy design-comments 4 animals, failed clock  drawing  Total score 19        04/28/2023    2:09 PM 04/24/2022    9:02 AM  6CIT Screen  What Year? 0 points 0 points  What month? 0 points 0 points  What time? 0 points 0 points  Count back from 20 0 points 0 points  Months in reverse 0 points 0 points  Repeat phrase 0 points 0 points  Total Score 0 points 0 points    Immunizations Immunization History  Administered Date(s) Administered   Fluad Quad(high Dose 65+) 04/12/2019, 06/01/2020, 05/14/2021, 05/20/2022   Influenza Split 06/13/2014   Influenza, High Dose Seasonal PF 05/21/2015, 05/20/2017, 05/18/2018   Influenza-Unspecified 06/02/2016   Moderna Sars-Covid-2 Vaccination 10/15/2019, 11/12/2019, 08/24/2020   Pneumococcal Conjugate-13 05/20/2015   Pneumococcal Polysaccharide-23 07/03/2016   Tdap 05/18/2018, 02/03/2023    TDAP status: Up to date  Flu Vaccine status: Due, Education has been provided regarding the importance of this vaccine. Advised may receive this vaccine at local pharmacy or Health Dept. Aware to provide a copy of the vaccination record if obtained from local pharmacy or Health Dept. Verbalized acceptance and understanding.  Pneumococcal vaccine status: Up to date  Covid-19 vaccine status: Completed vaccines  Qualifies for Shingles Vaccine? Yes   Zostavax completed No   Shingrix Completed?: No.    Education has been provided regarding the importance of this vaccine. Patient has been advised to call insurance company to determine out of pocket expense if they have not yet received this vaccine. Advised may also receive vaccine at local pharmacy or Health Dept. Verbalized acceptance and understanding.  Screening Tests Health Maintenance  Topic Date Due   Zoster Vaccines- Shingrix (1 of 2) Never done   INFLUENZA VACCINE  03/20/2023   COVID-19 Vaccine (4 - 2023-24 season) 04/20/2023   Medicare Annual Wellness (AWV)  04/27/2024   DTaP/Tdap/Td (3 - Td or Tdap) 02/02/2033   Pneumonia Vaccine 39+ Years old   Completed   HPV VACCINES  Aged Out    Health Maintenance  Health Maintenance Due  Topic Date Due   Zoster Vaccines- Shingrix (1 of 2) Never done   INFLUENZA VACCINE  03/20/2023   COVID-19 Vaccine (4 - 2023-24 season) 04/20/2023    Colorectal cancer screening: No longer required.   Lung Cancer Screening: (Low Dose CT Chest recommended if Age 90-80 years, 20 pack-year currently smoking OR have quit w/in 15years.) does not qualify.   Lung Cancer Screening Referral: no  Additional Screening:  Hepatitis C Screening: does not qualify.  Vision Screening: Recommended annual ophthalmology exams for early detection of glaucoma and other disorders of the eye. Is the patient up to date with their annual eye exam?  Yes  Who is the provider or what is the name of the office in which the patient attends annual eye exams? Mack Hook, MD. If pt is not established with a provider, would they like to be referred to a provider to establish care? No .   Dental Screening: Recommended annual dental exams for proper oral hygiene  Diabetic Foot Exam: N/A  Community Resource Referral / Chronic Care Management: CRR required this visit?  No   CCM required this visit?  No     Plan:  I have personally reviewed and noted the following in the patient's chart:   Medical and social history Use of alcohol, tobacco or illicit drugs  Current medications and supplements including opioid prescriptions. Patient is not currently taking opioid prescriptions. Functional ability and status Nutritional status Physical activity Advanced directives List of other physicians Hospitalizations, surgeries, and ER visits in previous 12 months Vitals Screenings to include cognitive, depression, and falls Referrals and appointments  In addition, I have reviewed and discussed with patient certain preventive protocols, quality metrics, and best practice recommendations. A written personalized care plan for  preventive services as well as general preventive health recommendations were provided to patient.     Mickeal Needy, LPN   08/24/1094   After Visit Summary: (Mail) Due to this being a telephonic visit, the after visit summary with patients personalized plan was offered to patient via mail   Nurse Notes: Normal cognitive status assessed by direct observation via telephone conversation by this Nurse Health Advisor. No abnormalities found.

## 2023-05-02 ENCOUNTER — Ambulatory Visit (INDEPENDENT_AMBULATORY_CARE_PROVIDER_SITE_OTHER): Payer: Medicare Other

## 2023-05-02 DIAGNOSIS — Z23 Encounter for immunization: Secondary | ICD-10-CM

## 2023-07-24 ENCOUNTER — Other Ambulatory Visit: Payer: Self-pay | Admitting: Internal Medicine

## 2023-08-03 ENCOUNTER — Encounter: Payer: Self-pay | Admitting: Internal Medicine

## 2023-08-03 NOTE — Progress Notes (Unsigned)
    Subjective:    Patient ID: Joe Robertson, male    DOB: [birth year], 82 y.o.   MRN: [id number]     HPI Joe Robertson is here for a physical exam and his chronic medical problems.      Medications and allergies reviewed with patient and updated if appropriate.  Current Outpatient Medications on File Prior to Visit  Medication Sig Dispense Refill   acetaminophen (TYLENOL) 650 MG CR tablet Take 650 mg by mouth daily as needed.     aspirin EC 81 MG EC tablet Take 1 tablet (81 mg total) by mouth daily.     cyanocobalamin (VITAMIN B12) 1000 MCG tablet Take 1,000 mcg by mouth daily.     losartan (COZAAR) 50 MG tablet TAKE 1 TABLET(50 MG) BY MOUTH DAILY 90 tablet 1   melatonin 5 MG TABS Take 5 mg by mouth at bedtime as needed (prn).     rosuvastatin (CRESTOR) 10 MG tablet Take 1 tablet (10 mg total) by mouth daily. 90 tablet 3   tamsulosin (FLOMAX) 0.4 MG CAPS capsule TAKE 1 CAPSULE(0.4 MG) BY MOUTH DAILY 30 capsule 3   vitamin E 1000 UNIT capsule Take 1,000 Units by mouth daily with breakfast.     No current facility-administered medications on file prior to visit.    Review of Systems     Objective:  There were no vitals filed for this visit. There were no vitals filed for this visit. There is no height or weight on file to calculate BMI.  BP Readings from Last 3 Encounters:  02/14/23 118/64  02/11/23 132/78  02/07/23 128/78    Wt Readings from Last 3 Encounters:  04/28/23 203 lb (92.1 kg)  04/01/23 203 lb (92.1 kg)  03/04/23 205 lb (93 kg)      Physical Exam Constitutional: He appears well-developed and well-nourished. No distress.  HENT:  Head: Normocephalic and atraumatic.  Right Ear: External ear normal.  Left Ear: External ear normal.  Normal ear canals and TM b/l  Mouth/Throat: Oropharynx is clear and moist. Eyes: Conjunctivae and EOM are normal.  Neck: Neck supple. No tracheal deviation present. No thyromegaly present.  No carotid bruit  Cardiovascular:  Normal rate, regular rhythm, normal heart sounds and intact distal pulses.   No murmur heard.  No lower extremity edema. Pulmonary/Chest: Effort normal and breath sounds normal. No respiratory distress. He has no wheezes. He has no rales.  Abdominal: Soft. He exhibits no distension. There is no tenderness.  Genitourinary: deferred  Lymphadenopathy:   He has no cervical adenopathy.  Skin: Skin is warm and dry. He is not diaphoretic.  Psychiatric: He has a normal mood and affect. His behavior is normal.         Assessment & Plan:   Physical exam: Screening blood work  ordered Exercise    Weight   Substance abuse   none   Reviewed recommended immunizations.   Health Maintenance  Topic Date Due   Zoster Vaccines- Shingrix (1 of 2) Never done   COVID-19 Vaccine (4 - 2024-25 season) 04/20/2023   Medicare Annual Wellness (AWV)  04/27/2024   DTaP/Tdap/Td (3 - Td or Tdap) 02/02/2033   Pneumonia Vaccine 56+ Years old  Completed   INFLUENZA VACCINE  Completed   HPV VACCINES  Aged Out     See Problem List for Assessment and Plan of chronic medical problems.

## 2023-08-03 NOTE — Patient Instructions (Addendum)
Blood work was ordered.       Medications changes include :   restart ASA 81 mg, crestor and B12     Return in about 6 months (around 02/05/2024) for follow up.   Health Maintenance, Male Adopting a healthy lifestyle and getting preventive care are important in promoting health and wellness. Ask your health care provider about: The right schedule for you to have regular tests and exams. Things you can do on your own to prevent diseases and keep yourself healthy. What should I know about diet, weight, and exercise? Eat a healthy diet  Eat a diet that includes plenty of vegetables, fruits, low-fat dairy products, and lean protein. Do not eat a lot of foods that are high in solid fats, added sugars, or sodium. Maintain a healthy weight Body mass index (BMI) is a measurement that can be used to identify possible weight problems. It estimates body fat based on height and weight. Your health care provider can help determine your BMI and help you achieve or maintain a healthy weight. Get regular exercise Get regular exercise. This is one of the most important things you can do for your health. Most adults should: Exercise for at least 150 minutes each week. The exercise should increase your heart rate and make you sweat (moderate-intensity exercise). Do strengthening exercises at least twice a week. This is in addition to the moderate-intensity exercise. Spend less time sitting. Even light physical activity can be beneficial. Watch cholesterol and blood lipids Have your blood tested for lipids and cholesterol at 81 years of age, then have this test every 5 years. You may need to have your cholesterol levels checked more often if: Your lipid or cholesterol levels are high. You are older than 81 years of age. You are at high risk for heart disease. What should I know about cancer screening? Many types of cancers can be detected early and may often be prevented. Depending on your  health history and family history, you may need to have cancer screening at various ages. This may include screening for: Colorectal cancer. Prostate cancer. Skin cancer. Lung cancer. What should I know about heart disease, diabetes, and high blood pressure? Blood pressure and heart disease High blood pressure causes heart disease and increases the risk of stroke. This is more likely to develop in people who have high blood pressure readings or are overweight. Talk with your health care provider about your target blood pressure readings. Have your blood pressure checked: Every 3-5 years if you are 61-8 years of age. Every year if you are 34 years old or older. If you are between the ages of 60 and 71 and are a current or former smoker, ask your health care provider if you should have a one-time screening for abdominal aortic aneurysm (AAA). Diabetes Have regular diabetes screenings. This checks your fasting blood sugar level. Have the screening done: Once every three years after age 44 if you are at a normal weight and have a low risk for diabetes. More often and at a younger age if you are overweight or have a high risk for diabetes. What should I know about preventing infection? Hepatitis B If you have a higher risk for hepatitis B, you should be screened for this virus. Talk with your health care provider to find out if you are at risk for hepatitis B infection. Hepatitis C Blood testing is recommended for: Everyone born from [birth year] through 1964. Anyone with known risk  factors for hepatitis C. Sexually transmitted infections (STIs) You should be screened each year for STIs, including gonorrhea and chlamydia, if: You are sexually active and are younger than 81 years of age. You are older than 81 years of age and your health care provider tells you that you are at risk for this type of infection. Your sexual activity has changed since you were last screened, and you are at increased risk  for chlamydia or gonorrhea. Ask your health care provider if you are at risk. Ask your health care provider about whether you are at high risk for HIV. Your health care provider may recommend a prescription medicine to help prevent HIV infection. If you choose to take medicine to prevent HIV, you should first get tested for HIV. You should then be tested every 3 months for as long as you are taking the medicine. Follow these instructions at home: Alcohol use Do not drink alcohol if your health care provider tells you not to drink. If you drink alcohol: Limit how much you have to 0-2 drinks a day. Know how much alcohol is in your drink. In the [place] one drink equals one 12 oz bottle of beer (355 mL), one 5 oz glass of wine (148 mL), or one 1 oz glass of hard liquor (44 mL). Lifestyle Do not use any products that contain nicotine or tobacco. These products include cigarettes, chewing tobacco, and vaping devices, such as e-cigarettes. If you need help quitting, ask your health care provider. Do not use street drugs. Do not share needles. Ask your health care provider for help if you need support or information about quitting drugs. General instructions Schedule regular health, dental, and eye exams. Stay current with your vaccines. Tell your health care provider if: You often feel depressed. You have ever been abused or do not feel safe at home. Summary Adopting a healthy lifestyle and getting preventive care are important in promoting health and wellness. Follow your health care provider's instructions about healthy diet, exercising, and getting tested or screened for diseases. Follow your health care provider's instructions on monitoring your cholesterol and blood pressure. This information is not intended to replace advice given to you by your health care provider. Make sure you discuss any questions you have with your health care provider. Document Revised: 12/25/2020 Document Reviewed:  12/25/2020 Elsevier Patient Education  2024 [employer].

## 2023-08-07 ENCOUNTER — Ambulatory Visit: Payer: Medicare Other | Admitting: Internal Medicine

## 2023-08-07 VITALS — BP 130/82 | HR 93 | Temp 97.6°F | Ht 71.0 in | Wt 199.6 lb

## 2023-08-07 DIAGNOSIS — N400 Enlarged prostate without lower urinary tract symptoms: Secondary | ICD-10-CM

## 2023-08-07 DIAGNOSIS — I10 Essential (primary) hypertension: Secondary | ICD-10-CM | POA: Diagnosis not present

## 2023-08-07 DIAGNOSIS — Z0000 Encounter for general adult medical examination without abnormal findings: Secondary | ICD-10-CM | POA: Diagnosis not present

## 2023-08-07 DIAGNOSIS — R7303 Prediabetes: Secondary | ICD-10-CM

## 2023-08-07 DIAGNOSIS — E538 Deficiency of other specified B group vitamins: Secondary | ICD-10-CM | POA: Diagnosis not present

## 2023-08-07 DIAGNOSIS — E782 Mixed hyperlipidemia: Secondary | ICD-10-CM

## 2023-08-07 DIAGNOSIS — G3184 Mild cognitive impairment, so stated: Secondary | ICD-10-CM

## 2023-08-07 DIAGNOSIS — E559 Vitamin D deficiency, unspecified: Secondary | ICD-10-CM

## 2023-08-07 LAB — CBC WITH DIFFERENTIAL/PLATELET
Basophils Absolute: 0 10*3/uL (ref 0.0–0.1)
Basophils Relative: 0.3 % (ref 0.0–3.0)
Eosinophils Absolute: 0.2 10*3/uL (ref 0.0–0.7)
Eosinophils Relative: 2.6 % (ref 0.0–5.0)
HCT: 49 % (ref 39.0–52.0)
Hemoglobin: 16 g/dL (ref 13.0–17.0)
Lymphocytes Relative: 39.9 % (ref 12.0–46.0)
Lymphs Abs: 3.3 10*3/uL (ref 0.7–4.0)
MCHC: 32.7 g/dL (ref 30.0–36.0)
MCV: 94.7 fL (ref 78.0–100.0)
Monocytes Absolute: 0.9 10*3/uL (ref 0.1–1.0)
Monocytes Relative: 11.1 % (ref 3.0–12.0)
Neutro Abs: 3.8 10*3/uL (ref 1.4–7.7)
Neutrophils Relative %: 46.1 % (ref 43.0–77.0)
Platelets: 225 10*3/uL (ref 150.0–400.0)
RBC: 5.17 Mil/uL (ref 4.22–5.81)
RDW: 14.7 % (ref 11.5–15.5)
WBC: 8.2 10*3/uL (ref 4.0–10.5)

## 2023-08-07 LAB — LIPID PANEL
Cholesterol: 219 mg/dL — ABNORMAL HIGH (ref 0–200)
HDL: 34.9 mg/dL — ABNORMAL LOW (ref >39.00)
LDL Cholesterol: 160 mg/dL — ABNORMAL HIGH (ref 0–99)
NonHDL: 184.59
Total CHOL/HDL Ratio: 6
Triglycerides: 123 mg/dL (ref 0.0–149.0)
VLDL: 24.6 mg/dL (ref 0.0–40.0)

## 2023-08-07 LAB — COMPREHENSIVE METABOLIC PANEL
ALT: 18 U/L (ref 0–53)
AST: 19 U/L (ref 0–37)
Albumin: 4.2 g/dL (ref 3.5–5.2)
Alkaline Phosphatase: 64 U/L (ref 39–117)
BUN: 15 mg/dL (ref 6–23)
CO2: 27 meq/L (ref 19–32)
Calcium: 9.2 mg/dL (ref 8.4–10.5)
Chloride: 104 meq/L (ref 96–112)
Creatinine, Ser: 1.26 mg/dL (ref 0.40–1.50)
GFR: 53.61 mL/min — ABNORMAL LOW (ref >60.00)
Glucose, Bld: 90 mg/dL (ref 70–99)
Potassium: 4.3 meq/L (ref 3.5–5.1)
Sodium: 140 meq/L (ref 135–145)
Total Bilirubin: 1.3 mg/dL — ABNORMAL HIGH (ref 0.2–1.2)
Total Protein: 7.3 g/dL (ref 6.0–8.3)

## 2023-08-07 LAB — HEMOGLOBIN A1C: Hgb A1c MFr Bld: 5.7 % (ref 4.6–6.5)

## 2023-08-07 LAB — TSH: TSH: 2.02 u[IU]/mL (ref 0.35–5.50)

## 2023-08-07 LAB — VITAMIN B12: Vitamin B-12: 358 pg/mL (ref 211–911)

## 2023-08-07 NOTE — Assessment & Plan Note (Addendum)
Chronic Not taking B12 daily Stressed importance of taking B12 daily Check B12 level

## 2023-08-07 NOTE — Assessment & Plan Note (Signed)
Chronic Check a1c Low sugar / carb diet Stressed regular exercise  

## 2023-08-07 NOTE — Assessment & Plan Note (Addendum)
Chronic Memory has worsened - he is dependent on his wife Saw Dr. Pearlean Brownie 05/2022-MMSE 19 Encouraged regular exercise

## 2023-08-07 NOTE — Assessment & Plan Note (Addendum)
Chronic Blood pressure controlled CMP, cbc Has stopped losartan 50 mg daily - will hold for now and monitor BP at home

## 2023-08-07 NOTE — Assessment & Plan Note (Addendum)
Chronic Not taking vitamin D daily

## 2023-08-07 NOTE — Assessment & Plan Note (Addendum)
Chronic Regular exercise and healthy diet encouraged Check lipid panel, CMP restart Crestor 10 mg daily - stressed reason he needs to take it is to prevent further strokes

## 2023-08-07 NOTE — Assessment & Plan Note (Addendum)
Chronic Stopped Flomax 0.4 mg daily 1 month ago Denies any difficulty urinating Will continue to monitor symptoms w/o medication

## 2023-08-11 ENCOUNTER — Other Ambulatory Visit (HOSPITAL_BASED_OUTPATIENT_CLINIC_OR_DEPARTMENT_OTHER): Payer: Self-pay

## 2023-08-11 ENCOUNTER — Emergency Department (HOSPITAL_BASED_OUTPATIENT_CLINIC_OR_DEPARTMENT_OTHER)
Admission: EM | Admit: 2023-08-11 | Discharge: 2023-08-11 | Disposition: A | Discharge status: Home / Self Care | Payer: Medicare Other | Attending: Emergency Medicine | Admitting: Emergency Medicine

## 2023-08-11 ENCOUNTER — Emergency Department (HOSPITAL_BASED_OUTPATIENT_CLINIC_OR_DEPARTMENT_OTHER): Payer: Medicare Other

## 2023-08-11 ENCOUNTER — Encounter (HOSPITAL_BASED_OUTPATIENT_CLINIC_OR_DEPARTMENT_OTHER): Payer: Self-pay | Admitting: Emergency Medicine

## 2023-08-11 ENCOUNTER — Other Ambulatory Visit: Payer: Self-pay

## 2023-08-11 DIAGNOSIS — R9431 Abnormal electrocardiogram [ECG] [EKG]: Secondary | ICD-10-CM | POA: Diagnosis not present

## 2023-08-11 DIAGNOSIS — R531 Weakness: Secondary | ICD-10-CM | POA: Diagnosis not present

## 2023-08-11 DIAGNOSIS — S199XXA Unspecified injury of neck, initial encounter: Secondary | ICD-10-CM | POA: Diagnosis not present

## 2023-08-11 DIAGNOSIS — M25552 Pain in left hip: Secondary | ICD-10-CM | POA: Diagnosis not present

## 2023-08-11 DIAGNOSIS — S3993XA Unspecified injury of pelvis, initial encounter: Secondary | ICD-10-CM | POA: Diagnosis not present

## 2023-08-11 DIAGNOSIS — M47817 Spondylosis without myelopathy or radiculopathy, lumbosacral region: Secondary | ICD-10-CM | POA: Diagnosis not present

## 2023-08-11 DIAGNOSIS — W01198A Fall on same level from slipping, tripping and stumbling with subsequent striking against other object, initial encounter: Secondary | ICD-10-CM | POA: Insufficient documentation

## 2023-08-11 DIAGNOSIS — K5730 Diverticulosis of large intestine without perforation or abscess without bleeding: Secondary | ICD-10-CM | POA: Diagnosis not present

## 2023-08-11 DIAGNOSIS — N400 Enlarged prostate without lower urinary tract symptoms: Secondary | ICD-10-CM | POA: Diagnosis not present

## 2023-08-11 DIAGNOSIS — M25551 Pain in right hip: Secondary | ICD-10-CM | POA: Diagnosis not present

## 2023-08-11 DIAGNOSIS — M4807 Spinal stenosis, lumbosacral region: Secondary | ICD-10-CM | POA: Diagnosis not present

## 2023-08-11 DIAGNOSIS — S0990XA Unspecified injury of head, initial encounter: Secondary | ICD-10-CM | POA: Diagnosis not present

## 2023-08-11 MED ORDER — OXYCODONE HCL 5 MG PO TABS
5.0000 mg | ORAL_TABLET | Freq: Once | ORAL | Status: AC
  Administered 2023-08-11: 5 mg via ORAL
  Filled 2023-08-11: qty 1

## 2023-08-11 MED ORDER — OXYCODONE-ACETAMINOPHEN 5-325 MG PO TABS
1.0000 | ORAL_TABLET | Freq: Four times a day (QID) | ORAL | 0 refills | Status: DC | PRN
  Filled 2023-08-11: qty 8, 2d supply, fill #0

## 2023-08-11 MED ORDER — ACETAMINOPHEN 500 MG PO TABS
1000.0000 mg | ORAL_TABLET | Freq: Once | ORAL | Status: AC
  Administered 2023-08-11: 1000 mg via ORAL
  Filled 2023-08-11: qty 2

## 2023-08-11 NOTE — ED Notes (Signed)
Pt ambulated, per Provider request... Pt had no problem ambulating, No N/V, dizziness or new pain (pain had decreased).

## 2023-08-11 NOTE — ED Notes (Signed)
Discharge paperwork given and verbally understood. 

## 2023-08-11 NOTE — ED Provider Notes (Signed)
[place] EMERGENCY DEPARTMENT AT [facility] Provider Note   CSN: [id number] Arrival date & time: 08/11/23  6644     History  Chief Complaint  Patient presents with   Weakness   HPI Joe Robertson is a 82 y.o. male with history of a stroke presenting for fall and weakness.  Patient states he was hanging a wreath on the back deck about a week ago when he fell backwards landed on his buttock and hit the back of his head.  States then he has had considerable pain around the waistline in both hips at times radiates down both legs.  Denies saddle anesthesia, urinary or bowel changes and fever.  States he normally walks with a cane but because of the pain he is feels like he is limited in his mobility.  Also states that he feels generally weak.  Denies cough, fever, nausea vomiting diarrhea.  Denies chest pain and abdominal pain.  Reports he takes a baby aspirin but denies any other blood thinners.   Weakness      Home Medications Prior to Admission medications   Medication Sig Start Date End Date Taking? Authorizing Provider  oxyCODONE-acetaminophen (PERCOCET/ROXICET) 5-325 MG tablet Take 1 tablet by mouth every 6 (six) hours as needed for severe pain (pain score 7-10). 08/11/23  Yes Gareth Eagle, PA-C  acetaminophen (TYLENOL) 650 MG CR tablet Take 650 mg by mouth daily as needed. Patient not taking: Reported on 08/07/2023    [provider]  aspirin EC 81 MG EC tablet Take 1 tablet (81 mg total) by mouth daily. Patient not taking: Reported on 08/07/2023 11/03/19   Layne Benton, NP  cyanocobalamin (VITAMIN B12) 1000 MCG tablet Take 1,000 mcg by mouth daily. Patient not taking: Reported on 08/07/2023    [provider]  rosuvastatin (CRESTOR) 10 MG tablet Take 1 tablet (10 mg total) by mouth daily. Patient not taking: Reported on 08/07/2023 10/25/22   Pincus Sanes, MD      Allergies    Simvastatin and Lipitor [atorvastatin]    Review of Systems    Review of Systems  Neurological:  Positive for weakness.    Physical Exam Updated Vital Signs BP (!) 149/82   Pulse 84   Temp 97.7 F (36.5 C) (Oral)   Resp 20   Wt 90.3 kg   SpO2 99%   BMI 27.75 kg/m  Physical Exam Vitals and nursing note reviewed.  HENT:     Head: Normocephalic. No raccoon eyes or Battle's sign.     Comments: No rhinorrhea.    Mouth/Throat:     Mouth: Mucous membranes are moist.  Eyes:     General:        Right eye: No discharge.        Left eye: No discharge.     Conjunctiva/sclera: Conjunctivae normal.  Cardiovascular:     Rate and Rhythm: Normal rate and regular rhythm.     Pulses: Normal pulses.     Heart sounds: Normal heart sounds.  Pulmonary:     Effort: Pulmonary effort is normal.     Breath sounds: Normal breath sounds.  Abdominal:     General: Abdomen is flat.     Palpations: Abdomen is soft.  Musculoskeletal:     Right hip: No tenderness. Normal range of motion.     Left hip: No tenderness. Normal range of motion.     Comments: Tenderness with external and internal rotation of both hips.  Patient able to  actively flex and extend the leg at the knee and hip.  Skin:    General: Skin is warm and dry.  Neurological:     General: No focal deficit present.  Psychiatric:        Mood and Affect: Mood normal.     ED Results / Procedures / Treatments   Labs (all labs ordered are listed, but only abnormal results are displayed) Labs Reviewed - No data to display  EKG EKG Interpretation Date/Time:  Monday August 11 2023 09:35:56 EST Ventricular Rate:  80 PR Interval:  163 QRS Duration:  97 QT Interval:  371 QTC Calculation: 428 R Axis:   -64  Text Interpretation: Sinus rhythm Markedly posterior QRS axis No significant change since last tracing Confirmed by Gwyneth Sprout ([id number]) on 08/11/2023 10:17:49 AM  Radiology CT LUMBAR SPINE WO CONTRAST Result Date: 08/11/2023 CLINICAL DATA:  Fall 1 week ago.  Weakness and lethargy.  EXAM: CT LUMBAR SPINE WITHOUT CONTRAST TECHNIQUE: Multidetector CT imaging of the lumbar spine was performed without intravenous contrast administration. Multiplanar CT image reconstructions were also generated. RADIATION DOSE REDUCTION: This exam was performed according to the departmental dose-optimization program which includes automated exposure control, adjustment of the mA and/or kV according to patient size and/or use of iterative reconstruction technique. COMPARISON:  Lumbar spine radiographs 01/22/2022. Abdominopelvic CT 01/02/2022. FINDINGS: Segmentation: There are 5 lumbar type vertebral bodies. Alignment: Straightening of the usual lumbar lordosis. No focal angulation or listhesis. Vertebrae: No evidence of acute fracture, traumatic subluxation or pars defect. No aggressive osseous lesions. Paraspinal and other soft tissues: No acute paraspinal findings. Small hiatal hernia, previous cholecystectomy, nonobstructing right renal calculi and aortoiliac atherosclerosis noted. Disc levels: No acute disc space findings are demonstrated. There is chronic spondylosis at L5-S1 with loss of disc height, annular disc bulging, vacuum phenomenon and endplate osteophytes contributing to mild chronic foraminal narrowing bilaterally. The additional disc heights are maintained. There is mild multilevel facet hypertrophy. IMPRESSION: 1. No evidence of acute lumbar spine fracture, traumatic subluxation or static signs of instability. 2. 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 3. Nonobstructing right renal calculi. 4.  Aortic Atherosclerosis (ICD10-I70.0). Electronically Signed   By: Carey Bullocks M.D.   On: 08/11/2023 11:16   CT PELVIS WO CONTRAST Result Date: 08/11/2023 CLINICAL DATA:  Hip trauma, fracture suspected, no prior imaging. Fall 1 week back. EXAM: CT PELVIS WITHOUT CONTRAST TECHNIQUE: Multidetector CT imaging of the pelvis was performed following the standard  protocol without intravenous contrast. RADIATION DOSE REDUCTION: This exam was performed according to the departmental dose-optimization program which includes automated exposure control, adjustment of the mA and/or kV according to patient size and/or use of iterative reconstruction technique. COMPARISON:  None Available. FINDINGS: Urinary Tract: No abnormality visualized. Unremarkable urinary bladder. Bowel: No disproportionate dilation of the visualized small or large bowel loops. No evidence of abnormal bowel wall thickening or inflammatory changes. The appendix is unremarkable. Multiple diverticula noted in the visualized portion of colon without imaging signs of diverticulitis. Vascular/Lymphatic: No ascites or pneumoperitoneum. No pelvic lymphadenopathy, by size criteria. No aneurysmal dilation of the major arteries. There are moderate peripheral atherosclerotic vascular calcifications of the aorta and its major branches. Reproductive: Enlarged prostate. Symmetric seminal vesicles. Other: Postsurgical changes noted in the right inguinal region from prior hernia repair. There is a small fat containing left inguinal hernia. The soft tissues and abdominal wall are otherwise unremarkable. Musculoskeletal: No acute fracture or dislocation. No aggressive osseous lesion. There are moderate-to-severe  degenerative changes of the right hip joint. Mild degenerative changes of the left hip joint. IMPRESSION: *No acute osseous abnormality of the pelvis. Moderate-to-severe degenerative changes of right hip joint. *Multiple other nonacute observations, as described above. Electronically Signed   By: Jules Schick M.D.   On: 08/11/2023 11:10   CT Head Wo Contrast Result Date: 08/11/2023 CLINICAL DATA:  Polytrauma, blunt EXAM: CT HEAD WITHOUT CONTRAST CT CERVICAL SPINE WITHOUT CONTRAST TECHNIQUE: Multidetector CT imaging of the head and cervical spine was performed following the standard protocol without intravenous  contrast. Multiplanar CT image reconstructions of the cervical spine were also generated. RADIATION DOSE REDUCTION: This exam was performed according to the departmental dose-optimization program which includes automated exposure control, adjustment of the mA and/or kV according to patient size and/or use of iterative reconstruction technique. COMPARISON:  June 17 24. FINDINGS: CT HEAD FINDINGS Brain: No evidence of acute infarction, hemorrhage, hydrocephalus, extra-axial collection or mass lesion/mass effect. Vascular: Calcific atherosclerosis. Skull: No acute fracture. Sinuses/Orbits: Clear sinuses.  No acute findings. CT CERVICAL SPINE FINDINGS Alignment: No substantial sagittal subluxation. Skull base and vertebrae: No acute fracture. Soft tissues and spinal canal: No prevertebral fluid or swelling. No visible canal hematoma. Disc levels: Multilevel degenerative disc disease including disc height loss and facet/uncovertebral hypertrophy. Upper chest: Visualized lung apices are clear. IMPRESSION: No evidence of acute abnormality intracranially or in the cervical spine. Electronically Signed   By: Feliberto Harts M.D.   On: 08/11/2023 11:05   CT Cervical Spine Wo Contrast Result Date: 08/11/2023 CLINICAL DATA:  Polytrauma, blunt EXAM: CT HEAD WITHOUT CONTRAST CT CERVICAL SPINE WITHOUT CONTRAST TECHNIQUE: Multidetector CT imaging of the head and cervical spine was performed following the standard protocol without intravenous contrast. Multiplanar CT image reconstructions of the cervical spine were also generated. RADIATION DOSE REDUCTION: This exam was performed according to the departmental dose-optimization program which includes automated exposure control, adjustment of the mA and/or kV according to patient size and/or use of iterative reconstruction technique. COMPARISON:  June 17 24. FINDINGS: CT HEAD FINDINGS Brain: No evidence of acute infarction, hemorrhage, hydrocephalus, extra-axial collection or  mass lesion/mass effect. Vascular: Calcific atherosclerosis. Skull: No acute fracture. Sinuses/Orbits: Clear sinuses.  No acute findings. CT CERVICAL SPINE FINDINGS Alignment: No substantial sagittal subluxation. Skull base and vertebrae: No acute fracture. Soft tissues and spinal canal: No prevertebral fluid or swelling. No visible canal hematoma. Disc levels: Multilevel degenerative disc disease including disc height loss and facet/uncovertebral hypertrophy. Upper chest: Visualized lung apices are clear. IMPRESSION: No evidence of acute abnormality intracranially or in the cervical spine. Electronically Signed   By: Feliberto Harts M.D.   On: 08/11/2023 11:05    Procedures Procedures    Medications Ordered in ED Medications  acetaminophen (TYLENOL) tablet 1,000 mg (1,000 mg Oral Given 08/11/23 1004)  oxyCODONE (Oxy IR/ROXICODONE) immediate release tablet 5 mg (5 mg Oral Given 08/11/23 1203)    ED Course/ Medical Decision Making/ A&P                                 Medical Decision Making Amount and/or Complexity of Data Reviewed Radiology: ordered.  Risk OTC drugs. Prescription drug management.   Initial Impression and Ddx 82 year old well-appearing male presenting for pain around his hips and generalized weakness after a fall about a week ago.  Exam was unremarkable.  DDx includes hip fracture or other pelvic fracture, sepsis, stroke, arrhythmia, other. Patient PMH that  increases complexity of ED encounter: History of stroke  Interpretation of Diagnostics - I independently visualized the following imaging with scope of interpretation limited to determining acute life threatening conditions related to emergency care: No acute findings on CT scans of the head, cervical spine, pelvis and lumbar spine  -I personally reviewed interpret EKG which revealed sinus rhythm and nonischemic  Patient Reassessment and Ultimate Disposition/Management On reassessment, patient was able to  ambulate around the room and down the hall with his cane.  Considered stroke but unlikely given nonfocal neuroexam and appears that he is describing a "weakness" is more of reluctance to ambulate due to pain in his hips.  CT scans without acute findings.  Did discuss the more chronic findings on CT with patient.  Sent Percocet to his pharmacy for acute pain but also recommended Tylenol and ibuprofen and conservative treatment at home as well.  Advised him to follow-up with his PCP.  Turn precautions.  Vital stable.  Discharged home in good condition.  Patient management required discussion with the following services or consulting groups:  None  Complexity of Problems Addressed Acute complicated illness or Injury  Additional Data Reviewed and Analyzed Further history obtained from: Further history from spouse/family member, Past medical history and medications listed in the EMR, and Prior ED visit notes  Patient Encounter Risk Assessment Prescriptions         Final Clinical Impression(s) / ED Diagnoses Final diagnoses:  Bilateral hip pain    Rx / DC Orders ED Discharge Orders          Ordered    oxyCODONE-acetaminophen (PERCOCET/ROXICET) 5-325 MG tablet  Every 6 hours PRN        08/11/23 1229              Gareth Eagle, PA-C 08/11/23 1231    Gwyneth Sprout, MD 08/11/23 1418

## 2023-08-11 NOTE — Discharge Instructions (Addendum)
Evaluation today was overall reassuring.  Suspect you are having some pain related to your recent fall.  Please follow-up with your PCP.  I did send a few tablets of Percocet for acute pain.  Also recommend ibuprofen as well as Tylenol.  If you develop any chest pain, palpitations, if you have a new fall, or unable to ambulate or bear weight or develop a fever or any other concerning symptom please return emergency department further evaluation.

## 2023-08-11 NOTE — ED Triage Notes (Signed)
Pt c/o fall x 1 week pta. Pt c/o "feeling weak"  since fall. Pt aox4, lethargy noted

## 2023-08-25 ENCOUNTER — Telehealth: Payer: Self-pay | Admitting: *Deleted

## 2023-08-25 NOTE — Progress Notes (Signed)
 Transition Care Management Follow-up Telephone Call Date of discharge and from where: [facility] 08/11/2023 How have you been since you were released from the hospital? Doing very well will see dr this week  Any questions or concerns? No  Items Reviewed: Did the pt receive and understand the discharge instructions provided? Yes  Medications obtained and verified? No  Other? No  Any new allergies since your discharge? No  Dietary orders reviewed? No Do you have support at home? Yes     Follow up appointments reviewed:  PCP Hospital f/u appt confirmed? Yes  Will see this week  . Are transportation arrangements needed? No  If their condition worsens, is the pt aware to call PCP or go to the Emergency Dept.? Yes Was the patient provided with contact information for the PCP's office or ED? Yes Was to pt encouraged to call back with questions or concerns? Yes

## 2023-08-26 ENCOUNTER — Encounter: Payer: Self-pay | Admitting: Internal Medicine

## 2023-08-26 DIAGNOSIS — R296 Repeated falls: Secondary | ICD-10-CM | POA: Insufficient documentation

## 2023-08-26 DIAGNOSIS — M25551 Pain in right hip: Secondary | ICD-10-CM | POA: Insufficient documentation

## 2023-08-26 DIAGNOSIS — R531 Weakness: Secondary | ICD-10-CM | POA: Insufficient documentation

## 2023-08-26 NOTE — Patient Instructions (Addendum)
     Have xrays downstairs   Medications changes include :   tramadol 50-100 mg three times a day as needed - take with tylenol.    Can take up to 3000 mg of tylenol.   Use the walker all the time.

## 2023-08-26 NOTE — Progress Notes (Signed)
 Subjective:    Patient ID: Joe Robertson, male    DOB: 04-09-1941, 83 y.o.   MRN: [id number]     HPI Joe Robertson is here for follow up from the hospital.  He is here with his wife who helps supplement the history.  He is a poor historian and unfortunately history is limited  He presented to the ED with weakness, fall and b/l hip pain.  He fell a week prior while trying to hang a wreath on his back deck.   He landed backwards and landed on his buttock and hit the back of his head.  He had pain around his waistline and in both hips.  At times pain radiates down both legs.  No saddle anesthesia.  No bowel/bladder changes.  No fever.  His mobility was limited due to pain.  Stated generalized weakness.    Imaging showed no concerning findings on CT of head, c spine, pelvis and lumbar spine.  EKG unchanged.  Discharged home with oxycodone  prn.  He took it for one week - it did not help.  He is taking tylenol .  Lidocaine  patches did not help.  Meloxciam did not help.    Most of his pain is in his lower back.  Pain is moderate-severe with sitting.  Laying down is more comfortable - pain is still moderate.  Pain in leg - some but not terrible.  No n/t.  Using walker.    Pain has not improved in the last 2 weeks.  Wife is concerned because he is mostly laying in bed.  It is hard for him to differentiate pain in the lower back versus the right hip.   Medications and allergies reviewed with patient and updated if appropriate.  Current Outpatient Medications on File Prior to Visit  Medication Sig Dispense Refill   acetaminophen  (TYLENOL ) 650 MG CR tablet Take 650 mg by mouth daily as needed.     aspirin  EC 81 MG EC tablet Take 1 tablet (81 mg total) by mouth daily.     cyanocobalamin  (VITAMIN B12) 1000 MCG tablet Take 1,000 mcg by mouth daily.     oxyCODONE -acetaminophen  (PERCOCET/ROXICET) 5-325 MG tablet Take 1 tablet by mouth every 6 (six) hours as needed for severe pain (pain score 7-10). 8  tablet 0   rosuvastatin  (CRESTOR ) 10 MG tablet Take 1 tablet (10 mg total) by mouth daily. 90 tablet 3   No current facility-administered medications on file prior to visit.     Review of Systems  Musculoskeletal:  Positive for back pain.  Neurological:  Negative for dizziness, light-headedness, numbness and headaches.       Objective:   Vitals:   08/27/23 0852  BP: (!) 150/98  Pulse: 90  Temp: (!) 97.5 F (36.4 C)  SpO2: 97%   BP Readings from Last 3 Encounters:  08/27/23 (!) 150/98  08/11/23 (!) 154/99  08/07/23 130/82   Wt Readings from Last 3 Encounters:  08/27/23 195 lb (88.5 kg)  08/11/23 199 lb (90.3 kg)  08/07/23 199 lb 9.6 oz (90.5 kg)   Body mass index is 27.2 kg/m.    Physical Exam Constitutional:      General: He is not in acute distress.    Appearance: He is not ill-appearing.     Comments: Alert, answers questions appropriately, but at times seems out of it which seems to be related to the pain.  He is in pain even at rest which increases with movement.  Musculoskeletal:  General: Tenderness (Tenderness lower lumbar spine and sacrum.  No bilateral SI joint pain with palpation, no pain in right lower back or left lower back) present.     Right lower leg: No edema.     Left lower leg: No edema.  Skin:    General: Skin is warm and dry.     Findings: Bruising (Bruising, erythema from previous falls arm, leg) present.  Neurological:     General: No focal deficit present.     Mental Status: He is alert.     Sensory: No sensory deficit.     Motor: Weakness (Bilateral leg weakness which is difficult to assess because it does cause pain which seems to be the cause of the weakness) present.     Gait: Gait abnormal.        Lab Results  Component Value Date   WBC 8.2 08/07/2023   HGB 16.0 08/07/2023   HCT 49.0 08/07/2023   PLT 225.0 08/07/2023   GLUCOSE 90 08/07/2023   CHOL 219 (H) 08/07/2023   TRIG 123.0 08/07/2023   HDL 34.90 (L)  08/07/2023   LDLDIRECT 147.0 02/02/2018   LDLCALC 160 (H) 08/07/2023   ALT 18 08/07/2023   AST 19 08/07/2023   NA 140 08/07/2023   K 4.3 08/07/2023   CL 104 08/07/2023   CREATININE 1.26 08/07/2023   BUN 15 08/07/2023   CO2 27 08/07/2023   TSH 2.02 08/07/2023   INR 1.0 10/30/2019   HGBA1C 5.7 08/07/2023   CT LUMBAR SPINE WO CONTRAST CLINICAL DATA:  Fall 1 week ago.  Weakness and lethargy.  EXAM: CT LUMBAR SPINE WITHOUT CONTRAST  TECHNIQUE: Multidetector CT imaging of the lumbar spine was performed without intravenous contrast administration. Multiplanar CT image reconstructions were also generated.  RADIATION DOSE REDUCTION: This exam was performed according to the departmental dose-optimization program which includes automated exposure control, adjustment of the mA and/or kV according to patient size and/or use of iterative reconstruction technique.  COMPARISON:  Lumbar spine radiographs 01/22/2022. Abdominopelvic CT 01/02/2022.  FINDINGS: Segmentation: There are 5 lumbar type vertebral bodies.  Alignment: Straightening of the usual lumbar lordosis. No focal angulation or listhesis.  Vertebrae: No evidence of acute fracture, traumatic subluxation or pars defect. No aggressive osseous lesions.  Paraspinal and other soft tissues: No acute paraspinal findings. Small hiatal hernia, previous cholecystectomy, nonobstructing right renal calculi and aortoiliac atherosclerosis noted.  Disc levels: No acute disc space findings are demonstrated. There is chronic spondylosis at L5-S1 with loss of disc height, annular disc bulging, vacuum phenomenon and endplate osteophytes contributing to mild chronic foraminal narrowing bilaterally. The additional disc heights are maintained. There is mild multilevel facet hypertrophy.  IMPRESSION: 1. No evidence of acute lumbar spine fracture, traumatic subluxation or static signs of instability. 2. 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 3. Nonobstructing right renal calculi. 4.  Aortic Atherosclerosis (ICD10-I70.0).  Electronically Signed   By: Elsie Perone M.D.   On: 08/11/2023 11:16 CT PELVIS WO CONTRAST CLINICAL DATA:  Hip trauma, fracture suspected, no prior imaging. Fall 1 week back.  EXAM: CT PELVIS WITHOUT CONTRAST  TECHNIQUE: Multidetector CT imaging of the pelvis was performed following the standard protocol without intravenous contrast.  RADIATION DOSE REDUCTION: This exam was performed according to the departmental dose-optimization program which includes automated exposure control, adjustment of the mA and/or kV according to patient size and/or use of iterative reconstruction technique.  COMPARISON:  None Available.  FINDINGS: Urinary Tract: No abnormality visualized.  Unremarkable urinary bladder.  Bowel: No disproportionate dilation of the visualized small or large bowel loops. No evidence of abnormal bowel wall thickening or inflammatory changes. The appendix is unremarkable. Multiple diverticula noted in the visualized portion of colon without imaging signs of diverticulitis.  Vascular/Lymphatic: No ascites or pneumoperitoneum. No pelvic lymphadenopathy, by size criteria. No aneurysmal dilation of the major arteries. There are moderate peripheral atherosclerotic vascular calcifications of the aorta and its major branches.  Reproductive: Enlarged prostate. Symmetric seminal vesicles.  Other: Postsurgical changes noted in the right inguinal region from prior hernia repair. There is a small fat containing left inguinal hernia. The soft tissues and abdominal wall are otherwise unremarkable.  Musculoskeletal: No acute fracture or dislocation. No aggressive osseous lesion. There are moderate-to-severe degenerative changes of the right hip joint. Mild degenerative changes of the left hip joint.  IMPRESSION: *No acute osseous abnormality of the  pelvis. Moderate-to-severe degenerative changes of right hip joint. *Multiple other nonacute observations, as described above.  Electronically Signed   By: Ree Molt M.D.   On: 08/11/2023 11:10 CT Cervical Spine Wo Contrast CLINICAL DATA:  Polytrauma, blunt  EXAM: CT HEAD WITHOUT CONTRAST  CT CERVICAL SPINE WITHOUT CONTRAST  TECHNIQUE: Multidetector CT imaging of the head and cervical spine was performed following the standard protocol without intravenous contrast. Multiplanar CT image reconstructions of the cervical spine were also generated.  RADIATION DOSE REDUCTION: This exam was performed according to the departmental dose-optimization program which includes automated exposure control, adjustment of the mA and/or kV according to patient size and/or use of iterative reconstruction technique.  COMPARISON:  June 17 24.  FINDINGS: CT HEAD FINDINGS  Brain: No evidence of acute infarction, hemorrhage, hydrocephalus, extra-axial collection or mass lesion/mass effect.  Vascular: Calcific atherosclerosis.  Skull: No acute fracture.  Sinuses/Orbits: Clear sinuses.  No acute findings.  CT CERVICAL SPINE FINDINGS  Alignment: No substantial sagittal subluxation.  Skull base and vertebrae: No acute fracture.  Soft tissues and spinal canal: No prevertebral fluid or swelling. No visible canal hematoma.  Disc levels: Multilevel degenerative disc disease including disc height loss and facet/uncovertebral hypertrophy.  Upper chest: Visualized lung apices are clear.  IMPRESSION: No evidence of acute abnormality intracranially or in the cervical spine.  Electronically Signed   By: Gilmore GORMAN Molt M.D.   On: 08/11/2023 11:05 CT Head Wo Contrast CLINICAL DATA:  Polytrauma, blunt  EXAM: CT HEAD WITHOUT CONTRAST  CT CERVICAL SPINE WITHOUT CONTRAST  TECHNIQUE: Multidetector CT imaging of the head and cervical spine was performed following the standard protocol  without intravenous contrast. Multiplanar CT image reconstructions of the cervical spine were also generated.  RADIATION DOSE REDUCTION: This exam was performed according to the departmental dose-optimization program which includes automated exposure control, adjustment of the mA and/or kV according to patient size and/or use of iterative reconstruction technique.  COMPARISON:  June 17 24.  FINDINGS: CT HEAD FINDINGS  Brain: No evidence of acute infarction, hemorrhage, hydrocephalus, extra-axial collection or mass lesion/mass effect.  Vascular: Calcific atherosclerosis.  Skull: No acute fracture.  Sinuses/Orbits: Clear sinuses.  No acute findings.  CT CERVICAL SPINE FINDINGS  Alignment: No substantial sagittal subluxation.  Skull base and vertebrae: No acute fracture.  Soft tissues and spinal canal: No prevertebral fluid or swelling. No visible canal hematoma.  Disc levels: Multilevel degenerative disc disease including disc height loss and facet/uncovertebral hypertrophy.  Upper chest: Visualized lung apices are clear.  IMPRESSION: No evidence of acute abnormality intracranially or in the cervical spine.  Electronically Signed   By: Gilmore GORMAN Molt M.D.   On: 08/11/2023 11:05    Assessment & Plan:    See Problem List for Assessment and Plan of chronic medical problems.

## 2023-08-27 ENCOUNTER — Ambulatory Visit: Payer: Medicare Other | Admitting: Sports Medicine

## 2023-08-27 ENCOUNTER — Ambulatory Visit (INDEPENDENT_AMBULATORY_CARE_PROVIDER_SITE_OTHER): Payer: Medicare Other | Admitting: Internal Medicine

## 2023-08-27 ENCOUNTER — Ambulatory Visit (INDEPENDENT_AMBULATORY_CARE_PROVIDER_SITE_OTHER): Payer: Medicare Other

## 2023-08-27 VITALS — BP 150/98 | HR 90 | Temp 97.5°F | Ht 71.0 in | Wt 195.0 lb

## 2023-08-27 VITALS — BP 150/98 | HR 90 | Ht 71.0 in | Wt 195.0 lb

## 2023-08-27 DIAGNOSIS — G8929 Other chronic pain: Secondary | ICD-10-CM

## 2023-08-27 DIAGNOSIS — M8580 Other specified disorders of bone density and structure, unspecified site: Secondary | ICD-10-CM | POA: Diagnosis not present

## 2023-08-27 DIAGNOSIS — R296 Repeated falls: Secondary | ICD-10-CM

## 2023-08-27 DIAGNOSIS — M533 Sacrococcygeal disorders, not elsewhere classified: Secondary | ICD-10-CM | POA: Diagnosis not present

## 2023-08-27 DIAGNOSIS — M1611 Unilateral primary osteoarthritis, right hip: Secondary | ICD-10-CM | POA: Diagnosis not present

## 2023-08-27 DIAGNOSIS — M545 Low back pain, unspecified: Secondary | ICD-10-CM | POA: Diagnosis not present

## 2023-08-27 DIAGNOSIS — M5441 Lumbago with sciatica, right side: Secondary | ICD-10-CM | POA: Diagnosis not present

## 2023-08-27 DIAGNOSIS — M25551 Pain in right hip: Secondary | ICD-10-CM | POA: Diagnosis not present

## 2023-08-27 DIAGNOSIS — M5442 Lumbago with sciatica, left side: Secondary | ICD-10-CM

## 2023-08-27 DIAGNOSIS — R531 Weakness: Secondary | ICD-10-CM

## 2023-08-27 DIAGNOSIS — I700 Atherosclerosis of aorta: Secondary | ICD-10-CM | POA: Diagnosis not present

## 2023-08-27 DIAGNOSIS — F0390 Unspecified dementia without behavioral disturbance: Secondary | ICD-10-CM

## 2023-08-27 DIAGNOSIS — S3210XA Unspecified fracture of sacrum, initial encounter for closed fracture: Secondary | ICD-10-CM | POA: Diagnosis not present

## 2023-08-27 DIAGNOSIS — R29898 Other symptoms and signs involving the musculoskeletal system: Secondary | ICD-10-CM

## 2023-08-27 DIAGNOSIS — M25552 Pain in left hip: Secondary | ICD-10-CM

## 2023-08-27 MED ORDER — TRAMADOL HCL 50 MG PO TABS: 50.0000 mg | ORAL_TABLET | Freq: Three times a day (TID) | ORAL | 0 refills | Status: DC | PRN

## 2023-08-27 NOTE — Assessment & Plan Note (Addendum)
 Presented to the ED recently with weakness after fall and bilateral hip pain Has significant physical deconditioning which is contributing Encouraged home PT-he is in too much pain now to participate, but stressed to his wife that once his pain improves he needs to do some physical therapy

## 2023-08-27 NOTE — Assessment & Plan Note (Signed)
 Acute Related to recent fall at home CT lumbar spine negative for fracture Pain has not improved in the last 2 weeks and he is having pain with palpation of the lumbar spine and sacral area Will get x-rays just to make sure fracture was not missed Some of his pain seems to be related to his severe right hip arthritis, but he is not able to differentiate the 2 Oxycodone  did not help his pain Start tramadol  50-100 mg 3 times daily-advised to take 1000 mg of Tylenol  with the tramadol  3 times a day Advised regular pain medication use since his pain is not controlled Discussed possible side effects Encouraged him to get up and walk around with his walker and not lay in bed all day Given the severity of the pain we will have him see sports medicine-x-ray lumbar spine, sacrum today

## 2023-08-27 NOTE — Progress Notes (Signed)
 [name] [address] [place] [address] Phone: [phone]   Assessment and Plan:     1. Chronic bilateral low back pain with bilateral sciatica 2. Frequent falls 3. Weakness of both lower extremities -Chronic with exacerbation, subsequent visit - Patient has had 5-6 falls at home according to his wife.  I am concerned these falls may have caused a fracture to lumbar vertebrae.  Severity of pain can be so intense that patient is unable to walk at times.  Recommend further evaluation with lumbar MRI.  Reassuring that no fracture was seen from CT lumbar spine, pelvis at ER visit on 08/11/2023, however patient has had additional falls since this time. - Reviewed x-rays at today's visit.  No  dislocation, or vertebral collapse seen.  Acute to subacute S3 sacral fracture minimally displaced.  Chronic degenerative changes seen in lumbar spine and bilateral hips, right worse than left - Start Tylenol  500 to 1000 mg tablets 2-3 times a day for day-to-day pain relief - May use tramadol  50 to 100 mg every 8 hours as needed for severe breakthrough pain - Recommend relative rest and using walker at all times when ambulatory  4. Dementia, unspecified dementia severity, unspecified dementia type, unspecified whether behavioral, psychotic, or mood disturbance or anxiety (HCC)  -Chronic with exacerbation, initial visit - I spoke one-on-one with patient's wife at today's office visit.  I discussed my concerns with increased frequency of falls, worsening of long-term and short-term memory that I have noticed progressing between office visits.  Based on this progression, I am concerned that patient will have a fall potentially in the near future that could be fatal or cause serious long-term harm or fracture.  Personally I would recommend a skilled nursing facility or other similar home that can help take care of patient 24/7.  I would recommend at  least for patient to have home health nursing for assistance.  Patient's wife stated that she would talk to her family and discuss options.  Encouraged them to reach out to either myself and/or PCP Dr. Geofm if we can help in any way  Pertinent previous records reviewed include ER note 08/11/2023, internal medicine note 08/27/2023,  Follow Up: Schedule telephone follow-up for 5 days after MRI to discuss results and further treatment plan   Subjective:   I, Joe Robertson, am serving as a neurosurgeon for Doctor Joe Robertson  Chief Complaint: low back pain   HPI:   08/27/23 Patient is a 83 year old male with low back pain. Patient states was seen in ED 08/11/2023 He presented to the ED with weakness, fall and b/l hip pain.  He fell a week prior while trying to hang a wreath on his back deck.   He landed backwards and landed on his buttock and hit the back of his head.  He had pain around his waistline and in both hips.  At times pain radiates down both legs.  No saddle anesthesia.  No bowel/bladder changes.  No fever.  His mobility was limited due to pain.  Stated generalized weakness.  Imaging showed no concerning findings on CT of head, c spine, pelvis and lumbar spine.  EKG unchanged.  Discharged home with oxycodone  prn.  He took it for one week - it did not help.  He is taking tylenol .  Lidocaine  patches did not help.  Meloxciam did not help.     Most of his pain is in his lower back.  Pain is moderate-severe with sitting.  Laying down is more comfortable - pain is still moderate.  Pain in leg - some but not terrible.  No n/t.  Relevant Historical Information: Hypertension, dementia, multiple falls  Additional pertinent review of systems negative.   Current Outpatient Medications:    acetaminophen  (TYLENOL ) 650 MG CR tablet, Take 650 mg by mouth daily as needed., Disp: , Rfl:    aspirin  EC 81 MG EC tablet, Take 1 tablet (81 mg total) by mouth daily., Disp:  , Rfl:    cyanocobalamin  (VITAMIN  B12) 1000 MCG tablet, Take 1,000 mcg by mouth daily., Disp: , Rfl:    rosuvastatin  (CRESTOR ) 10 MG tablet, Take 1 tablet (10 mg total) by mouth daily., Disp: 90 tablet, Rfl: 3   traMADol  (ULTRAM ) 50 MG tablet, Take 1-2 tablets (50-100 mg total) by mouth every 8 (eight) hours as needed., Disp: 30 tablet, Rfl: 0   Objective:     Vitals:   08/27/23 0948  BP: (!) 150/98  Pulse: 90  SpO2: 97%  Weight: 195 lb (88.5 kg)  Height: 5' 11 (1.803 m)      Body mass index is 27.2 kg/m.    Physical Exam:    General:   cooperative, sitting comfortably in mild acute distress related to pelvic and back pain.  HEENT: Normocephalic, atraumatic.   Neck: No gross abnormality.  Cardiovascular: No pallor or cyanosis. Resp: Comfortable WOB.   Abdomen: Non distended.   Skin: Warm and dry; no focal rashes identified on limited exam. Extremities: No cyanosis or edema.  Neuro: Gross motor and sensory intact. Gait slow and unsteady.  Using rollator for support and assistance Psychiatric: Mood and affect are pleasant, however patient is forgetful, repeating stories, with intermittent somnolence.  He easily awakens when directly asked a question   Electronically signed by:  [name] 11:46 AM 08/27/23

## 2023-08-27 NOTE — Patient Instructions (Addendum)
 Lumbar MRI referral  Tylenol 1000 mg 3x a day  Tramadol for severe breakthrough pain  Call us for 5 days after to schedule a phone visit

## 2023-08-27 NOTE — Assessment & Plan Note (Addendum)
 Acute on chronic bilateral hip pain Acute pain related to fall at home Imaging reassuring-no acute injury in l-spine, hip fx Spondylosis of lumbar spine, moderate-severe OA right hip Prescribed Percocet for pain-this did not help I think most of his pain is related to his severe right hip osteoarthritis Using a walker and discussed that he needs to use that continuously Generalized weakness-discussed he would benefit from home PT once he is able to participate Tramadol  50-100 mg 3 times daily with 1000 mg Tylenol  3 times daily-discussed possible side effects

## 2023-08-27 NOTE — Assessment & Plan Note (Addendum)
 Chronic Has had several falls over the past year or so Has deferred physical therapy Using a cane to ambulate-now using a walker and discussed that he needs to use the walker continuously-no longer uses the cane Discussed fall precaution Encouraged home physical therapy-his wife will let me know when he is able to participate in home PT and I will order it Discussed neuro evaluation to better assess his balance and fall risk

## 2023-09-05 ENCOUNTER — Encounter: Payer: Self-pay | Admitting: Internal Medicine

## 2023-09-08 MED ORDER — TRAMADOL HCL 50 MG PO TABS: 50.0000 mg | ORAL_TABLET | Freq: Three times a day (TID) | ORAL | 0 refills | Status: DC | PRN

## 2023-09-13 ENCOUNTER — Other Ambulatory Visit: Payer: Medicare Other

## 2023-09-22 ENCOUNTER — Ambulatory Visit (INDEPENDENT_AMBULATORY_CARE_PROVIDER_SITE_OTHER): Payer: Medicare Other

## 2023-09-22 ENCOUNTER — Ambulatory Visit (INDEPENDENT_AMBULATORY_CARE_PROVIDER_SITE_OTHER): Payer: Medicare Other | Admitting: Sports Medicine

## 2023-09-22 VITALS — HR 86 | Ht 71.0 in | Wt 195.0 lb

## 2023-09-22 DIAGNOSIS — I700 Atherosclerosis of aorta: Secondary | ICD-10-CM | POA: Diagnosis not present

## 2023-09-22 DIAGNOSIS — M25512 Pain in left shoulder: Secondary | ICD-10-CM

## 2023-09-22 DIAGNOSIS — R296 Repeated falls: Secondary | ICD-10-CM

## 2023-09-22 NOTE — Progress Notes (Signed)
    Joe [facility] D.[name] [address] [place] [address] Phone: [phone]   Assessment and Plan:     1. Acute pain of left shoulder 2. Multiple falls  -Chronic with exacerbation, subsequent visit - Additional fall with most recent occurring on 09/19/2023 with patient trying to clean his car - X-ray obtained in clinic.  My interpretation: No acute fracture or dislocation - Discussed with patient and his daughter the patient is high risk for a significant fracture if he continues to have multiple falls.  Recommend using walker at all times - Continue Tylenol 500 to 1000 mg as needed for day-to-day pain relief  Patient accompanied by his daughter throughout entirety of office visit who will provide HPI  Pertinent previous records reviewed include none  Follow Up: As needed   Subjective:   I, [name], am serving as a Neurosurgeon for Doctor Richardean Sale  Chief Complaint: left shoulder pain   HPI:   09/22/23 Patient is a 83 year old male with left shoulder pain. Patient states he fell Friday cleaning the windows on the car and he fell . He has been wearing his sling feels likes dislocated. No numbness or tingling.   Relevant Historical Information: Hypertension, dementia, multiple falls  Additional pertinent review of systems negative.   Current Outpatient Medications:    acetaminophen (TYLENOL) 650 MG CR tablet, Take 650 mg by mouth daily as needed., Disp: , Rfl:    aspirin EC 81 MG EC tablet, Take 1 tablet (81 mg total) by mouth daily., Disp:  , Rfl:    cyanocobalamin (VITAMIN B12) 1000 MCG tablet, Take 1,000 mcg by mouth daily., Disp: , Rfl:    rosuvastatin (CRESTOR) 10 MG tablet, Take 1 tablet (10 mg total) by mouth daily., Disp: 90 tablet, Rfl: 3   traMADol (ULTRAM) 50 MG tablet, Take 1-2 tablets (50-100 mg total) by mouth every 8 (eight) hours as needed for severe pain (pain score 7-10)., Disp: 40 tablet, Rfl: 0    Objective:     Vitals:   09/22/23 1600  Pulse: 86  SpO2: 99%  Weight: 195 lb (88.5 kg)  Height: 5\' 11"  (1.803 m)      Body mass index is 27.2 kg/m.    Physical Exam:    Gen: Appears well, nad, nontoxic and pleasant Neuro:sensation intact, strength is 5/5 with df/pf/inv/ev, muscle tone wnl Skin: no suspicious lesion or defmority Psych: A&O, appropriate mood and affect  Left shoulder:  No deformity, swelling or muscle wasting No scapular winging FF 160, abd 140, int 20, ext 80 NTTP over the [place], clavicle, ac, coracoid, biceps groove, humerus, deltoid, trapezius, cervical spine Shoulder pain with resisted internal rotation FROM of neck    Electronically signed by:  [facility] D.[name] 4:14 PM 09/22/23

## 2023-09-23 ENCOUNTER — Encounter: Payer: Self-pay | Admitting: Sports Medicine

## 2023-10-06 ENCOUNTER — Encounter: Payer: Self-pay | Admitting: Sports Medicine

## 2023-10-09 ENCOUNTER — Ambulatory Visit: Payer: Medicare Other | Admitting: Dermatology

## 2023-10-17 NOTE — Progress Notes (Signed)
 [name] [address] [place] [address] Phone: [phone]   Assessment and Plan:     1. Chronic bilateral low back pain without sciatica 2. Degeneration of intervertebral disc of lumbar region with discogenic back pain 3. Closed fracture of sacrum with routine healing, unspecified portion of sacrum, subsequent encounter  -Chronic with exacerbation, subsequent visit - Patient continues to have significant low back pain, worse with prolonged walking or standing.  Patient has degenerative changes as seen on repeat x-ray today as well as sacral fracture at S3 as seen on prior imaging from January 2025 with apparent routine healing based on x-ray today.  I believe patient's primary pain generator is from lumbar spine based on HPI and physical exam - Due to debilitating lower back pain with pain frequently >6/10, pain affecting day-to-day activities, history of multiple falls, no improvement with conservative therapy, recommend further evaluation with lumbar MRI.  Order placed today -Continue Tylenol 500 to 1000 mg tablets 2-3 times a day for day-to-day pain relief - Continue using topical Voltaren gel, heating pads for pain relief - Patient could not tolerate lidocaine patches due to skin irritation - Do not recommend centrally acting medications, opioid medications, gabapentin due to potential altered mental status which could contribute to additional falls with patient with history of multiple falls. - Reviewed x-ray in clinic.  My interpretation: No acute fracture or dislocation.  Evidence of routine healing of prior sacral fracture.  Concern for mild left-sided L5 compression. - Recommend using a rollator walker for support at all times to prevent additional falls  Pertinent previous records reviewed include lumbar x-ray January 2025  Patient accompanied by his wife throughout entirety of office visit  Follow Up: 5 days after MRI  to review results and discuss treatment plan   Subjective:   I, Joe Robertson, am serving as a Neurosurgeon for Joe Robertson  Chief Complaint: low back pain  HPI:   10/20/2023 Patient is a 83 year old male with low back pain. Patient states that he is hurting in his lower back for about a month. Pain is worse when standing. Tylenol 2-3 times 1000 mg. Is walking with a walker. Numbness is not consistent enough for his to answer   Relevant Historical Information: Hypertension, dementia, multiple falls    Additional pertinent review of systems negative.   Current Outpatient Medications:    acetaminophen (TYLENOL) 650 MG CR tablet, Take 650 mg by mouth daily as needed., Disp: , Rfl:    aspirin EC 81 MG EC tablet, Take 1 tablet (81 mg total) by mouth daily., Disp:  , Rfl:    cyanocobalamin (VITAMIN B12) 1000 MCG tablet, Take 1,000 mcg by mouth daily., Disp: , Rfl:    rosuvastatin (CRESTOR) 10 MG tablet, Take 1 tablet (10 mg total) by mouth daily., Disp: 90 tablet, Rfl: 3   traMADol (ULTRAM) 50 MG tablet, Take 1-2 tablets (50-100 mg total) by mouth every 8 (eight) hours as needed for severe pain (pain score 7-10)., Disp: 40 tablet, Rfl: 0   Objective:     Vitals:   10/20/23 1010  Pulse: 86  SpO2: 99%  Weight: 195 lb (88.5 kg)  Height: 5\' 11"  (1.803 m)      Body mass index is 27.2 kg/m.    Physical Exam:    General:   cooperative, sitting comfortably in mild acute distress related to pelvic and back pain.  HEENT: Normocephalic, atraumatic.   Neck: No  gross abnormality.  Cardiovascular: No pallor or cyanosis. Resp: Comfortable WOB.   Abdomen: Non distended.   Skin: Warm and dry; no focal rashes identified on limited exam. Extremities: No cyanosis or edema.  Neuro: Gross motor and sensory intact. Gait slow and unsteady.  Using rollator for support and assistance Psychiatric: Mood and affect are pleasant, however patient is forgetful, repeating stories, with intermittent  somnolence.  He easily awakens when directly asked a question    Electronically signed by:  [name] 11:15 AM 10/20/23

## 2023-10-20 ENCOUNTER — Ambulatory Visit: Payer: Medicare Other | Admitting: Sports Medicine

## 2023-10-20 ENCOUNTER — Ambulatory Visit (INDEPENDENT_AMBULATORY_CARE_PROVIDER_SITE_OTHER)

## 2023-10-20 VITALS — HR 86 | Ht 71.0 in | Wt 195.0 lb

## 2023-10-20 DIAGNOSIS — G8929 Other chronic pain: Secondary | ICD-10-CM

## 2023-10-20 DIAGNOSIS — M47816 Spondylosis without myelopathy or radiculopathy, lumbar region: Secondary | ICD-10-CM | POA: Diagnosis not present

## 2023-10-20 DIAGNOSIS — M545 Low back pain, unspecified: Secondary | ICD-10-CM | POA: Diagnosis not present

## 2023-10-20 DIAGNOSIS — S3210XD Unspecified fracture of sacrum, subsequent encounter for fracture with routine healing: Secondary | ICD-10-CM | POA: Diagnosis not present

## 2023-10-20 DIAGNOSIS — M5136 Other intervertebral disc degeneration, lumbar region with discogenic back pain only: Secondary | ICD-10-CM

## 2023-10-20 DIAGNOSIS — R29890 Loss of height: Secondary | ICD-10-CM | POA: Diagnosis not present

## 2023-10-20 DIAGNOSIS — S32059A Unspecified fracture of fifth lumbar vertebra, initial encounter for closed fracture: Secondary | ICD-10-CM | POA: Diagnosis not present

## 2023-10-20 NOTE — Patient Instructions (Signed)
 Mri lumbar  Tylenol [phone] mg 2-3 times a day for pain relief  Continue voltaren gel , icy hot, and heating pad  Use walker at all times  Follow up 5 days after to discuss results

## 2023-10-22 ENCOUNTER — Other Ambulatory Visit: Payer: Self-pay | Admitting: Internal Medicine

## 2023-10-28 ENCOUNTER — Other Ambulatory Visit: Payer: Self-pay | Admitting: Internal Medicine

## 2023-10-28 MED ORDER — TRAMADOL HCL 50 MG PO TABS: 50.0000 mg | ORAL_TABLET | Freq: Three times a day (TID) | ORAL | 0 refills | Status: DC | PRN

## 2023-10-29 ENCOUNTER — Ambulatory Visit
Admission: RE | Admit: 2023-10-29 | Discharge: 2023-10-29 | Disposition: A | Discharge status: Home / Self Care | Source: Ambulatory Visit | Attending: Sports Medicine

## 2023-10-29 DIAGNOSIS — M5126 Other intervertebral disc displacement, lumbar region: Secondary | ICD-10-CM | POA: Diagnosis not present

## 2023-10-29 DIAGNOSIS — M48061 Spinal stenosis, lumbar region without neurogenic claudication: Secondary | ICD-10-CM | POA: Diagnosis not present

## 2023-10-29 DIAGNOSIS — M4856XA Collapsed vertebra, not elsewhere classified, lumbar region, initial encounter for fracture: Secondary | ICD-10-CM | POA: Diagnosis not present

## 2023-10-29 DIAGNOSIS — G8929 Other chronic pain: Secondary | ICD-10-CM

## 2023-10-29 DIAGNOSIS — M47816 Spondylosis without myelopathy or radiculopathy, lumbar region: Secondary | ICD-10-CM | POA: Diagnosis not present

## 2023-11-02 ENCOUNTER — Other Ambulatory Visit: Payer: Self-pay | Admitting: Internal Medicine

## 2023-11-28 NOTE — Progress Notes (Signed)
 [facility] [address] [place] [address] Phone: [phone]   Assessment and Plan:     1. Chronic left shoulder pain -Chronic with exacerbation, subsequent visit - Recurrence of left shoulder pain after new fall 3 weeks ago.  Suspect acute versus subacute rotator cuff tear, likely supraspinatus, based on restricted active abduction, high riding humeral head on x-ray that is new compared with x-ray 2 months ago. - X-ray obtained in clinic.  My interpretation: No acute fracture or dislocation.  High riding humeral head that was not present on comparison shoulder view from 2 months prior - Our treatment goals consist of pain management and maintaining as much strength and range of motion as possible - Continue Tylenol 500 to 1000 mg tablets 2-3 times a day for day-to-day pain relief  -Recommend home physical therapy due to patient's limitations and frequent falls when leaving home - Patient elected for subacromial CSI.  Tolerated well per note below  Procedure: Subacromial Injection Side: Left  Risks explained and consent was given verbally. The site was cleaned with alcohol prep. A steroid injection was performed from posterior approach using 2mL of 1% lidocaine without epinephrine and 1mL of kenalog 40mg /ml. This was well tolerated and resulted in symptomatic relief.  Needle was removed, hemostasis achieved, and post injection instructions were explained.   Pt was advised to call or return to clinic if these symptoms worsen or fail to improve as anticipated.   2. Chronic bilateral low back pain without sciatica 3. Compression fracture of L5 vertebra with routine healing, subsequent encounter 4. Closed fracture of sacrum with delayed healing, unspecified portion of sacrum, subsequent encounter -Chronic, stable, subsequent visit - Patient's MRI shows acute to subacute endplate fracture of L5 with 25% height loss and bilateral sacral  fractures without complete healing - Continue Tylenol 500 to 1000 mg tablets 2-3 times a day for day-to-day pain relief  -Continue topical Voltaren gel and heating pads over areas of pain - Patient could not tolerate lidocaine patches due to skin irritation - Do not recommend medications that could affect mentation including opioid medications, gabapentin due to frequent falls, dementia - Recommend using rollator walker for support at all times to prevent additional falls.  Ideally, patient would benefit from SNF, though patient and family are not interested at this time.  Patient accompanied by his wife throughout entirety of office visit.  Pertinent previous records reviewed include lumbar MRI 11/05/2023  Follow Up: As needed   Subjective:   I, [name], am serving as a Neurosurgeon for Doctor Ulysees Gander   Chief Complaint: left shoulder pain    HPI:    09/22/23 Patient is a 83 year old male with left shoulder pain. Patient states he fell Friday cleaning the windows on the car and he fell . He has been wearing his sling feels likes dislocated. No numbness or tingling.   12/01/2023 Patient states shoulder is in pain  fall about 3 weeks agp   Relevant Historical Information: Hypertension, dementia, multiple falls  Additional pertinent review of systems negative.   Current Outpatient Medications:    acetaminophen (TYLENOL) 650 MG CR tablet, Take 650 mg by mouth daily as needed., Disp: , Rfl:    aspirin EC 81 MG EC tablet, Take 1 tablet (81 mg total) by mouth daily., Disp:  , Rfl:    cyanocobalamin (VITAMIN B12) 1000 MCG tablet, Take 1,000 mcg by mouth daily., Disp: , Rfl:    rosuvastatin (  CRESTOR) 10 MG tablet, TAKE 1 TABLET(10 MG) BY MOUTH DAILY, Disp: 90 tablet, Rfl: 3   traMADol (ULTRAM) 50 MG tablet, Take 1-2 tablets (50-100 mg total) by mouth every 8 (eight) hours as needed for severe pain (pain score 7-10)., Disp: 40 tablet, Rfl: 0   Objective:     Vitals:   12/01/23  0941  Weight: 195 lb (88.5 kg)  Height: 5\' 11"  (1.803 m)      Body mass index is 27.2 kg/m.    Physical Exam:    Gen: Appears well, nad, nontoxic and pleasant Neuro:sensation intact, strength is 5/5 with df/pf/inv/ev, muscle tone wnl Skin: no suspicious lesion or defmority Psych: A&O, appropriate mood and affect   Left shoulder:  No deformity, swelling or muscle wasting No scapular winging FF 100, abd 40, int 30, ext 80 NTTP over the , clavicle, ac, coracoid, biceps groove, humerus, deltoid, trapezius, cervical spine Shoulder pain with resisted internal rotation FROM of neck     Electronically signed by:  Marshall Skeeter D[facility] 10:06 AM 12/01/23

## 2023-12-01 ENCOUNTER — Ambulatory Visit: Admitting: Sports Medicine

## 2023-12-01 ENCOUNTER — Ambulatory Visit (INDEPENDENT_AMBULATORY_CARE_PROVIDER_SITE_OTHER)

## 2023-12-01 VITALS — Ht 71.0 in | Wt 195.0 lb

## 2023-12-01 DIAGNOSIS — S3210XG Unspecified fracture of sacrum, subsequent encounter for fracture with delayed healing: Secondary | ICD-10-CM

## 2023-12-01 DIAGNOSIS — M545 Low back pain, unspecified: Secondary | ICD-10-CM | POA: Diagnosis not present

## 2023-12-01 DIAGNOSIS — S32050D Wedge compression fracture of fifth lumbar vertebra, subsequent encounter for fracture with routine healing: Secondary | ICD-10-CM | POA: Diagnosis not present

## 2023-12-01 DIAGNOSIS — M25512 Pain in left shoulder: Secondary | ICD-10-CM

## 2023-12-01 DIAGNOSIS — I700 Atherosclerosis of aorta: Secondary | ICD-10-CM | POA: Diagnosis not present

## 2023-12-01 DIAGNOSIS — R296 Repeated falls: Secondary | ICD-10-CM

## 2023-12-01 DIAGNOSIS — G8929 Other chronic pain: Secondary | ICD-10-CM

## 2023-12-01 DIAGNOSIS — M19012 Primary osteoarthritis, left shoulder: Secondary | ICD-10-CM | POA: Diagnosis not present

## 2023-12-01 NOTE — Patient Instructions (Signed)
 As needed follow up.

## 2023-12-03 ENCOUNTER — Telehealth: Payer: Self-pay | Admitting: Sports Medicine

## 2023-12-03 NOTE — Addendum Note (Signed)
 Addended by: Audie Bleacher R on: 12/03/2023 08:49 AM   Modules accepted: Orders

## 2023-12-03 NOTE — Telephone Encounter (Signed)
 Message sent to Genna Khan at [facility]

## 2023-12-03 NOTE — Telephone Encounter (Signed)
 Patient's wife Benigno Brakeman) called and stated that they would like to use [facility] for Physical Therapy at home. She says she would like him to work with Raynelle Callow and [place] with [facility]. The other people they were referred to called and wanted him to start today and she says he is not able to start today.

## 2023-12-08 ENCOUNTER — Encounter: Payer: Self-pay | Admitting: Sports Medicine

## 2023-12-12 DIAGNOSIS — M4856XD Collapsed vertebra, not elsewhere classified, lumbar region, subsequent encounter for fracture with routine healing: Secondary | ICD-10-CM | POA: Diagnosis not present

## 2023-12-12 DIAGNOSIS — Z8781 Personal history of (healed) traumatic fracture: Secondary | ICD-10-CM | POA: Diagnosis not present

## 2023-12-12 DIAGNOSIS — Z974 Presence of external hearing-aid: Secondary | ICD-10-CM | POA: Diagnosis not present

## 2023-12-12 DIAGNOSIS — Z87442 Personal history of urinary calculi: Secondary | ICD-10-CM | POA: Diagnosis not present

## 2023-12-12 DIAGNOSIS — K838 Other specified diseases of biliary tract: Secondary | ICD-10-CM | POA: Diagnosis not present

## 2023-12-12 DIAGNOSIS — Z85828 Personal history of other malignant neoplasm of skin: Secondary | ICD-10-CM | POA: Diagnosis not present

## 2023-12-12 DIAGNOSIS — N400 Enlarged prostate without lower urinary tract symptoms: Secondary | ICD-10-CM | POA: Diagnosis not present

## 2023-12-12 DIAGNOSIS — Z9181 History of falling: Secondary | ICD-10-CM | POA: Diagnosis not present

## 2023-12-12 DIAGNOSIS — R7303 Prediabetes: Secondary | ICD-10-CM | POA: Diagnosis not present

## 2023-12-12 DIAGNOSIS — E559 Vitamin D deficiency, unspecified: Secondary | ICD-10-CM | POA: Diagnosis not present

## 2023-12-12 DIAGNOSIS — G479 Sleep disorder, unspecified: Secondary | ICD-10-CM | POA: Diagnosis not present

## 2023-12-12 DIAGNOSIS — E538 Deficiency of other specified B group vitamins: Secondary | ICD-10-CM | POA: Diagnosis not present

## 2023-12-12 DIAGNOSIS — E785 Hyperlipidemia, unspecified: Secondary | ICD-10-CM | POA: Diagnosis not present

## 2023-12-12 DIAGNOSIS — Z7982 Long term (current) use of aspirin: Secondary | ICD-10-CM | POA: Diagnosis not present

## 2023-12-12 DIAGNOSIS — F0280 Dementia in other diseases classified elsewhere without behavioral disturbance: Secondary | ICD-10-CM | POA: Diagnosis not present

## 2023-12-12 DIAGNOSIS — M25512 Pain in left shoulder: Secondary | ICD-10-CM | POA: Diagnosis not present

## 2023-12-12 DIAGNOSIS — Z8673 Personal history of transient ischemic attack (TIA), and cerebral infarction without residual deficits: Secondary | ICD-10-CM | POA: Diagnosis not present

## 2023-12-12 DIAGNOSIS — M6748 Ganglion, other site: Secondary | ICD-10-CM | POA: Diagnosis not present

## 2023-12-12 DIAGNOSIS — G8929 Other chronic pain: Secondary | ICD-10-CM | POA: Diagnosis not present

## 2023-12-12 DIAGNOSIS — M1611 Unilateral primary osteoarthritis, right hip: Secondary | ICD-10-CM | POA: Diagnosis not present

## 2023-12-12 DIAGNOSIS — M8448XG Pathological fracture, other site, subsequent encounter for fracture with delayed healing: Secondary | ICD-10-CM | POA: Diagnosis not present

## 2023-12-12 DIAGNOSIS — I10 Essential (primary) hypertension: Secondary | ICD-10-CM | POA: Diagnosis not present

## 2023-12-12 DIAGNOSIS — R296 Repeated falls: Secondary | ICD-10-CM | POA: Diagnosis not present

## 2023-12-16 DIAGNOSIS — Z8781 Personal history of (healed) traumatic fracture: Secondary | ICD-10-CM | POA: Diagnosis not present

## 2023-12-16 DIAGNOSIS — M1611 Unilateral primary osteoarthritis, right hip: Secondary | ICD-10-CM | POA: Diagnosis not present

## 2023-12-16 DIAGNOSIS — G8929 Other chronic pain: Secondary | ICD-10-CM | POA: Diagnosis not present

## 2023-12-16 DIAGNOSIS — N400 Enlarged prostate without lower urinary tract symptoms: Secondary | ICD-10-CM | POA: Diagnosis not present

## 2023-12-16 DIAGNOSIS — G479 Sleep disorder, unspecified: Secondary | ICD-10-CM | POA: Diagnosis not present

## 2023-12-16 DIAGNOSIS — Z7982 Long term (current) use of aspirin: Secondary | ICD-10-CM | POA: Diagnosis not present

## 2023-12-16 DIAGNOSIS — K838 Other specified diseases of biliary tract: Secondary | ICD-10-CM | POA: Diagnosis not present

## 2023-12-16 DIAGNOSIS — E785 Hyperlipidemia, unspecified: Secondary | ICD-10-CM | POA: Diagnosis not present

## 2023-12-16 DIAGNOSIS — R296 Repeated falls: Secondary | ICD-10-CM | POA: Diagnosis not present

## 2023-12-16 DIAGNOSIS — M4856XD Collapsed vertebra, not elsewhere classified, lumbar region, subsequent encounter for fracture with routine healing: Secondary | ICD-10-CM | POA: Diagnosis not present

## 2023-12-16 DIAGNOSIS — M6748 Ganglion, other site: Secondary | ICD-10-CM | POA: Diagnosis not present

## 2023-12-16 DIAGNOSIS — M25512 Pain in left shoulder: Secondary | ICD-10-CM | POA: Diagnosis not present

## 2023-12-16 DIAGNOSIS — I10 Essential (primary) hypertension: Secondary | ICD-10-CM | POA: Diagnosis not present

## 2023-12-16 DIAGNOSIS — R7303 Prediabetes: Secondary | ICD-10-CM | POA: Diagnosis not present

## 2023-12-16 DIAGNOSIS — E538 Deficiency of other specified B group vitamins: Secondary | ICD-10-CM | POA: Diagnosis not present

## 2023-12-16 DIAGNOSIS — F0280 Dementia in other diseases classified elsewhere without behavioral disturbance: Secondary | ICD-10-CM | POA: Diagnosis not present

## 2023-12-18 DIAGNOSIS — Z8673 Personal history of transient ischemic attack (TIA), and cerebral infarction without residual deficits: Secondary | ICD-10-CM | POA: Diagnosis not present

## 2023-12-18 DIAGNOSIS — G8929 Other chronic pain: Secondary | ICD-10-CM | POA: Diagnosis not present

## 2023-12-18 DIAGNOSIS — E538 Deficiency of other specified B group vitamins: Secondary | ICD-10-CM | POA: Diagnosis not present

## 2023-12-18 DIAGNOSIS — Z85828 Personal history of other malignant neoplasm of skin: Secondary | ICD-10-CM | POA: Diagnosis not present

## 2023-12-18 DIAGNOSIS — M8448XG Pathological fracture, other site, subsequent encounter for fracture with delayed healing: Secondary | ICD-10-CM | POA: Diagnosis not present

## 2023-12-18 DIAGNOSIS — I10 Essential (primary) hypertension: Secondary | ICD-10-CM | POA: Diagnosis not present

## 2023-12-18 DIAGNOSIS — Z87442 Personal history of urinary calculi: Secondary | ICD-10-CM | POA: Diagnosis not present

## 2023-12-18 DIAGNOSIS — M6748 Ganglion, other site: Secondary | ICD-10-CM | POA: Diagnosis not present

## 2023-12-18 DIAGNOSIS — K838 Other specified diseases of biliary tract: Secondary | ICD-10-CM | POA: Diagnosis not present

## 2023-12-18 DIAGNOSIS — Z974 Presence of external hearing-aid: Secondary | ICD-10-CM | POA: Diagnosis not present

## 2023-12-18 DIAGNOSIS — E559 Vitamin D deficiency, unspecified: Secondary | ICD-10-CM | POA: Diagnosis not present

## 2023-12-18 DIAGNOSIS — N400 Enlarged prostate without lower urinary tract symptoms: Secondary | ICD-10-CM | POA: Diagnosis not present

## 2023-12-18 DIAGNOSIS — M1611 Unilateral primary osteoarthritis, right hip: Secondary | ICD-10-CM | POA: Diagnosis not present

## 2023-12-18 DIAGNOSIS — M4856XD Collapsed vertebra, not elsewhere classified, lumbar region, subsequent encounter for fracture with routine healing: Secondary | ICD-10-CM | POA: Diagnosis not present

## 2023-12-18 DIAGNOSIS — M25512 Pain in left shoulder: Secondary | ICD-10-CM | POA: Diagnosis not present

## 2023-12-18 DIAGNOSIS — R7303 Prediabetes: Secondary | ICD-10-CM | POA: Diagnosis not present

## 2023-12-18 DIAGNOSIS — G479 Sleep disorder, unspecified: Secondary | ICD-10-CM | POA: Diagnosis not present

## 2023-12-18 DIAGNOSIS — E785 Hyperlipidemia, unspecified: Secondary | ICD-10-CM | POA: Diagnosis not present

## 2023-12-18 DIAGNOSIS — F0280 Dementia in other diseases classified elsewhere without behavioral disturbance: Secondary | ICD-10-CM | POA: Diagnosis not present

## 2023-12-18 DIAGNOSIS — R296 Repeated falls: Secondary | ICD-10-CM | POA: Diagnosis not present

## 2023-12-18 DIAGNOSIS — Z7982 Long term (current) use of aspirin: Secondary | ICD-10-CM | POA: Diagnosis not present

## 2023-12-18 DIAGNOSIS — Z9181 History of falling: Secondary | ICD-10-CM | POA: Diagnosis not present

## 2023-12-18 DIAGNOSIS — Z8781 Personal history of (healed) traumatic fracture: Secondary | ICD-10-CM | POA: Diagnosis not present

## 2023-12-19 ENCOUNTER — Other Ambulatory Visit (HOSPITAL_BASED_OUTPATIENT_CLINIC_OR_DEPARTMENT_OTHER): Payer: Self-pay

## 2023-12-23 DIAGNOSIS — H01009 Unspecified blepharitis unspecified eye, unspecified eyelid: Secondary | ICD-10-CM | POA: Diagnosis not present

## 2023-12-24 DIAGNOSIS — Z87442 Personal history of urinary calculi: Secondary | ICD-10-CM | POA: Diagnosis not present

## 2023-12-24 DIAGNOSIS — F0280 Dementia in other diseases classified elsewhere without behavioral disturbance: Secondary | ICD-10-CM | POA: Diagnosis not present

## 2023-12-24 DIAGNOSIS — Z974 Presence of external hearing-aid: Secondary | ICD-10-CM | POA: Diagnosis not present

## 2023-12-24 DIAGNOSIS — Z7982 Long term (current) use of aspirin: Secondary | ICD-10-CM | POA: Diagnosis not present

## 2023-12-24 DIAGNOSIS — E559 Vitamin D deficiency, unspecified: Secondary | ICD-10-CM | POA: Diagnosis not present

## 2023-12-24 DIAGNOSIS — E538 Deficiency of other specified B group vitamins: Secondary | ICD-10-CM | POA: Diagnosis not present

## 2023-12-24 DIAGNOSIS — E785 Hyperlipidemia, unspecified: Secondary | ICD-10-CM | POA: Diagnosis not present

## 2023-12-24 DIAGNOSIS — N400 Enlarged prostate without lower urinary tract symptoms: Secondary | ICD-10-CM | POA: Diagnosis not present

## 2023-12-24 DIAGNOSIS — M1611 Unilateral primary osteoarthritis, right hip: Secondary | ICD-10-CM | POA: Diagnosis not present

## 2023-12-24 DIAGNOSIS — M8448XG Pathological fracture, other site, subsequent encounter for fracture with delayed healing: Secondary | ICD-10-CM | POA: Diagnosis not present

## 2023-12-24 DIAGNOSIS — Z9181 History of falling: Secondary | ICD-10-CM | POA: Diagnosis not present

## 2023-12-24 DIAGNOSIS — Z8781 Personal history of (healed) traumatic fracture: Secondary | ICD-10-CM | POA: Diagnosis not present

## 2023-12-24 DIAGNOSIS — K838 Other specified diseases of biliary tract: Secondary | ICD-10-CM | POA: Diagnosis not present

## 2023-12-24 DIAGNOSIS — R7303 Prediabetes: Secondary | ICD-10-CM | POA: Diagnosis not present

## 2023-12-24 DIAGNOSIS — Z8673 Personal history of transient ischemic attack (TIA), and cerebral infarction without residual deficits: Secondary | ICD-10-CM | POA: Diagnosis not present

## 2023-12-24 DIAGNOSIS — M4856XD Collapsed vertebra, not elsewhere classified, lumbar region, subsequent encounter for fracture with routine healing: Secondary | ICD-10-CM | POA: Diagnosis not present

## 2023-12-24 DIAGNOSIS — G479 Sleep disorder, unspecified: Secondary | ICD-10-CM | POA: Diagnosis not present

## 2023-12-24 DIAGNOSIS — M25512 Pain in left shoulder: Secondary | ICD-10-CM | POA: Diagnosis not present

## 2023-12-24 DIAGNOSIS — M6748 Ganglion, other site: Secondary | ICD-10-CM | POA: Diagnosis not present

## 2023-12-24 DIAGNOSIS — I10 Essential (primary) hypertension: Secondary | ICD-10-CM | POA: Diagnosis not present

## 2023-12-24 DIAGNOSIS — G8929 Other chronic pain: Secondary | ICD-10-CM | POA: Diagnosis not present

## 2023-12-24 DIAGNOSIS — Z85828 Personal history of other malignant neoplasm of skin: Secondary | ICD-10-CM | POA: Diagnosis not present

## 2023-12-24 DIAGNOSIS — R296 Repeated falls: Secondary | ICD-10-CM | POA: Diagnosis not present

## 2023-12-26 DIAGNOSIS — M1611 Unilateral primary osteoarthritis, right hip: Secondary | ICD-10-CM | POA: Diagnosis not present

## 2023-12-26 DIAGNOSIS — E538 Deficiency of other specified B group vitamins: Secondary | ICD-10-CM | POA: Diagnosis not present

## 2023-12-26 DIAGNOSIS — Z7982 Long term (current) use of aspirin: Secondary | ICD-10-CM | POA: Diagnosis not present

## 2023-12-26 DIAGNOSIS — Z8781 Personal history of (healed) traumatic fracture: Secondary | ICD-10-CM | POA: Diagnosis not present

## 2023-12-26 DIAGNOSIS — R296 Repeated falls: Secondary | ICD-10-CM | POA: Diagnosis not present

## 2023-12-26 DIAGNOSIS — Z8673 Personal history of transient ischemic attack (TIA), and cerebral infarction without residual deficits: Secondary | ICD-10-CM | POA: Diagnosis not present

## 2023-12-26 DIAGNOSIS — E559 Vitamin D deficiency, unspecified: Secondary | ICD-10-CM | POA: Diagnosis not present

## 2023-12-26 DIAGNOSIS — Z974 Presence of external hearing-aid: Secondary | ICD-10-CM | POA: Diagnosis not present

## 2023-12-26 DIAGNOSIS — N400 Enlarged prostate without lower urinary tract symptoms: Secondary | ICD-10-CM | POA: Diagnosis not present

## 2023-12-26 DIAGNOSIS — G479 Sleep disorder, unspecified: Secondary | ICD-10-CM | POA: Diagnosis not present

## 2023-12-26 DIAGNOSIS — K838 Other specified diseases of biliary tract: Secondary | ICD-10-CM | POA: Diagnosis not present

## 2023-12-26 DIAGNOSIS — G8929 Other chronic pain: Secondary | ICD-10-CM | POA: Diagnosis not present

## 2023-12-26 DIAGNOSIS — M25512 Pain in left shoulder: Secondary | ICD-10-CM | POA: Diagnosis not present

## 2023-12-26 DIAGNOSIS — Z85828 Personal history of other malignant neoplasm of skin: Secondary | ICD-10-CM | POA: Diagnosis not present

## 2023-12-26 DIAGNOSIS — F0280 Dementia in other diseases classified elsewhere without behavioral disturbance: Secondary | ICD-10-CM | POA: Diagnosis not present

## 2023-12-26 DIAGNOSIS — I10 Essential (primary) hypertension: Secondary | ICD-10-CM | POA: Diagnosis not present

## 2023-12-26 DIAGNOSIS — E785 Hyperlipidemia, unspecified: Secondary | ICD-10-CM | POA: Diagnosis not present

## 2023-12-26 DIAGNOSIS — M8448XG Pathological fracture, other site, subsequent encounter for fracture with delayed healing: Secondary | ICD-10-CM | POA: Diagnosis not present

## 2023-12-26 DIAGNOSIS — Z87442 Personal history of urinary calculi: Secondary | ICD-10-CM | POA: Diagnosis not present

## 2023-12-26 DIAGNOSIS — M4856XD Collapsed vertebra, not elsewhere classified, lumbar region, subsequent encounter for fracture with routine healing: Secondary | ICD-10-CM | POA: Diagnosis not present

## 2023-12-26 DIAGNOSIS — Z9181 History of falling: Secondary | ICD-10-CM | POA: Diagnosis not present

## 2023-12-26 DIAGNOSIS — R7303 Prediabetes: Secondary | ICD-10-CM | POA: Diagnosis not present

## 2023-12-26 DIAGNOSIS — M6748 Ganglion, other site: Secondary | ICD-10-CM | POA: Diagnosis not present

## 2023-12-30 DIAGNOSIS — M25512 Pain in left shoulder: Secondary | ICD-10-CM | POA: Diagnosis not present

## 2023-12-30 DIAGNOSIS — G8929 Other chronic pain: Secondary | ICD-10-CM | POA: Diagnosis not present

## 2023-12-30 DIAGNOSIS — Z7982 Long term (current) use of aspirin: Secondary | ICD-10-CM | POA: Diagnosis not present

## 2023-12-30 DIAGNOSIS — K838 Other specified diseases of biliary tract: Secondary | ICD-10-CM | POA: Diagnosis not present

## 2023-12-30 DIAGNOSIS — E785 Hyperlipidemia, unspecified: Secondary | ICD-10-CM | POA: Diagnosis not present

## 2023-12-30 DIAGNOSIS — Z9181 History of falling: Secondary | ICD-10-CM | POA: Diagnosis not present

## 2023-12-30 DIAGNOSIS — M6748 Ganglion, other site: Secondary | ICD-10-CM | POA: Diagnosis not present

## 2023-12-30 DIAGNOSIS — E538 Deficiency of other specified B group vitamins: Secondary | ICD-10-CM | POA: Diagnosis not present

## 2023-12-30 DIAGNOSIS — F0280 Dementia in other diseases classified elsewhere without behavioral disturbance: Secondary | ICD-10-CM | POA: Diagnosis not present

## 2023-12-30 DIAGNOSIS — Z85828 Personal history of other malignant neoplasm of skin: Secondary | ICD-10-CM | POA: Diagnosis not present

## 2023-12-30 DIAGNOSIS — R7303 Prediabetes: Secondary | ICD-10-CM | POA: Diagnosis not present

## 2023-12-30 DIAGNOSIS — N400 Enlarged prostate without lower urinary tract symptoms: Secondary | ICD-10-CM | POA: Diagnosis not present

## 2023-12-30 DIAGNOSIS — Z974 Presence of external hearing-aid: Secondary | ICD-10-CM | POA: Diagnosis not present

## 2023-12-30 DIAGNOSIS — Z8673 Personal history of transient ischemic attack (TIA), and cerebral infarction without residual deficits: Secondary | ICD-10-CM | POA: Diagnosis not present

## 2023-12-30 DIAGNOSIS — Z8781 Personal history of (healed) traumatic fracture: Secondary | ICD-10-CM | POA: Diagnosis not present

## 2023-12-30 DIAGNOSIS — M8448XG Pathological fracture, other site, subsequent encounter for fracture with delayed healing: Secondary | ICD-10-CM | POA: Diagnosis not present

## 2023-12-30 DIAGNOSIS — I10 Essential (primary) hypertension: Secondary | ICD-10-CM | POA: Diagnosis not present

## 2023-12-30 DIAGNOSIS — G479 Sleep disorder, unspecified: Secondary | ICD-10-CM | POA: Diagnosis not present

## 2023-12-30 DIAGNOSIS — M1611 Unilateral primary osteoarthritis, right hip: Secondary | ICD-10-CM | POA: Diagnosis not present

## 2023-12-30 DIAGNOSIS — R296 Repeated falls: Secondary | ICD-10-CM | POA: Diagnosis not present

## 2023-12-30 DIAGNOSIS — Z87442 Personal history of urinary calculi: Secondary | ICD-10-CM | POA: Diagnosis not present

## 2023-12-30 DIAGNOSIS — M4856XD Collapsed vertebra, not elsewhere classified, lumbar region, subsequent encounter for fracture with routine healing: Secondary | ICD-10-CM | POA: Diagnosis not present

## 2023-12-30 DIAGNOSIS — E559 Vitamin D deficiency, unspecified: Secondary | ICD-10-CM | POA: Diagnosis not present

## 2024-01-06 DIAGNOSIS — H01009 Unspecified blepharitis unspecified eye, unspecified eyelid: Secondary | ICD-10-CM | POA: Diagnosis not present

## 2024-01-07 ENCOUNTER — Emergency Department (HOSPITAL_COMMUNITY)

## 2024-01-07 ENCOUNTER — Emergency Department (HOSPITAL_COMMUNITY)
Admission: EM | Admit: 2024-01-07 | Discharge: 2024-01-07 | Disposition: A | Discharge status: Home / Self Care | Attending: Emergency Medicine | Admitting: Emergency Medicine

## 2024-01-07 ENCOUNTER — Other Ambulatory Visit: Payer: Self-pay

## 2024-01-07 ENCOUNTER — Encounter (HOSPITAL_COMMUNITY): Payer: Self-pay | Admitting: Emergency Medicine

## 2024-01-07 DIAGNOSIS — S12000A Unspecified displaced fracture of first cervical vertebra, initial encounter for closed fracture: Secondary | ICD-10-CM | POA: Diagnosis not present

## 2024-01-07 DIAGNOSIS — E538 Deficiency of other specified B group vitamins: Secondary | ICD-10-CM | POA: Diagnosis not present

## 2024-01-07 DIAGNOSIS — G479 Sleep disorder, unspecified: Secondary | ICD-10-CM | POA: Diagnosis not present

## 2024-01-07 DIAGNOSIS — W01198A Fall on same level from slipping, tripping and stumbling with subsequent striking against other object, initial encounter: Secondary | ICD-10-CM | POA: Diagnosis not present

## 2024-01-07 DIAGNOSIS — M4856XD Collapsed vertebra, not elsewhere classified, lumbar region, subsequent encounter for fracture with routine healing: Secondary | ICD-10-CM | POA: Diagnosis not present

## 2024-01-07 DIAGNOSIS — R7303 Prediabetes: Secondary | ICD-10-CM | POA: Diagnosis not present

## 2024-01-07 DIAGNOSIS — M6748 Ganglion, other site: Secondary | ICD-10-CM | POA: Diagnosis not present

## 2024-01-07 DIAGNOSIS — Z8673 Personal history of transient ischemic attack (TIA), and cerebral infarction without residual deficits: Secondary | ICD-10-CM | POA: Diagnosis not present

## 2024-01-07 DIAGNOSIS — E559 Vitamin D deficiency, unspecified: Secondary | ICD-10-CM | POA: Diagnosis not present

## 2024-01-07 DIAGNOSIS — N400 Enlarged prostate without lower urinary tract symptoms: Secondary | ICD-10-CM | POA: Diagnosis not present

## 2024-01-07 DIAGNOSIS — S12030A Displaced posterior arch fracture of first cervical vertebra, initial encounter for closed fracture: Secondary | ICD-10-CM | POA: Insufficient documentation

## 2024-01-07 DIAGNOSIS — S0990XA Unspecified injury of head, initial encounter: Secondary | ICD-10-CM | POA: Diagnosis not present

## 2024-01-07 DIAGNOSIS — F0280 Dementia in other diseases classified elsewhere without behavioral disturbance: Secondary | ICD-10-CM | POA: Diagnosis not present

## 2024-01-07 DIAGNOSIS — W19XXXA Unspecified fall, initial encounter: Secondary | ICD-10-CM

## 2024-01-07 DIAGNOSIS — M25512 Pain in left shoulder: Secondary | ICD-10-CM | POA: Diagnosis not present

## 2024-01-07 DIAGNOSIS — Z7982 Long term (current) use of aspirin: Secondary | ICD-10-CM | POA: Diagnosis not present

## 2024-01-07 DIAGNOSIS — M8448XG Pathological fracture, other site, subsequent encounter for fracture with delayed healing: Secondary | ICD-10-CM | POA: Diagnosis not present

## 2024-01-07 DIAGNOSIS — S0101XA Laceration without foreign body of scalp, initial encounter: Secondary | ICD-10-CM | POA: Diagnosis not present

## 2024-01-07 DIAGNOSIS — Z9181 History of falling: Secondary | ICD-10-CM | POA: Diagnosis not present

## 2024-01-07 DIAGNOSIS — R296 Repeated falls: Secondary | ICD-10-CM | POA: Diagnosis not present

## 2024-01-07 DIAGNOSIS — Z8781 Personal history of (healed) traumatic fracture: Secondary | ICD-10-CM | POA: Diagnosis not present

## 2024-01-07 DIAGNOSIS — K838 Other specified diseases of biliary tract: Secondary | ICD-10-CM | POA: Diagnosis not present

## 2024-01-07 DIAGNOSIS — Z85828 Personal history of other malignant neoplasm of skin: Secondary | ICD-10-CM | POA: Diagnosis not present

## 2024-01-07 DIAGNOSIS — M47812 Spondylosis without myelopathy or radiculopathy, cervical region: Secondary | ICD-10-CM | POA: Diagnosis not present

## 2024-01-07 DIAGNOSIS — I10 Essential (primary) hypertension: Secondary | ICD-10-CM | POA: Diagnosis not present

## 2024-01-07 DIAGNOSIS — Z87442 Personal history of urinary calculi: Secondary | ICD-10-CM | POA: Diagnosis not present

## 2024-01-07 DIAGNOSIS — G8929 Other chronic pain: Secondary | ICD-10-CM | POA: Diagnosis not present

## 2024-01-07 DIAGNOSIS — Z974 Presence of external hearing-aid: Secondary | ICD-10-CM | POA: Diagnosis not present

## 2024-01-07 DIAGNOSIS — E785 Hyperlipidemia, unspecified: Secondary | ICD-10-CM | POA: Diagnosis not present

## 2024-01-07 DIAGNOSIS — M1611 Unilateral primary osteoarthritis, right hip: Secondary | ICD-10-CM | POA: Diagnosis not present

## 2024-01-07 MED ORDER — BACITRACIN ZINC 500 UNIT/GM EX OINT
TOPICAL_OINTMENT | Freq: Two times a day (BID) | CUTANEOUS | Status: DC
  Administered 2024-01-07: 1 via TOPICAL
  Filled 2024-01-07: qty 0.9

## 2024-01-07 MED ORDER — LIDOCAINE-EPINEPHRINE (PF) 2 %-1:200000 IJ SOLN
10.0000 mL | Freq: Once | INTRAMUSCULAR | Status: AC
  Administered 2024-01-07: 10 mL
  Filled 2024-01-07: qty 20

## 2024-01-07 NOTE — Progress Notes (Signed)
 CT c spine reviewed which shows a right lamina fracture of C1 ring. This is not an unstable fracture. Recommend aspen collar and follow up in office in 2 weeks with xrays.

## 2024-01-07 NOTE — Discharge Instructions (Addendum)
 You had 16 staples placed today.  These will need to be removed in 7-10 days.  These can be removed by your primary care doctor or return to the emergency room to have these removed.  Follow-up with the neurosurgeon.  Their information is listed above.  Please call their office to schedule this follow-up.  Return for any emergent symptoms. Keep the hard collar on at all times. Apply Neosporin over the wound. Any signs of infection return for evaluation.  These include drainage from the wound, fever.

## 2024-01-07 NOTE — ED Provider Triage Note (Signed)
 Emergency Medicine Provider Triage Evaluation Note  Joe Robertson , a 83 y.o. male  was evaluated in triage.  Pt complains of head injury, with an 8 cm head laceration.  Currently on baby aspirin .  No LOC.  No other complaints.  Review of Systems  Positive: Wound, back pain Negative: Headache, weakness, dizziness   Physical Exam  BP (!) 152/80   Pulse 92   Temp 98.2 F (36.8 C)   Resp 18   Ht 5\' 11"  (1.803 m)   Wt 88.5 kg   SpO2 96%   BMI 27.21 kg/m  Gen:   Awake, no distress   Resp:  Normal effort  MSK:   Moves extremities without difficulty  Other:  8cm laceration present, bleeding controlled  Medical Decision Making  Medically screening exam initiated at 7:32 PM.  Appropriate orders placed.  Joe Robertson was informed that the remainder of the evaluation will be completed by another provider, this initial triage assessment does not replace that evaluation, and the importance of remaining in the ED until their evaluation is complete.     Shlomo Seres, PA-C 01/07/24 1936

## 2024-01-07 NOTE — ED Notes (Signed)
 C-Collar placed in triage with this RN and [place], [place].

## 2024-01-07 NOTE — ED Triage Notes (Signed)
 Pt states that he sat down in a chair, flipped backwards hitting his head on a cedar chest. Pt has a laceration to the back of head, bleeding controlled at this time. No thinners, no LOC

## 2024-01-07 NOTE — ED Provider Notes (Signed)
 [place] EMERGENCY DEPARTMENT AT [facility] Provider Note   CSN: [id number] Arrival date & time: 01/07/24  1859     History  Chief Complaint  Patient presents with   Joe Robertson is a 83 y.o. male.  83 year old male presents with his wife and daughter for concern of a fall that occurred earlier.  C-collar is in place.  He states he was sitting on a chair and the chair tipped backwards causing him to fall onto a cedar chest.  Does have a laceration.  No loss of consciousness.  Not on any blood thinning medicine.  No other complaints.  The history is provided by the patient. No language interpreter was used.       Home Medications Prior to Admission medications   Medication Sig Start Date End Date Taking? Authorizing Provider  acetaminophen  (TYLENOL ) 650 MG CR tablet Take 650 mg by mouth daily as needed.    [provider]  aspirin  EC 81 MG EC tablet Take 1 tablet (81 mg total) by mouth daily. 11/03/19   Rodman Clam, NP  cyanocobalamin  (VITAMIN B12) 1000 MCG tablet Take 1,000 mcg by mouth daily.    [provider]  rosuvastatin  (CRESTOR ) 10 MG tablet TAKE 1 TABLET(10 MG) BY MOUTH DAILY 11/03/23   Colene Dauphin, MD  traMADol  (ULTRAM ) 50 MG tablet Take 1-2 tablets (50-100 mg total) by mouth every 8 (eight) hours as needed for severe pain (pain score 7-10). 10/28/23   Colene Dauphin, MD      Allergies    Simvastatin and Lipitor [atorvastatin]    Review of Systems   Review of Systems  Constitutional:  Negative for chills and fever.  Gastrointestinal:  Negative for abdominal pain.  Neurological:  Positive for headaches. Negative for syncope.  All other systems reviewed and are negative.   Physical Exam Updated Vital Signs BP (!) 145/85   Pulse 91   Temp 98.2 F (36.8 C) (Oral)   Resp 18   Ht 5\' 11"  (1.803 m)   Wt 88.5 kg   SpO2 96%   BMI 27.21 kg/m  Physical Exam Vitals and nursing note reviewed.  Constitutional:       General: He is not in acute distress.    Appearance: Normal appearance. He is not ill-appearing.  HENT:     Head: Normocephalic and atraumatic.     Comments: Laceration to scalp noted.  About 15 cm.    Nose: Nose normal.  Eyes:     Conjunctiva/sclera: Conjunctivae normal.  Neck:     Comments: C collar in place Cardiovascular:     Rate and Rhythm: Normal rate and regular rhythm.  Pulmonary:     Effort: Pulmonary effort is normal. No respiratory distress.  Musculoskeletal:        General: No deformity. Normal range of motion.  Skin:    Findings: No rash.  Neurological:     Mental Status: He is alert.     ED Results / Procedures / Treatments   Labs (all labs ordered are listed, but only abnormal results are displayed) Labs Reviewed - No data to display  EKG None  Radiology CT HEAD WO CONTRAST ( ) Result Date: 01/07/2024 CLINICAL DATA:  Head trauma EXAM: CT HEAD WITHOUT CONTRAST TECHNIQUE: Contiguous axial images were obtained from the base of the skull through the vertex without intravenous contrast. RADIATION DOSE REDUCTION: This exam was performed according to the departmental dose-optimization program which includes automated exposure control,  adjustment of the mA and/or kV according to patient size and/or use of iterative reconstruction technique. COMPARISON:  CT brain 08/11/2023, 02/03/2023 FINDINGS: Brain: Negative for acute territorial infarction or intracranial mass. Atrophy and chronic small vessel ischemic changes of the white matter. Stable ventricle size. Asymmetrical enlargement of the left greater than right extra-axial spaces. Difficult to exclude small more chronic appearing left subdural collection, measures no more than 4 mm thickness and no change in appearance compared with previous exams. No hyperdense acute blood on the current exam. Vascular: No hyperdense vessels.  Carotid vascular calcification Skull: No acute skull fracture. Incompletely visualized fracture  through the right posterior arch of C1 Sinuses/Orbits: No acute finding. Other: Small right posterior scalp laceration IMPRESSION: 1. No definite CT evidence for acute intracranial abnormality. 2. Atrophy and chronic small vessel ischemic changes of the white matter. 3. Chronic asymmetric enlargement of the left greater than right extra-axial spaces, questionable for chronic subdural collection but no acute hemorrhage on this exam. 4. Incompletely visualized fracture through the right posterior arch of C1. See separately dictated cervical CT Electronically Signed   By: Esmeralda Hedge M.D.   On: 01/07/2024 20:38   CT Cervical Spine Wo Contrast Result Date: 01/07/2024 CLINICAL DATA:  Recent fall from chair with neck pain, initial encounter EXAM: CT CERVICAL SPINE WITHOUT CONTRAST TECHNIQUE: Multidetector CT imaging of the cervical spine was performed without intravenous contrast. Multiplanar CT image reconstructions were also generated. RADIATION DOSE REDUCTION: This exam was performed according to the departmental dose-optimization program which includes automated exposure control, adjustment of the mA and/or kV according to patient size and/or use of iterative reconstruction technique. COMPARISON:  08/11/2023 FINDINGS: Alignment: Loss of the normal cervical lordosis is noted. Skull base and vertebrae: 7 cervical segments are well visualized. Vertebral body height is well maintained. Mildly displaced fracture through the C1 lamina on the right is noted best seen on image number 26 of series 2 and image number 27 of series 7. This is new from the prior exam. No other fracture is seen. Multilevel facet hypertrophic changes and osteophytic changes are seen. No other focal bony abnormality is noted. Soft tissues and spinal canal: Surrounding soft tissue structures are within normal limits. Upper chest: Visualized lung apices are within normal limits. Other: None IMPRESSION: Fracture through the right posterior lamina  at C1. No separation of the lateral masses is seen. No completion fracture through the anterior aspect of the C1 ring is noted. Degenerative changes Electronically Signed   By: Violeta Grey M.D.   On: 01/07/2024 20:32    Procedures .Joe AasLac repair Deanna Boehlke  Date/Time: 01/07/2024 10:53 PM  Performed by: Lucina Sabal, PA-C Authorized by: Lucina Sabal, PA-C   Consent:    Consent obtained:  Verbal   Consent given by:  Patient   Risks discussed:  Need for additional repair, infection, retained foreign body, poor cosmetic result and poor wound healing   Alternatives discussed:  No treatment Universal protocol:    Procedure explained and questions answered to patient or proxy's satisfaction: yes     Relevant documents present and verified: yes     Patient identity confirmed:  Verbally with patient and arm band Laceration details:    Length (cm):  15 Pre-procedure details:    Preparation:  Patient was prepped and draped in usual sterile fashion Treatment:    Area cleansed with:  Saline and povidone-iodine   Amount of cleaning:  Extensive   Irrigation solution:  Sterile saline   Irrigation volume:  500   Irrigation method:  Tap   Debridement:  None   Undermining:  None Skin repair:    Repair method:  Staples   Number of staples:  16 Approximation:    Approximation:  Close Repair type:    Repair type:  Simple Post-procedure details:    Dressing:  Non-adherent dressing and antibiotic ointment   Procedure completion:  Tolerated well, no immediate complications     Medications Ordered in ED Medications  lidocaine -EPINEPHrine  (XYLOCAINE  W/EPI) 2 %-1:200000 (PF) injection 10 mL (10 mLs Infiltration Given 01/07/24 2159)    ED Course/ Medical Decision Making/ A&P                                 Medical Decision Making Risk Prescription drug management.   Medical Decision Making / ED Course   This patient presents to the ED for concern of fall, head laceration, this involves an  extensive number of treatment options, and is a complaint that carries with it a high risk of complications and morbidity.  The differential diagnosis includes intracranial injury, cervical spine fracture, laceration  MDM: 83 year old male presents with above-mentioned plan.  Overall well-appearing.  CT head and C-spine obtained.  No acute intracranial injury. CT C-spine with posterior arch C1 fracture. Discussed with neurosurgery.  Recommend hard collar for 6-12 weeks and follow-up with their office in 2 weeks.  Laceration repaired.  See attached procedure note.  Return precaution discussed.  Joe Robertson  Discharged in stable condition.   Additional history obtained: -Additional history obtained from family at bedside -External records from outside source obtained and reviewed including: Chart review including previous notes, labs, imaging, consultation notes   Lab Tests: -I ordered, reviewed, and interpreted labs.   The pertinent results include:   Labs Reviewed - No data to display    EKG  EKG Interpretation Date/Time:    Ventricular Rate:    PR Interval:    QRS Duration:    QT Interval:    QTC Calculation:   R Axis:      Text Interpretation:           Imaging Studies ordered: I ordered imaging studies including CT head, CT C-spine I independently visualized and interpreted imaging. I agree with the radiologist interpretation   Medicines ordered and prescription drug management: Meds ordered this encounter  Medications   lidocaine -EPINEPHrine  (XYLOCAINE  W/EPI) 2 %-1:200000 (PF) injection 10 mL   bacitracin ointment    -I have reviewed the patients home medicines and have made adjustments as needed  Social Determinants of Health:  Factors impacting patients care include: Good family support   Reevaluation: After the interventions noted above, I reevaluated the patient and found that they have :improved  Co morbidities that complicate the patient  evaluation  Past Medical History:  Diagnosis Date   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  Return precaution discussed.  Patient voices understanding and is in agreement with plan.  Final Clinical Impression(s) / ED Diagnoses Final diagnoses:  Fall, initial encounter  Injury of head, initial encounter  Laceration of scalp, initial encounter  Closed displaced fracture of posterior arch of first cervical vertebra, initial encounter [facility])    Rx / DC Orders ED Discharge Orders     None         Lucina Sabal, PA-C 01/07/24 2254    Mozell Arias, MD 01/07/24 2312

## 2024-01-08 ENCOUNTER — Telehealth: Payer: Self-pay

## 2024-01-08 ENCOUNTER — Telehealth: Payer: Self-pay | Admitting: Sports Medicine

## 2024-01-08 NOTE — Telephone Encounter (Signed)
 Patient's daughter called stating that Joe Robertson fell yesterday. He has an 8 inch gash in the back of his head with 16 staples and a C1 fracture.  She asked if Joe Robertson could call her to discuss this? She can be reached at # [phone].

## 2024-01-08 NOTE — Telephone Encounter (Signed)
  Patient is not a patient at this facility please, route to correct facility!      Copied from CRM [id number]. Topic: Referral - Request for Referral >> Jan 08, 2024 11:46 AM Rosamond Comes wrote: Did the patient discuss referral with their provider in the last year? No (If No - schedule appointment) (If Yes - send message)  Appointment offered? Yes  Type of order/referral and detailed reason for visit: ED provider stated patient needs to see a neurologist  Preference of office, provider, location: N/A  If referral order, have you been seen by this specialty before? No (If Yes, this issue or another issue? When? Where?  Can we respond through MyChart? No  Patient has appt scheduled for 12/20/23

## 2024-01-08 NOTE — Telephone Encounter (Signed)
Called and spoke with patient's daughter.

## 2024-01-11 DIAGNOSIS — Z974 Presence of external hearing-aid: Secondary | ICD-10-CM | POA: Diagnosis not present

## 2024-01-11 DIAGNOSIS — G8929 Other chronic pain: Secondary | ICD-10-CM | POA: Diagnosis not present

## 2024-01-11 DIAGNOSIS — E538 Deficiency of other specified B group vitamins: Secondary | ICD-10-CM | POA: Diagnosis not present

## 2024-01-11 DIAGNOSIS — M8448XG Pathological fracture, other site, subsequent encounter for fracture with delayed healing: Secondary | ICD-10-CM | POA: Diagnosis not present

## 2024-01-11 DIAGNOSIS — M4856XD Collapsed vertebra, not elsewhere classified, lumbar region, subsequent encounter for fracture with routine healing: Secondary | ICD-10-CM | POA: Diagnosis not present

## 2024-01-11 DIAGNOSIS — K838 Other specified diseases of biliary tract: Secondary | ICD-10-CM | POA: Diagnosis not present

## 2024-01-11 DIAGNOSIS — Z9181 History of falling: Secondary | ICD-10-CM | POA: Diagnosis not present

## 2024-01-11 DIAGNOSIS — M1611 Unilateral primary osteoarthritis, right hip: Secondary | ICD-10-CM | POA: Diagnosis not present

## 2024-01-11 DIAGNOSIS — E785 Hyperlipidemia, unspecified: Secondary | ICD-10-CM | POA: Diagnosis not present

## 2024-01-11 DIAGNOSIS — Z8781 Personal history of (healed) traumatic fracture: Secondary | ICD-10-CM | POA: Diagnosis not present

## 2024-01-11 DIAGNOSIS — M25512 Pain in left shoulder: Secondary | ICD-10-CM | POA: Diagnosis not present

## 2024-01-11 DIAGNOSIS — Z87442 Personal history of urinary calculi: Secondary | ICD-10-CM | POA: Diagnosis not present

## 2024-01-11 DIAGNOSIS — N400 Enlarged prostate without lower urinary tract symptoms: Secondary | ICD-10-CM | POA: Diagnosis not present

## 2024-01-11 DIAGNOSIS — E559 Vitamin D deficiency, unspecified: Secondary | ICD-10-CM | POA: Diagnosis not present

## 2024-01-11 DIAGNOSIS — R7303 Prediabetes: Secondary | ICD-10-CM | POA: Diagnosis not present

## 2024-01-11 DIAGNOSIS — F0280 Dementia in other diseases classified elsewhere without behavioral disturbance: Secondary | ICD-10-CM | POA: Diagnosis not present

## 2024-01-11 DIAGNOSIS — R296 Repeated falls: Secondary | ICD-10-CM | POA: Diagnosis not present

## 2024-01-11 DIAGNOSIS — Z7982 Long term (current) use of aspirin: Secondary | ICD-10-CM | POA: Diagnosis not present

## 2024-01-11 DIAGNOSIS — G479 Sleep disorder, unspecified: Secondary | ICD-10-CM | POA: Diagnosis not present

## 2024-01-11 DIAGNOSIS — Z8673 Personal history of transient ischemic attack (TIA), and cerebral infarction without residual deficits: Secondary | ICD-10-CM | POA: Diagnosis not present

## 2024-01-11 DIAGNOSIS — Z85828 Personal history of other malignant neoplasm of skin: Secondary | ICD-10-CM | POA: Diagnosis not present

## 2024-01-11 DIAGNOSIS — I10 Essential (primary) hypertension: Secondary | ICD-10-CM | POA: Diagnosis not present

## 2024-01-11 DIAGNOSIS — M6748 Ganglion, other site: Secondary | ICD-10-CM | POA: Diagnosis not present

## 2024-01-14 DIAGNOSIS — Z9181 History of falling: Secondary | ICD-10-CM | POA: Diagnosis not present

## 2024-01-14 DIAGNOSIS — Z8781 Personal history of (healed) traumatic fracture: Secondary | ICD-10-CM | POA: Diagnosis not present

## 2024-01-14 DIAGNOSIS — Z974 Presence of external hearing-aid: Secondary | ICD-10-CM | POA: Diagnosis not present

## 2024-01-14 DIAGNOSIS — Z8673 Personal history of transient ischemic attack (TIA), and cerebral infarction without residual deficits: Secondary | ICD-10-CM | POA: Diagnosis not present

## 2024-01-14 DIAGNOSIS — M6748 Ganglion, other site: Secondary | ICD-10-CM | POA: Diagnosis not present

## 2024-01-14 DIAGNOSIS — M25512 Pain in left shoulder: Secondary | ICD-10-CM | POA: Diagnosis not present

## 2024-01-14 DIAGNOSIS — E538 Deficiency of other specified B group vitamins: Secondary | ICD-10-CM | POA: Diagnosis not present

## 2024-01-14 DIAGNOSIS — E785 Hyperlipidemia, unspecified: Secondary | ICD-10-CM | POA: Diagnosis not present

## 2024-01-14 DIAGNOSIS — K838 Other specified diseases of biliary tract: Secondary | ICD-10-CM | POA: Diagnosis not present

## 2024-01-14 DIAGNOSIS — M4856XD Collapsed vertebra, not elsewhere classified, lumbar region, subsequent encounter for fracture with routine healing: Secondary | ICD-10-CM | POA: Diagnosis not present

## 2024-01-14 DIAGNOSIS — I10 Essential (primary) hypertension: Secondary | ICD-10-CM | POA: Diagnosis not present

## 2024-01-14 DIAGNOSIS — F0280 Dementia in other diseases classified elsewhere without behavioral disturbance: Secondary | ICD-10-CM | POA: Diagnosis not present

## 2024-01-14 DIAGNOSIS — Z85828 Personal history of other malignant neoplasm of skin: Secondary | ICD-10-CM | POA: Diagnosis not present

## 2024-01-14 DIAGNOSIS — M1611 Unilateral primary osteoarthritis, right hip: Secondary | ICD-10-CM | POA: Diagnosis not present

## 2024-01-14 DIAGNOSIS — Z87442 Personal history of urinary calculi: Secondary | ICD-10-CM | POA: Diagnosis not present

## 2024-01-14 DIAGNOSIS — E559 Vitamin D deficiency, unspecified: Secondary | ICD-10-CM | POA: Diagnosis not present

## 2024-01-14 DIAGNOSIS — Z7982 Long term (current) use of aspirin: Secondary | ICD-10-CM | POA: Diagnosis not present

## 2024-01-14 DIAGNOSIS — G8929 Other chronic pain: Secondary | ICD-10-CM | POA: Diagnosis not present

## 2024-01-14 DIAGNOSIS — R7303 Prediabetes: Secondary | ICD-10-CM | POA: Diagnosis not present

## 2024-01-14 DIAGNOSIS — R296 Repeated falls: Secondary | ICD-10-CM | POA: Diagnosis not present

## 2024-01-14 DIAGNOSIS — M8448XG Pathological fracture, other site, subsequent encounter for fracture with delayed healing: Secondary | ICD-10-CM | POA: Diagnosis not present

## 2024-01-14 DIAGNOSIS — G479 Sleep disorder, unspecified: Secondary | ICD-10-CM | POA: Diagnosis not present

## 2024-01-14 DIAGNOSIS — N400 Enlarged prostate without lower urinary tract symptoms: Secondary | ICD-10-CM | POA: Diagnosis not present

## 2024-01-15 ENCOUNTER — Encounter: Payer: Self-pay | Admitting: Internal Medicine

## 2024-01-15 DIAGNOSIS — S0101XA Laceration without foreign body of scalp, initial encounter: Secondary | ICD-10-CM | POA: Insufficient documentation

## 2024-01-15 DIAGNOSIS — S12000A Unspecified displaced fracture of first cervical vertebra, initial encounter for closed fracture: Secondary | ICD-10-CM | POA: Insufficient documentation

## 2024-01-15 NOTE — Patient Instructions (Incomplete)
     The staples were removed from your scalp.  Continue to apply antibacterial ointment and monitor the wound for infection (increasing redness, pain or discharge).    Medications changes include :   None    A referral was ordered and someone will call you to schedule an appointment.     No follow-ups on file.

## 2024-01-15 NOTE — Progress Notes (Addendum)
 Subjective:    Patient ID: Joe Robertson, male    DOB: [birth year], 83 y.o.   MRN: [id number]     HPI Irish is here for follow up from the ED. he is here with his wife and son  ED 01/07/24 after fall at home -he was sitting on the chair and the chair tipped backwards causing him to fall onto his cedar chest.  There is no LOC.  CT head and cervical spine showed no acute intracranial abnormality.  Atrophy and chronic small vessel ischemic changes of the white matter noted.  Chronic asymmetric enlargement of the left greater than right extra-axial spaces, questionable for chronic subdural collection but no acute hemorrhage on this exam.  Incompletely visualized fracture to the right posterior arch of C1.  CT of cervical neck showed fracture through the right posterior lamina of C1.  No separation of the lateral masses.  No completion fractures to the anterior aspect of C1 ring is noted.  He did have a laceration of his scalp approximately 15 cm in length it was closed with 16 staples.  C1 fracture discussed with neurosurgery and they recommended hard collar for 6-12 weeks and follow-up in the office after 2 weeks.  He has no complaints.  He denies any pain.  He denies headaches, neck pain his wife states he has not complained of any pain.  He has not been wearing the neck brace consistently.  He is not wearing it currently.   Continues to have significant memory issues.  Family has not wanted formal evaluation.  His family often reminds him to use the walker and not to go outside or do certain things and he does not always remember.  His wife is his caregiver.  Medications and allergies reviewed with patient and updated if appropriate.  Current Outpatient Medications on File Prior to Visit  Medication Sig Dispense Refill   acetaminophen  (TYLENOL ) 650 MG CR tablet Take 650 mg by mouth daily as needed.     aspirin  EC 81 MG EC tablet Take 1 tablet (81 mg total) by mouth daily.      cyanocobalamin  (VITAMIN B12) 1000 MCG tablet Take 1,000 mcg by mouth daily.     rosuvastatin  (CRESTOR ) 10 MG tablet TAKE 1 TABLET(10 MG) BY MOUTH DAILY 90 tablet 3   traMADol  (ULTRAM ) 50 MG tablet Take 1-2 tablets (50-100 mg total) by mouth every 8 (eight) hours as needed for severe pain (pain score 7-10). 40 tablet 0   No current facility-administered medications on file prior to visit.     Review of Systems  Constitutional:  Negative for fever.  Eyes:  Negative for visual disturbance.  Gastrointestinal:  Negative for nausea.  Musculoskeletal:  Negative for neck pain.  Neurological:  Negative for light-headedness and headaches.       Objective:   Vitals:   01/16/24 0853  BP: 138/70  Pulse: 96  Temp: 98.1 F (36.7 C)  SpO2: 96%   BP Readings from Last 3 Encounters:  01/16/24 138/70  01/07/24 (!) 150/80  08/27/23 (!) 150/98   Wt Readings from Last 3 Encounters:  01/16/24 201 lb (91.2 kg)  01/07/24 195 lb 1.7 oz (88.5 kg)  12/01/23 195 lb (88.5 kg)   Body mass index is 28.03 kg/m.    Physical Exam Constitutional:      General: He is not in acute distress.    Appearance: Normal appearance. He is not ill-appearing.  HENT:     Head: Normocephalic.  Comments: Posterior head laceration that is scabbed with 16 staples-no surrounding erythema.  Trace swelling, minimal tenderness Cardiovascular:     Rate and Rhythm: Normal rate and regular rhythm.  Pulmonary:     Effort: Pulmonary effort is normal. No respiratory distress.     Breath sounds: Normal breath sounds. No wheezing or rales.  Musculoskeletal:     Right lower leg: No edema.     Left lower leg: No edema.  Skin:    General: Skin is warm and dry.     Findings: No erythema or rash.  Neurological:     Mental Status: He is alert.     Suture Removal  Date/Time: 01/16/2024 12:35 PM  Performed by: Colene Dauphin, MD Authorized by: Colene Dauphin, MD  Body area: head/neck Location details: scalp Wound  Appearance: clean and tender (Minimal tenderness with palpation.  No surrounding erythema.  Scabbed) Staples Removed: 16 Patient tolerance: patient tolerated the procedure well with no immediate complications         Lab Results  Component Value Date   WBC 8.2 08/07/2023   HGB 16.0 08/07/2023   HCT 49.0 08/07/2023   PLT 225.0 08/07/2023   GLUCOSE 90 08/07/2023   CHOL 219 (H) 08/07/2023   TRIG 123.0 08/07/2023   HDL 34.90 (L) 08/07/2023   LDLDIRECT 147.0 02/02/2018   LDLCALC 160 (H) 08/07/2023   ALT 18 08/07/2023   AST 19 08/07/2023   NA 140 08/07/2023   K 4.3 08/07/2023   CL 104 08/07/2023   CREATININE 1.26 08/07/2023   BUN 15 08/07/2023   CO2 27 08/07/2023   TSH 2.02 08/07/2023   INR 1.0 10/30/2019   HGBA1C 5.7 08/07/2023    CT HEAD WO CONTRAST ( ) CLINICAL DATA:  Head trauma  EXAM: CT HEAD WITHOUT CONTRAST  TECHNIQUE: Contiguous axial images were obtained from the base of the skull through the vertex without intravenous contrast.  RADIATION DOSE REDUCTION: This exam was performed according to the departmental dose-optimization program which includes automated exposure control, adjustment of the mA and/or kV according to patient size and/or use of iterative reconstruction technique.  COMPARISON:  CT brain 08/11/2023, 02/03/2023  FINDINGS: Brain: Negative for acute territorial infarction or intracranial mass. Atrophy and chronic small vessel ischemic changes of the white matter. Stable ventricle size.  Asymmetrical enlargement of the left greater than right extra-axial spaces. Difficult to exclude small more chronic appearing left subdural collection, measures no more than 4 mm thickness and no change in appearance compared with previous exams. No hyperdense acute blood on the current exam.  Vascular: No hyperdense vessels.  Carotid vascular calcification  Skull: No acute skull fracture. Incompletely visualized fracture through the right posterior  arch of C1  Sinuses/Orbits: No acute finding.  Other: Small right posterior scalp laceration  IMPRESSION: 1. No definite CT evidence for acute intracranial abnormality. 2. Atrophy and chronic small vessel ischemic changes of the white matter. 3. Chronic asymmetric enlargement of the left greater than right extra-axial spaces, questionable for chronic subdural collection but no acute hemorrhage on this exam. 4. Incompletely visualized fracture through the right posterior arch of C1. See separately dictated cervical CT  Electronically Signed   By: Esmeralda Hedge M.D.   On: 01/07/2024 20:38 CT Cervical Spine Wo Contrast CLINICAL DATA:  Recent fall from chair with neck pain, initial encounter  EXAM: CT CERVICAL SPINE WITHOUT CONTRAST  TECHNIQUE: Multidetector CT imaging of the cervical spine was performed without intravenous contrast. Multiplanar CT image reconstructions were  also generated.  RADIATION DOSE REDUCTION: This exam was performed according to the departmental dose-optimization program which includes automated exposure control, adjustment of the mA and/or kV according to patient size and/or use of iterative reconstruction technique.  COMPARISON:  08/11/2023  FINDINGS: Alignment: Loss of the normal cervical lordosis is noted.  Skull base and vertebrae: 7 cervical segments are well visualized. Vertebral body height is well maintained. Mildly displaced fracture through the C1 lamina on the right is noted best seen on image number 26 of series 2 and image number 27 of series 7. This is new from the prior exam. No other fracture is seen. Multilevel facet hypertrophic changes and osteophytic changes are seen. No other focal bony abnormality is noted.  Soft tissues and spinal canal: Surrounding soft tissue structures are within normal limits.  Upper chest: Visualized lung apices are within normal limits.  Other: None  IMPRESSION: Fracture through the right  posterior lamina at C1. No separation of the lateral masses is seen. No completion fracture through the anterior aspect of the C1 ring is noted.  Degenerative changes  Electronically Signed   By: Violeta Grey M.D.   On: 01/07/2024 20:32    Assessment & Plan:    See Problem List for Assessment and Plan of chronic medical problems.

## 2024-01-16 ENCOUNTER — Ambulatory Visit: Admitting: Internal Medicine

## 2024-01-16 VITALS — BP 138/70 | HR 96 | Temp 98.1°F | Ht 71.0 in | Wt 201.0 lb

## 2024-01-16 DIAGNOSIS — R296 Repeated falls: Secondary | ICD-10-CM

## 2024-01-16 DIAGNOSIS — S0101XD Laceration without foreign body of scalp, subsequent encounter: Secondary | ICD-10-CM | POA: Diagnosis not present

## 2024-01-16 DIAGNOSIS — E782 Mixed hyperlipidemia: Secondary | ICD-10-CM

## 2024-01-16 DIAGNOSIS — Z4802 Encounter for removal of sutures: Secondary | ICD-10-CM

## 2024-01-16 DIAGNOSIS — S0101XA Laceration without foreign body of scalp, initial encounter: Secondary | ICD-10-CM

## 2024-01-16 DIAGNOSIS — S12031A Nondisplaced posterior arch fracture of first cervical vertebra, initial encounter for closed fracture: Secondary | ICD-10-CM | POA: Diagnosis not present

## 2024-01-16 NOTE — Assessment & Plan Note (Signed)
 Chronic Has improved since he is using a walker consistently His family has done everything to help reduce his falling Stressed that he needs to continue to use the walker consistently

## 2024-01-16 NOTE — Assessment & Plan Note (Signed)
 Acute Posterior head Related to recent fall 16 staples removed today without difficulty Laceration is healing well-scabbed without surrounding erythema.  Mild swelling and tenderness with palpation

## 2024-01-16 NOTE — Assessment & Plan Note (Signed)
 Acute Related to a recent fall Not currently wearing his neck brace and has not been wearing it consistently, which is partially related to his dementia and him not understanding the importance and when reminded he likely quickly forgets Stressed that he needs to be wearing the neck brace continuously Has follow-up with neurosurgery next week Denies any pain

## 2024-01-16 NOTE — Assessment & Plan Note (Signed)
 Chronic History of stroke Continue rosuvastatin  10 mg daily

## 2024-01-22 DIAGNOSIS — S12031A Nondisplaced posterior arch fracture of first cervical vertebra, initial encounter for closed fracture: Secondary | ICD-10-CM | POA: Diagnosis not present

## 2024-01-23 DIAGNOSIS — E559 Vitamin D deficiency, unspecified: Secondary | ICD-10-CM | POA: Diagnosis not present

## 2024-01-23 DIAGNOSIS — M8448XG Pathological fracture, other site, subsequent encounter for fracture with delayed healing: Secondary | ICD-10-CM | POA: Diagnosis not present

## 2024-01-23 DIAGNOSIS — I10 Essential (primary) hypertension: Secondary | ICD-10-CM | POA: Diagnosis not present

## 2024-01-23 DIAGNOSIS — E538 Deficiency of other specified B group vitamins: Secondary | ICD-10-CM | POA: Diagnosis not present

## 2024-01-23 DIAGNOSIS — G479 Sleep disorder, unspecified: Secondary | ICD-10-CM | POA: Diagnosis not present

## 2024-01-23 DIAGNOSIS — G8929 Other chronic pain: Secondary | ICD-10-CM | POA: Diagnosis not present

## 2024-01-23 DIAGNOSIS — K838 Other specified diseases of biliary tract: Secondary | ICD-10-CM | POA: Diagnosis not present

## 2024-01-23 DIAGNOSIS — M25512 Pain in left shoulder: Secondary | ICD-10-CM | POA: Diagnosis not present

## 2024-01-23 DIAGNOSIS — Z974 Presence of external hearing-aid: Secondary | ICD-10-CM | POA: Diagnosis not present

## 2024-01-23 DIAGNOSIS — Z87442 Personal history of urinary calculi: Secondary | ICD-10-CM | POA: Diagnosis not present

## 2024-01-23 DIAGNOSIS — Z8673 Personal history of transient ischemic attack (TIA), and cerebral infarction without residual deficits: Secondary | ICD-10-CM | POA: Diagnosis not present

## 2024-01-23 DIAGNOSIS — Z8781 Personal history of (healed) traumatic fracture: Secondary | ICD-10-CM | POA: Diagnosis not present

## 2024-01-23 DIAGNOSIS — R296 Repeated falls: Secondary | ICD-10-CM | POA: Diagnosis not present

## 2024-01-23 DIAGNOSIS — M6748 Ganglion, other site: Secondary | ICD-10-CM | POA: Diagnosis not present

## 2024-01-23 DIAGNOSIS — Z9181 History of falling: Secondary | ICD-10-CM | POA: Diagnosis not present

## 2024-01-23 DIAGNOSIS — N400 Enlarged prostate without lower urinary tract symptoms: Secondary | ICD-10-CM | POA: Diagnosis not present

## 2024-01-23 DIAGNOSIS — E785 Hyperlipidemia, unspecified: Secondary | ICD-10-CM | POA: Diagnosis not present

## 2024-01-23 DIAGNOSIS — F0280 Dementia in other diseases classified elsewhere without behavioral disturbance: Secondary | ICD-10-CM | POA: Diagnosis not present

## 2024-01-23 DIAGNOSIS — M4856XD Collapsed vertebra, not elsewhere classified, lumbar region, subsequent encounter for fracture with routine healing: Secondary | ICD-10-CM | POA: Diagnosis not present

## 2024-01-23 DIAGNOSIS — M1611 Unilateral primary osteoarthritis, right hip: Secondary | ICD-10-CM | POA: Diagnosis not present

## 2024-01-23 DIAGNOSIS — Z7982 Long term (current) use of aspirin: Secondary | ICD-10-CM | POA: Diagnosis not present

## 2024-01-23 DIAGNOSIS — R7303 Prediabetes: Secondary | ICD-10-CM | POA: Diagnosis not present

## 2024-01-23 DIAGNOSIS — Z85828 Personal history of other malignant neoplasm of skin: Secondary | ICD-10-CM | POA: Diagnosis not present

## 2024-01-28 ENCOUNTER — Other Ambulatory Visit: Payer: Self-pay | Admitting: Internal Medicine

## 2024-01-28 DIAGNOSIS — R4189 Other symptoms and signs involving cognitive functions and awareness: Secondary | ICD-10-CM

## 2024-01-29 NOTE — Addendum Note (Signed)
 Addended by: Colene Dauphin on: 01/29/2024 07:57 AM   Modules accepted: Orders

## 2024-02-03 NOTE — Progress Notes (Signed)
 [facility] [address] [place]. [place] [address] [phone]       STROKE FOLLOW UP NOTE  [name] Date of Birth:  1940/11/05 Medical Record Number:  [id number]   Reason for Referral: stroke follow up    SUBJECTIVE:   CHIEF COMPLAINT:  Chief Complaint  Patient presents with   Memory Loss    Rm 8 with spouse Pt is well, spouse reports he has had a decline in short term memory, he is repetitive, has had frequent falls and becomes agitated easily.      HPI:   Update 02/04/2024 JM: Patient returns for follow-up visit after PCP referred back to office for cognitive decline (advised 6 month f/u visit with Dr. Janett Medin almost 2 years ago but lost to follow up).  After prior visit, dementia panel completed which was overall satisfactory except B12 low to normal range at 239.  Repeat level with PCP 08/2022 at 168 and started on daily supplement.  Repeat MRI brain 05/2022 showed evidence of chronic right thalamic stroke, mild generalized cortical atrophy and mild chronic microvascular stomach changes and 3 small chronic microhemorrhages in the right parietal and temporal lobes.  He is accompanied by his wife who provides majority of history.  She reports continued cognitive decline primarily with short-term memory, he repeats himself frequently, has had increased falls and gets agitated easily. MMSE today 15/30, previously 19/30 in 05/2022.  She reports gradual decline over the past year.  Agitation has been gradually worsening over the past several months, usually worse in the evening.  He can be confused and will get agitated if he is told he cannot do something such as bring the car to get an oil change at 9 PM, he does not normally drive. He denies any feeling of depression or anxiety.  Reports he sleeps well and appetite good.  Denies any visual hallucinations.  Reports mother had history of Alzheimer's disease, he has 5 siblings without any memory concerns they  are aware of.  He continues on B12 supplement every other day.  He enjoys doing Mahjong puzzles.   He has been having recurrent falls.  He was seen in the ED recently 12/2023 after a fall sitting in a chair that tipped backwards resulting in a head laceration requiring staples for closure and C1 fracture.  He is supposed to be wearing a cervical collar but is noncompliant as it is uncomfortable.  They have follow-up with neurosurgery next week.  Wife reports 2 additional falls since that time but thankfully no significant injury.  He is supposed to be using a rollator walker which he has been better about using routinely but 2 recent falls he was not using.  Recently completed home health PT, was initially prescribed for shoulder pain but was also working on gait/balance.  Declines interest in any additional sessions or outpatient therapy.  No new stroke/TIA symptoms.  Reports compliance on aspirin  and Crestor .  Routinely follows with PCP Dr. Donnette Gal for stroke risk factor management.      History provided for reference purposes only Update 05/21/2022 Dr. Janett Medin: He returns for follow-up after last visit with Camilo Cella nurse practitioner 5 months ago.  He is accompanied by his wife.  He continues to have dizzy spells which did not there is a constant good days and bad days.  He has learned to use a cane most of the time.  He still has a few falls and fell 4 weeks ago and fractured his collarbone.  He denies any vertigo meds.  Patient feels off balance.  Patient was referred at last visit.  He was referred to therapy for vestibular rehab but he did not have a good experience with his therapist decided not to continue as he states he was falsely accused of  inappropriate behavior.  He was advised to undergo MRI scan and CT angiograms which he has not done.  Patient has a new complaint of decreased short-term memory and cognitive difficulties.  He has not had this evaluated yet.  EEG on 01/03/2022 was normal.   Denies headache or new stroke or TIA-like symptoms.  He remains on aspirin  for tolerating well without bruising or bleeding.  States his blood pressure is under good control at home but today it is elevated at 151/50.  He is tolerating Crestor  well without muscle aches and pains.  Lipid profile on 03/04/2022 showed LDL cholesterol to be 95 and hemoglobin A1c today 5.8.  No family history of Alzheimer's or dementia.  He denies any tinnitus or decreased hearing.   Update 12/25/2021 JM: Patient returns for sooner follow-up visit due to ongoing concerns of dizziness.  He is accompanied by his wife.  Previously seen 08/29/2021 reporting intermittent dizziness with presyncope/syncopal events.  Encouraged cardiology evaluation which was completed 1/16 with Dr. Amanda Jungling with completion of 7-day Zio patch which was unremarkable.  No further evaluation or follow-up recommended.  Reports dizziness has been persistent since his stroke in 10/2019 although did not first report this until 03/2020 and had a couple episodes of dizziness prior to hospitalization.  Dizziness has been progressively worsening since prior visit 4 months ago.  Of note, he c/o difficulty sleeping therefore started on trazodone  in January with recent increase to 100 mg nightly. Reports took previously (shortly after stroke) without side effects.   Difficulty getting full details regarding dizziness due to baseline cognitive impairment but overall seems to only occur with position changes either first thing in the morning or occasionally during the day. Can last 5 to 6 minutes although he has not actually timed. At times can be severe where he has difficulty ambulating. He has not had any additional presyncope/syncopal events since 06/2021.  During dizziness episodes, denies headaches, confusion, visual changes, LOC, weakness/numbness, speech changes or altered mental status. He is aware of events, able to communicate with his wife. Denies onset with quick head  or eye movements, after prolonged standing or during increased stressful situations.  Does have baseline hearing loss with use of hearing aids but denies any worsening. He has been previously treated by ENT Dr. Odean Bend for otitis (08/2019). Believes this dizziness has greatly impacted his quality of life. Wife notes generalized weakness and fatigue since his stroke and just not the same. Does ambulate slower with use of cane for long distance, no recent falls. Denies LE numbness/tingling or pains. Denies low back or neck pain. Tries to be active around him home but no formal exercise regimen. Admits to limited to no water intake, will drink hot tea in the morning and occasionally drink a Gatorade.  Orthostatic vitals negative - BP laying 140/85 HR 81, standing 1 min BP 140/87 HR 85 and standing 3 min BP 141/85, HR 85.  Occasionally monitors blood pressure at home, has not yet checked blood pressure during dizziness episode as previously discussed.  Otherwise, stable from stroke standpoint.  Denies new stroke/TIA symptoms.  Compliant on aspirin  and Crestor , denies side effects.  Closely followed by PCP Dr. Donnette Gal.  No further concerns  at this time.  Update 08/29/2021 JM: returns for 6 mo stroke f/u accompanied by his wife.  Overall stable.  Denies new stroke/TIA symptoms. C/o continued intermittent dizziness - did have a fall in November - states he started to feel very dizzy and then blacked out and lose consciousness that he believes only lasted for a few seconds. Once came to, denies confusion or any type of seizure activity. Dizziness typically upon getting up in the morning and randomly during the day. Reports present since stroke but first reported this at visit in 03/2020 with similar presyncope/syncopal episode. Recommend eval by cardiology but never pursued this - no additional syncopal events until recently. Does not check blood pressure with dizziness although checks daily and stable. Admits to limited  water intake. Denies tinnitus or hearing loss. Denies dizziness/vertigo symptoms with quick head position changes. Dizziness is not debilitating - able to ambulate with use of cane. Cognition stable since prior visit.  Compliant on aspirin  and Crestor  -denies side effects.  Blood pressure today 156/88. Has f/u with PCP scheduled this Friday. No further concerns at this time.   Update 02/21/2021 JM: Mr. Duval returns for 34-month stroke follow-up accompanied by his wife, Cornelius Dill.  Stable since prior visit without new stroke/TIA symptoms.  He does report continued intermittent dizziness which has been present since his stroke as well as short-term memory loss which can fluctuate but denies specific worsening.  He continues to ambulate with a cane and denies any recent falls.  He stays active during the day and routinely does memory exercises.  He reports he sleeps well at night and has a good appetite. Reports compliance on aspirin  and Crestor  5 mg three times weekly without associated side effects.  LDL 53 down from 124 on 10/11/2020.  Blood pressure today 139/70.  No further concerns at this time.  Update 08/23/2020 JM: Mr. Ferrone returns for 49-month stroke follow-up accompanied by his wife.  He has been doing very well since prior visit with resolution of prior complaints including dizziness, insomnia and cognitive concerns.  He has unfortunately had a couple falls the first week of December.  Both of them in setting of slipping on a slick surface and unfortunately resulted in left hand fracture but otherwise no other injuries.  Denies falls occurring due to dizziness or lack of balance.  Denies new stroke/TIA symptoms.  Remains on aspirin  and pravastatin  40 mg daily for secondary stroke prevention without side effects.  Lipid panel obtained by PCP 08/21/2020 with LDL 124 therefore Dr. Donnette Gal plans on trialing Crestor  as he has had difficulty tolerating high-dose statins.  Blood pressure today 134/74. Monitors at  home which has been stable and similar to todays reading.  No concerns at this time.  Update 04/18/2020 JM: Mr. Tinoco returns for stroke follow-up accompanied by his wife Complains of post stroke cognitive impairment with short-term memory loss  He also complains of difficulty sleeping at night as well as daytime fatigue and morning grogginess.  He also feels at times he has difficulty laying flat or will wake up gasping for air He has been experiencing dizziness since his stroke - unable to determine if he is experiencing a true dizziness sensation vs vertigo vs imbalance as he will start speaking of a different topic He does report 3 episodes of vision going black (aware of his surroundings), foggy headedness and dizziness resulting in a fall.  He did lose consciousness with 2 of those episodes.  Prior episode occurred on 6/16.  Denies dyspnea, heart palpitation or racing heart type sensation. Apparently, he has spoke to his PCP regarding above concerns but she feels as though more anxiety related He is concerned as there may be something else going on or possibly medication related He has been experiencing greater right hip pain recently receiving injection by orthopedics with benefit Currently using a cane for ambulation Remains on aspirin  and pravastatin  40 mg daily for secondary stroke prevention without side effects Blood pressure today 142/76 Follows closely with PCP for HTN and HLD management No further concerns at this time  Update 12/02/2019 JM:, Mr. Luecke is being seen for hospital follow-up. He has been doing well from a stroke standpoint with residual mild cognitive impairment but denies weakness or numbness/tingling. 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 He has completed 3 weeks DAPT and continues on aspirin  alone without bleeding or bruising.  Continues on pravastatin  40 mg daily tolerating well  without side effects.  Blood pressure today  127/73 with similar home readings. PCP recently initiated losartan  50 mg daily for elevated BP and new diagnosis of HTN.  Also reported difficulty sleeping with increased anxiety and PCP initiated trazodone  50 mg nightly and recently increased to 100 mg nightly.  No further concerns at this time.  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  Evaluated by stroke team and Dr. 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  Recommended DAPT for 3 weeks and aspirin  alone.  LDL 151 and history of statin intolerance with Zocor and Lipitor and patient agreeable to trial of pravastatin  40 mg daily.  No history of HTN or DM.  No prior history of stroke.  Evaluated by therapies and recommended home health OT and SLP and was discharged home in stable condition on 11/02/2019.  Stroke:  R thalamic infarct s/p IV tPA, secondary to small vessel disease Code Stroke CT Head - No acute intracranial abnormality.  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  MRI head - R thalamic infarct  2D Echo - EF 60-65%. No source of embolus  [employer] Virus 2 - negative LDL - 151 -initiate pravastatin  40 mg daily -history of statin intolerance to simvastatin and atorvastatin HgbA1c - 5.4 No antithrombotic prior to admission, now on aspirin  81 mg and plavix  75 mg daily x 3 weeks then aspirin  alone.  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   Left Upper Arm - Anterior (in situ)   Stroke (HCC) 10/2019    PSH:  Past Surgical History:  Procedure Laterality Date   CHOLECYSTECTOMY     INGUINAL HERNIA REPAIR Right 01/24/2017   Procedure: LAPAROSCOPIC RIGHT INGUINAL HERNIA WITH MESH;  Surgeon: Shela Derby, MD;  Location: [facility] OR;  Service: General;  Laterality: Right;   INSERTION OF MESH Right 01/24/2017   Procedure: INSERTION OF MESH;  Surgeon: Shela Derby, MD;  Location: [facility] OR;  Service: General;  Laterality: Right;    Social History:  Social History   Socioeconomic History   Marital status: Married    Spouse name: Benigno Brakeman   Number of children: Not on file   Years of education: Not on file   Highest education level: Doctorate  Occupational History    Comment: retired Adult nurse  Tobacco Use   Smoking status: Former   Smokeless tobacco: Never   Tobacco comments:    as teenager  Substance and Sexual Activity   Alcohol use: No   Drug use: No   Sexual activity: Not on file  Other Topics Concern   Not on file  Social History Narrative   Not on file   Social Drivers of Health   Financial Resource Strain: Low Risk  (04/28/2023)   Overall Financial Resource Strain (CARDIA)    Difficulty of Paying Living Expenses: Not hard at all  Food Insecurity: No Food Insecurity (04/28/2023)   Hunger Vital Sign    Worried About Running Out of Food in the Last Year: Never true    Ran Out of Food in the Last Year: Never true  Transportation Needs: No Transportation Needs (04/28/2023)   PRAPARE - Administrator, Civil Service (Medical): No    Lack of Transportation (Non-Medical): No   Physical Activity: Insufficiently Active (04/28/2023)   Exercise Vital Sign    Days of Exercise per Week: 6 days    Minutes of Exercise per Session: 20 min  Stress: No Stress Concern Present (04/28/2023)   [employer] of Occupational Health - Occupational Stress Questionnaire    Feeling of Stress : Not at all  Social Connections: Socially Integrated (04/28/2023)   Social Connection and Isolation Panel    Frequency of Communication with Friends and Family: More than three times a week    Frequency of Social Gatherings with Friends and Family: Once a week    Attends Religious Services: More than 4 times per year    Active Member of [employer] or Organizations: No    Attends Engineer, structural: More than 4 times per year    Marital Status: Married  Catering manager Violence: Not At Risk (04/28/2023)   Humiliation, Afraid, Rape, and Kick questionnaire    Fear of Current or Ex-Partner: No    Emotionally Abused: No    Physically Abused: No    Sexually Abused: No    Family History:  Family History  Problem Relation Age of Onset   Arthritis Mother    Heart disease Father    Arthritis Father    Diabetes Maternal Aunt    Cancer Paternal Uncle        lung   Stroke Maternal Grandmother    Diabetes Maternal Grandmother     Medications:   Current Outpatient Medications on File Prior to Visit  Medication Sig Dispense Refill   acetaminophen  (TYLENOL ) 650 MG CR tablet Take 650 mg by mouth daily as needed.     aspirin  EC 81 MG EC tablet Take 1 tablet (81 mg total) by mouth daily.     cyanocobalamin  (VITAMIN B12)  1000 MCG tablet Take 1,000 mcg by mouth 3 (three) times a week.     rosuvastatin  (CRESTOR ) 10 MG tablet TAKE 1 TABLET(10 MG) BY MOUTH DAILY 90 tablet 3   traMADol  (ULTRAM ) 50 MG tablet Take 1-2 tablets (50-100 mg total) by mouth every 8 (eight) hours as needed for severe pain (pain score 7-10). 40 tablet 0   No current facility-administered medications on file prior to visit.     Allergies:   Allergies  Allergen Reactions   Simvastatin Other (See Comments)    Muscle pain   Lipitor [Atorvastatin] Other (See Comments)    Muscle aches      OBJECTIVE:  Vitals  Vitals:   02/04/24 0843  BP: 123/78  Pulse: 92  Weight: 203 lb (92.1 kg)  Height: 5' 10 (1.778 m)   Body mass index is 29.13 kg/m. No results found.  Physical exam General: well developed, well nourished, very pleasant elderly Caucasian male, seated, in no evident distress  Neurologic Exam Mental Status: Awake and fully alert.  Fluent speech and language.  Disoriented to place and time. Recent memory impaired and remote memory appears intact. Attention span and concentration impaired with frequently speaking off topic or not answering questions appropriately and wife provides majority of history. Mood and affect appropriate. Cranial Nerves: Pupils equal, briskly reactive to light. Extraocular movements full without nystagmus. Visual fields full to confrontation.  HOH bilaterally with use of hearing aids. Facial sensation intact. Face, tongue, palate moves normally and symmetrically.  Motor: Normal bulk and tone. Normal strength in all tested extremity muscles.   Coordination: Rapid alternating movements normal in all extremities. Finger-to-nose and heel-to-shin performed accurately bilaterally. Gait and Station: Arises from chair with mild difficulty. Stance is slightly hunched.  Gait demonstrates decreased stride length and step height bilaterally, holds on to wife for support as RW not present.  Reflexes: 1+ and symmetric. Toes downgoing.      02/04/2024    8:44 AM 05/21/2022   11:54 AM  MMSE - Mini Mental State Exam  Orientation to time 1 2  Orientation to Place 3 4  Registration 3 3  Attention/ Calculation 1 2  Recall 0 0  Language- name 2 objects 2 2  Language- repeat 0 0  Language- follow 3 step command 3 3  Language- read & follow direction 1 1  Write a sentence 1 1  Copy  design 0 1  Copy design-comments  4 animals, failed clock drawing  Total score 15 19       ASSESSMENT: Payton Prinsen is a 83 y.o. year old male who is being seen for cognitive decline.  Prior history of right thalamic stroke in 2021 with resultant cognitive impairment.    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 Vitamin B12 deficiency -most recent B12 07/2023 358 (prior 168 08/2022) -encouraged continued supplement Discussed continued routine cognitive exercises as well as ensuring good sleep, healthy diet and management of vascular risk factors   Gait impairment: Frequent falls: Discussed importance of using rollator walker at all times for fall prevention Continue exercises as advised by recent home health PT Advised to call if he wishes to pursue additional therapies   Hx of Right thalamic stroke:  Continue aspirin  81  mg daily  and Crestor  for secondary stroke prevention managed/prescribed by PCP Discussed secondary stroke prevention measures and importance of close PCP follow-up for aggressive stroke risk factor management including BP goal<130/90HLD with LDL goal<70     Follow-up in 6 months or call earlier if needed     I personally spent a total of 45 minutes in the care of the patient today including preparing to see the patient, performing a medically appropriate exam/evaluation, counseling and educating, and documenting clinical information in the EHR.  Johny Nap, AGNP-BC  [facility] [address] [place], [place] [address]  Phone [phone] Fax [phone] Note: This document was prepared with digital  dictation and possible smart phrase technology. Any transcriptional errors that result from this process are unintentional.

## 2024-02-04 ENCOUNTER — Encounter: Payer: Self-pay | Admitting: Adult Health

## 2024-02-04 ENCOUNTER — Encounter: Payer: Self-pay | Admitting: Internal Medicine

## 2024-02-04 ENCOUNTER — Ambulatory Visit: Admitting: Adult Health

## 2024-02-04 VITALS — BP 123/78 | HR 92 | Ht 70.0 in | Wt 203.0 lb

## 2024-02-04 DIAGNOSIS — F01518 Vascular dementia, unspecified severity, with other behavioral disturbance: Secondary | ICD-10-CM

## 2024-02-04 DIAGNOSIS — G309 Alzheimer's disease, unspecified: Secondary | ICD-10-CM | POA: Diagnosis not present

## 2024-02-04 DIAGNOSIS — F02818 Dementia in other diseases classified elsewhere, unspecified severity, with other behavioral disturbance: Secondary | ICD-10-CM | POA: Diagnosis not present

## 2024-02-04 DIAGNOSIS — R2689 Other abnormalities of gait and mobility: Secondary | ICD-10-CM

## 2024-02-04 DIAGNOSIS — Z8673 Personal history of transient ischemic attack (TIA), and cerebral infarction without residual deficits: Secondary | ICD-10-CM | POA: Diagnosis not present

## 2024-02-04 NOTE — Patient Instructions (Addendum)
 Your Plan:  Consider starting Namenda which can help slow memory decline overtime - please let me know if you would like to start this   Ensure routine cognitive exercises as well as routine physical activity, ensuring good sleep, healthy diet and management of vascular risk factors  Continue B12 supplement as B12 deficiency can contribute to memory difficulties      Follow up in 6 months or call earlier if needed      Thank you for coming to see us  at [facility]. I hope we have been able to provide you high quality care today.  You may receive a patient satisfaction survey over the next few weeks. We would appreciate your feedback and comments so that we may continue to improve ourselves and the health of our patients.    Dementia Caregiver Guide Dementia is a condition that affects the way the brain works. It often affects thinking and memory. A person with dementia may: Forget things. Have trouble talking or responding to your questions. Have trouble paying attention. Have trouble thinking clearly and making good decisions. Get lost or wander away from home or other places. Have big changes in their mood or emotions. They may: Feel very worried, nervous, or depressed. Have angry outbursts. Be suspicious or accuse you of things. Have childlike behavior and language. Taking care of someone with dementia can be a challenge. The tips below can help you care for the person. How to help manage lifestyle changes Dementia usually gets worse slowly over time. In the early stages, people with dementia can stay safe and take care of themselves with some help. In later stages, they need help with daily tasks like getting dressed, grooming, and going to the bathroom. Communicating When the person is talking and seems frustrated, make eye contact and hold the person's hand. Ask questions that can be answered with a yes or no. Use simple words and a calm voice. Only  give one direction at a time. Limit choices for the person. Too many choices can be stressful. Avoid correcting the person in a negative way. If the person can't find the right words, gently try to help. Preventing injury  Keep floors clear. Remove rugs, magazine racks, and floor lamps. Keep hallways well lit, especially at night. Put a handrail and nonslip mat in the bathtub or shower. Put childproof locks on cabinets that have dangerous items in them. These items include medicine, alcohol, guns, cleaning products, and sharp tools. For doors to the outside, put locks where the person can't see or reach them. This helps keep the person from going out of the house and getting lost. Be ready for emergencies. Keep a list of emergency phone numbers and addresses close by. Remove car keys and lock garage doors so the person doesn't try to drive. Have the person wear a bracelet that tracks where they are and shows that they're a person with memory loss. This should be worn at all times for safety. Helping with daily life  Keep the person on track with their daily routine. Try to identify areas where the person may need help. Be supportive, patient, calm, and encouraging. Gently remind the person that adjusting to changes takes time. Help with the tasks that the person has asked for help with. Keep the person involved in daily tasks and decisions as much as you can. Encourage conversation, but try not to get frustrated if the person struggles to find words or doesn't seem to appreciate your help.  Other tips Think about any safety risks and take steps to avoid them. Keep things organized: Organize medicines in a pill box for each day of the week. Keep a calendar in a central place. Use it to remind the person of health care visits or other activities. Create a plan to handle any legal or financial matters. Get help from a professional if needed. Help make sure the person: Takes medicines only  as told by their health care providers. Eats regular, healthy meals. They should also drink plenty of fluids. Goes to all scheduled health care appointments. Gets regular sleep. Taking care of yourself Being a caregiver for someone with dementia can be hard. You may feel stressed and have many other emotions. It's important to also take care of yourself. Here are some tips: Find out about services that can provide short-term care for the person. This is called respite care. It can allow you to take a break when you need one. Find healthy ways to deal with stress. Some ways include: Spending time with other people. Exercising. Meditating or doing deep breathing exercises. Take care of your own health by: Getting enough sleep. Eating healthy foods. Getting regular exercise. Join a support group with others who are caregivers. These groups can help you: Learn other ways to deal with stress. Share experiences with others. Get emotional comfort and support. Learn about caregiving as the disease gets worse. Find resources in your community. Where to find support: Many people and organizations offer support. These include: Support groups for people with dementia. Support groups for caregivers. Counselors or therapists. Home health care services. Adult day care centers. Where to find more information Alzheimer's Association: WesternTunes.it Family Caregiver Alliance: caregiver.org Alzheimer's Foundation of [place]: alzfdn.org Contact a health care provider if: The person's health is quickly getting worse. You're no longer able to care for the person. Caring for the person is affecting your physical and emotional health. You're feeling worried, nervous, or depressed about caring for the person. Get help right away if: You feel like the person may hurt themselves or others. The person has talked about taking their own life. These symptoms may be an emergency. Take one of these steps right  away: Go to your nearest emergency room. Call 911. Call the National Suicide Prevention Lifeline at (380) 742-6703 or 988. Text the Crisis Text Line at (226)407-0388. This information is not intended to replace advice given to you by your health care provider. Make sure you discuss any questions you have with your health care provider. Document Revised: 11/15/2022 Document Reviewed: 11/15/2022 Elsevier Patient Education  2024 [employer].

## 2024-02-04 NOTE — Patient Instructions (Incomplete)
      Blood work was ordered.       Medications changes include :   namenda 5 mg daily for 1 week then twice a day     Return in about 6 months (around 08/06/2024) for follow up.

## 2024-02-04 NOTE — Progress Notes (Signed)
      Subjective:    Patient ID: Joe Robertson, male    DOB: 05/04/1941, 83 y.o.   MRN: [id number]     HPI Joe Robertson is here for follow up of his chronic medical problems.    Medications and allergies reviewed with patient and updated if appropriate.  Current Outpatient Medications on File Prior to Visit  Medication Sig Dispense Refill   acetaminophen  (TYLENOL ) 650 MG CR tablet Take 650 mg by mouth daily as needed.     aspirin  EC 81 MG EC tablet Take 1 tablet (81 mg total) by mouth daily.     cyanocobalamin  (VITAMIN B12) 1000 MCG tablet Take 1,000 mcg by mouth 3 (three) times a week.     rosuvastatin  (CRESTOR ) 10 MG tablet TAKE 1 TABLET(10 MG) BY MOUTH DAILY 90 tablet 3   traMADol  (ULTRAM ) 50 MG tablet Take 1-2 tablets (50-100 mg total) by mouth every 8 (eight) hours as needed for severe pain (pain score 7-10). 40 tablet 0   No current facility-administered medications on file prior to visit.     Review of Systems     Objective:  There were no vitals filed for this visit. BP Readings from Last 3 Encounters:  02/04/24 123/78  01/16/24 138/70  01/07/24 (!) 150/80   Wt Readings from Last 3 Encounters:  02/04/24 203 lb (92.1 kg)  01/16/24 201 lb (91.2 kg)  01/07/24 195 lb 1.7 oz (88.5 kg)   There is no height or weight on file to calculate BMI.    Physical Exam     Lab Results  Component Value Date   WBC 8.2 08/07/2023   HGB 16.0 08/07/2023   HCT 49.0 08/07/2023   PLT 225.0 08/07/2023   GLUCOSE 90 08/07/2023   CHOL 219 (H) 08/07/2023   TRIG 123.0 08/07/2023   HDL 34.90 (L) 08/07/2023   LDLDIRECT 147.0 02/02/2018   LDLCALC 160 (H) 08/07/2023   ALT 18 08/07/2023   AST 19 08/07/2023   NA 140 08/07/2023   K 4.3 08/07/2023   CL 104 08/07/2023   CREATININE 1.26 08/07/2023   BUN 15 08/07/2023   CO2 27 08/07/2023   TSH 2.02 08/07/2023   INR 1.0 10/30/2019   HGBA1C 5.7 08/07/2023     Assessment & Plan:    See Problem List for Assessment and Plan of  chronic medical problems.

## 2024-02-05 ENCOUNTER — Ambulatory Visit (INDEPENDENT_AMBULATORY_CARE_PROVIDER_SITE_OTHER): Payer: Medicare Other | Admitting: Internal Medicine

## 2024-02-05 ENCOUNTER — Ambulatory Visit: Payer: Medicare Other | Admitting: Internal Medicine

## 2024-02-05 VITALS — BP 120/70 | HR 90 | Temp 98.1°F | Ht 70.0 in | Wt 202.0 lb

## 2024-02-05 DIAGNOSIS — R7303 Prediabetes: Secondary | ICD-10-CM

## 2024-02-05 DIAGNOSIS — E782 Mixed hyperlipidemia: Secondary | ICD-10-CM

## 2024-02-05 DIAGNOSIS — E538 Deficiency of other specified B group vitamins: Secondary | ICD-10-CM | POA: Diagnosis not present

## 2024-02-05 DIAGNOSIS — G3184 Mild cognitive impairment, so stated: Secondary | ICD-10-CM

## 2024-02-05 DIAGNOSIS — I10 Essential (primary) hypertension: Secondary | ICD-10-CM | POA: Diagnosis not present

## 2024-02-05 DIAGNOSIS — E559 Vitamin D deficiency, unspecified: Secondary | ICD-10-CM

## 2024-02-05 DIAGNOSIS — Z8673 Personal history of transient ischemic attack (TIA), and cerebral infarction without residual deficits: Secondary | ICD-10-CM | POA: Diagnosis not present

## 2024-02-05 LAB — CBC WITH DIFFERENTIAL/PLATELET
Basophils Absolute: 0 10*3/uL (ref 0.0–0.1)
Basophils Relative: 0.5 % (ref 0.0–3.0)
Eosinophils Absolute: 0.3 10*3/uL (ref 0.0–0.7)
Eosinophils Relative: 3.6 % (ref 0.0–5.0)
HCT: 48.7 % (ref 39.0–52.0)
Hemoglobin: 15.8 g/dL (ref 13.0–17.0)
Lymphocytes Relative: 42.5 % (ref 12.0–46.0)
Lymphs Abs: 4 10*3/uL (ref 0.7–4.0)
MCHC: 32.4 g/dL (ref 30.0–36.0)
MCV: 92.9 fl (ref 78.0–100.0)
Monocytes Absolute: 1.2 10*3/uL — ABNORMAL HIGH (ref 0.1–1.0)
Monocytes Relative: 12.4 % — ABNORMAL HIGH (ref 3.0–12.0)
Neutro Abs: 3.9 10*3/uL (ref 1.4–7.7)
Neutrophils Relative %: 41 % — ABNORMAL LOW (ref 43.0–77.0)
Platelets: 216 10*3/uL (ref 150.0–400.0)
RBC: 5.25 Mil/uL (ref 4.22–5.81)
RDW: 13.8 % (ref 11.5–15.5)
WBC: 9.5 10*3/uL (ref 4.0–10.5)

## 2024-02-05 LAB — COMPREHENSIVE METABOLIC PANEL WITH GFR
ALT: 27 U/L (ref 0–53)
AST: 21 U/L (ref 0–37)
Albumin: 4.3 g/dL (ref 3.5–5.2)
Alkaline Phosphatase: 59 U/L (ref 39–117)
BUN: 14 mg/dL (ref 6–23)
CO2: 29 meq/L (ref 19–32)
Calcium: 9.3 mg/dL (ref 8.4–10.5)
Chloride: 107 meq/L (ref 96–112)
Creatinine, Ser: 1.17 mg/dL (ref 0.40–1.50)
GFR: 58.39 mL/min — ABNORMAL LOW (ref >60.00)
Glucose, Bld: 79 mg/dL (ref 70–99)
Potassium: 4.1 meq/L (ref 3.5–5.1)
Sodium: 143 meq/L (ref 135–145)
Total Bilirubin: 0.7 mg/dL (ref 0.2–1.2)
Total Protein: 7.3 g/dL (ref 6.0–8.3)

## 2024-02-05 LAB — VITAMIN B12: Vitamin B-12: 450 pg/mL (ref 211–911)

## 2024-02-05 LAB — LIPID PANEL
Cholesterol: 140 mg/dL (ref 0–200)
HDL: 48.8 mg/dL (ref >39.00)
LDL Cholesterol: 61 mg/dL (ref 0–99)
NonHDL: 90.99
Total CHOL/HDL Ratio: 3
Triglycerides: 148 mg/dL (ref 0.0–149.0)
VLDL: 29.6 mg/dL (ref 0.0–40.0)

## 2024-02-05 LAB — HEMOGLOBIN A1C: Hgb A1c MFr Bld: 5.8 % (ref 4.6–6.5)

## 2024-02-05 LAB — VITAMIN D 25 HYDROXY (VIT D DEFICIENCY, FRACTURES): VITD: 20.69 ng/mL — ABNORMAL LOW (ref 30.00–100.00)

## 2024-02-05 MED ORDER — MEMANTINE HCL 5 MG PO TABS: ORAL_TABLET | ORAL | 5 refills | Status: DC

## 2024-02-05 NOTE — Assessment & Plan Note (Signed)
 Chronic History of stroke Lab Results  Component Value Date   LDLCALC 160 (H) 08/07/2023   Continue rosuvastatin  10 mg daily- stressed compliance

## 2024-02-05 NOTE — Assessment & Plan Note (Signed)
 Chronic Lab Results  Component Value Date   HGBA1C 5.7 08/07/2023   Check a1c Low sugar / carb diet Stressed regular exercise

## 2024-02-05 NOTE — Assessment & Plan Note (Signed)
 Chronic Stressed importance of taking B12 daily Check B12 level

## 2024-02-05 NOTE — Assessment & Plan Note (Signed)
 Chronic Blood pressure controlled CMP, cbc Not currently on any medication

## 2024-02-05 NOTE — Assessment & Plan Note (Addendum)
 Chronic Memory has worsened - he is dependent on his wife Saw neurology yesterday MMSE yesterday 15/30, previously 19/30 in 05/2022  Encouraged regular exercise Deferred medication yesterday but agrees to try medication today Start namenda  5 mg daily x 1 week then 5 mg bid

## 2024-02-05 NOTE — Assessment & Plan Note (Signed)
 History of stroke-residual dizziness Lab Results  Component Value Date   LDLCALC 160 (H) 08/07/2023   Stressed importance of taking Crestor  daily LDL not at goal Continue aspirin  81 mg daily Blood pressure controlled Encouraged healthy diet, regular exercise

## 2024-02-05 NOTE — Assessment & Plan Note (Signed)
 Chronic Not taking vitamin D  daily Check vitamin d  level

## 2024-02-07 ENCOUNTER — Ambulatory Visit: Payer: Self-pay | Admitting: Internal Medicine

## 2024-02-12 DIAGNOSIS — Z6828 Body mass index (BMI) 28.0-28.9, adult: Secondary | ICD-10-CM | POA: Diagnosis not present

## 2024-02-12 DIAGNOSIS — S12031A Nondisplaced posterior arch fracture of first cervical vertebra, initial encounter for closed fracture: Secondary | ICD-10-CM | POA: Diagnosis not present

## 2024-02-17 DIAGNOSIS — H16223 Keratoconjunctivitis sicca, not specified as Sjogren's, bilateral: Secondary | ICD-10-CM | POA: Diagnosis not present

## 2024-03-11 DIAGNOSIS — S12031A Nondisplaced posterior arch fracture of first cervical vertebra, initial encounter for closed fracture: Secondary | ICD-10-CM | POA: Diagnosis not present

## 2024-03-11 DIAGNOSIS — Z6828 Body mass index (BMI) 28.0-28.9, adult: Secondary | ICD-10-CM | POA: Diagnosis not present

## 2024-03-12 ENCOUNTER — Encounter: Payer: Self-pay | Admitting: Adult Health

## 2024-03-12 ENCOUNTER — Other Ambulatory Visit: Payer: Self-pay | Admitting: Student

## 2024-03-12 DIAGNOSIS — S12031A Nondisplaced posterior arch fracture of first cervical vertebra, initial encounter for closed fracture: Secondary | ICD-10-CM

## 2024-03-16 ENCOUNTER — Ambulatory Visit
Admission: RE | Admit: 2024-03-16 | Discharge: 2024-03-16 | Disposition: A | Discharge status: Home / Self Care | Source: Ambulatory Visit | Attending: Student | Admitting: Student

## 2024-03-16 DIAGNOSIS — M47812 Spondylosis without myelopathy or radiculopathy, cervical region: Secondary | ICD-10-CM | POA: Diagnosis not present

## 2024-03-16 DIAGNOSIS — S12031A Nondisplaced posterior arch fracture of first cervical vertebra, initial encounter for closed fracture: Secondary | ICD-10-CM | POA: Diagnosis not present

## 2024-03-16 DIAGNOSIS — M4312 Spondylolisthesis, cervical region: Secondary | ICD-10-CM | POA: Diagnosis not present

## 2024-04-08 DIAGNOSIS — S12031A Nondisplaced posterior arch fracture of first cervical vertebra, initial encounter for closed fracture: Secondary | ICD-10-CM | POA: Diagnosis not present

## 2024-04-08 DIAGNOSIS — Z6828 Body mass index (BMI) 28.0-28.9, adult: Secondary | ICD-10-CM | POA: Diagnosis not present

## 2024-04-28 ENCOUNTER — Ambulatory Visit (INDEPENDENT_AMBULATORY_CARE_PROVIDER_SITE_OTHER): Payer: Medicare Other

## 2024-04-28 VITALS — Ht 70.0 in | Wt 202.0 lb

## 2024-04-28 DIAGNOSIS — Z0000 Encounter for general adult medical examination without abnormal findings: Secondary | ICD-10-CM | POA: Diagnosis not present

## 2024-04-28 NOTE — Progress Notes (Signed)
 Subjective:   Joe Robertson is a 83 y.o. who presents for a Medicare Wellness preventive visit.  As a reminder, Annual Wellness Visits don't include a physical exam, and some assessments may be limited, especially if this visit is performed virtually. We may recommend an in-person follow-up visit with your provider if needed.  Visit Complete: Virtual I connected with  Joe Robertson on 04/28/24 by a audio enabled telemedicine application and verified that I am speaking with the correct person using two identifiers.  Patient Location: Home  Provider Location: Office/Clinic  I discussed the limitations of evaluation and management by telemedicine. The patient expressed understanding and agreed to proceed.  Vital Signs: Because this visit was a virtual/telehealth visit, some criteria may be missing or patient reported. Any vitals not documented were not able to be obtained and vitals that have been documented are patient reported.  VideoDeclined- This patient declined Librarian, academic. Therefore the visit was completed with audio only.  Persons Participating in Visit: Patient assisted by Spouse, Erminio Robertson.  AWV Questionnaire: No: Patient Medicare AWV questionnaire was not completed prior to this visit.  Cardiac Risk Factors include: advanced age (>79men, >11 women);dyslipidemia;male gender;Other (see comment), Risk factor comments: h/o stroke     Objective:    Today's Vitals   04/28/24 1353  Weight: 202 lb (91.6 kg)  Height: 5' 10 (1.778 m)   Body mass index is 28.98 kg/m.     04/28/2024    1:53 PM 01/07/2024    7:25 PM 08/11/2023    9:42 AM 04/28/2023    2:06 PM 02/03/2023   12:36 PM 04/24/2022    8:50 AM 03/05/2022    3:35 PM  Advanced Directives  Does Patient Have a Medical Advance Directive? No Yes Yes Yes No Yes Yes  Type of Special educational needs teacher of [place];Living will  Healthcare Power of [place];Living will   Healthcare Power of [place];Living will Healthcare Power of [place];Living will  Does patient want to make changes to medical advance directive?       No - Patient declined  Copy of Healthcare Power of Attorney in Chart?    No - copy requested  No - copy requested   Would patient like information on creating a medical advance directive? No - Patient declined    No - Patient declined      Current Medications (verified) Outpatient Encounter Medications as of 04/28/2024  Medication Sig   acetaminophen  (TYLENOL ) 650 MG CR tablet Take 650 mg by mouth daily as needed.   aspirin  EC 81 MG EC tablet Take 1 tablet (81 mg total) by mouth daily.   cyanocobalamin  (VITAMIN B12) 1000 MCG tablet Take 1,000 mcg by mouth 3 (three) times a week.   memantine  (NAMENDA ) 5 MG tablet Take 5 mg daily for one week then increase to 5 mg bid   rosuvastatin  (CRESTOR ) 10 MG tablet TAKE 1 TABLET(10 MG) BY MOUTH DAILY   traMADol  (ULTRAM ) 50 MG tablet Take 1-2 tablets (50-100 mg total) by mouth every 8 (eight) hours as needed for severe pain (pain score 7-10).   No facility-administered encounter medications on file as of 04/28/2024.    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   Left Upper Arm - Anterior (in situ)   Stroke (HCC) 10/2019   Past Surgical History:  Procedure Laterality Date   CHOLECYSTECTOMY     INGUINAL HERNIA REPAIR Right 01/24/2017   Procedure: LAPAROSCOPIC RIGHT INGUINAL HERNIA WITH MESH;  Surgeon: Rubin Calamity, MD;  Location: [facility] OR;  Service:  General;  Laterality: Right;   INSERTION OF MESH Right 01/24/2017   Procedure: INSERTION OF MESH;  Surgeon: Rubin Calamity, MD;  Location: [facility] OR;  Service: General;  Laterality: Right;   Family History  Problem Relation Age of Onset   Arthritis Mother    Heart disease Father    Arthritis Father    Diabetes Maternal Aunt    Cancer Paternal Uncle        lung   Stroke Maternal Grandmother    Diabetes Maternal Grandmother    Social History   Socioeconomic History   Marital status: Married    Spouse name: Joe Robertson   Number of children: Not on file   Years of education: Not on file   Highest education level: Doctorate  Occupational History    Comment: retired Adult nurse  Tobacco Use   Smoking status: Former   Smokeless tobacco: Never   Tobacco comments:    as teenager  Substance and Sexual Activity   Alcohol use: No   Drug use: No   Sexual activity: Not Currently  Other Topics Concern   Not on file  Social History Narrative   Married   Social Drivers of Health   Financial Resource Strain: Low Risk  (04/28/2024)   Overall Financial Resource Strain (CARDIA)    Difficulty of Paying Living Expenses: Not hard at all  Food Insecurity: No Food Insecurity (04/28/2024)   Hunger Vital Sign    Worried About Running Out of Food in the Last Year: Never true    Ran Out of Food in the Last Year: Never true  Transportation Needs: No Transportation Needs (04/28/2024)   PRAPARE - Administrator, Civil Service (Medical): No    Lack of Transportation (Non-Medical): No  Physical Activity: Insufficiently Active (04/28/2024)   Exercise Vital Sign    Days of Exercise per Week: 7 days    Minutes of Exercise per Session: 20 min  Stress: No Stress Concern Present (04/28/2024)   [employer] of Occupational Health - Occupational Stress Questionnaire    Feeling of Stress: Not at all  Social Connections: Socially Integrated (04/28/2024)   Social Connection and Isolation Panel     Frequency of Communication with Friends and Family: More than three times a week    Frequency of Social Gatherings with Friends and Family: Once a week    Attends Religious Services: More than 4 times per year    Active Member of [employer] or Organizations: No    Attends Engineer, structural: More than 4 times per year    Marital Status: Married    Tobacco Counseling Counseling given: Not Answered Tobacco comments: as teenager    Clinical Intake:  Pre-visit preparation completed: Yes  Pain : No/denies pain     BMI - recorded: 28.98 Nutritional Status: BMI 25 -29 Overweight Nutritional Risks: None Diabetes: No  Lab Results  Component Value Date   HGBA1C 5.8 02/05/2024   HGBA1C 5.7 08/07/2023   HGBA1C 5.5 02/07/2023  How often do you need to have someone help you when you read instructions, pamphlets, or other written materials from your doctor or pharmacy?: 5 - Always (Spouse helps)  Interpreter Needed?: No  Information entered by :: Verdie Saba, CMA   Activities of Daily Living     04/28/2024    1:56 PM  In your present state of health, do you have any difficulty performing the following activities:  Hearing? 0  Comment weras hearing aids  Vision? 0  Difficulty concentrating or making decisions? 0  Walking or climbing stairs? 0  Dressing or bathing? 0  Doing errands, shopping? 0  Preparing Food and eating ? N  Using the Toilet? N  In the past six months, have you accidently leaked urine? N  Do you have problems with loss of bowel control? N  Managing your Medications? Y  Comment Spouse helps  Managing your Finances? Y  Comment Spouse helps  Housekeeping or managing your Housekeeping? Y  Comment Spouse helps    Patient Care Team: Geofm Glade PARAS, MD as PCP - General (Internal Medicine) Alvan Ronal BRAVO, MD (Inactive) as PCP - Cardiology (Cardiology) Regenia Prentice Clack, MD as Consulting Physician (Ophthalmology) Livingston Rigg, MD as  Consulting Physician (Dermatology)  I have updated your Care Teams any recent Medical Services you may have received from other providers in the past year.     Assessment:   This is a routine wellness examination for Joe Robertson.  Hearing/Vision screen Hearing Screening - Comments:: Wears hearing aids Vision Screening - Comments:: Wears  eyeglasses for reading - up to date with routine eye exams with [facility]   Goals Addressed               This Visit's Progress     Patient Stated (pt-stated)        Patient stated he plans to continue taking medications daily        Depression Screen     04/28/2024    2:03 PM 02/05/2024   10:21 AM 04/28/2023    2:09 PM 02/07/2023    8:01 AM 08/27/2022    9:15 AM 04/24/2022    8:56 AM 03/04/2022    8:57 AM  PHQ 2/9 Scores  PHQ - 2 Score 0 0 0 0 0 0 0  PHQ- 9 Score 0 0 0  0 0 0    Fall Risk     04/28/2024    1:58 PM 02/05/2024   10:21 AM 04/28/2023    2:08 PM 02/07/2023    7:58 AM 08/27/2022   10:06 AM  Fall Risk   Falls in the past year? 1 1 1 1 1   Number falls in past yr: 1 1 0 0 1  Comment 2      Injury with Fall? 1 1 0 1 1  Comment fracture   broken last 2 fingers on left had, head did hit the ground   Risk for fall due to : Impaired balance/gait;History of fall(s) History of fall(s)  Other (Comment) History of fall(s)  Follow up Falls evaluation completed;Falls prevention discussed Falls evaluation completed Falls evaluation completed;Education provided Falls evaluation completed Falls prevention discussed      Data saved with a previous flowsheet row definition    MEDICARE RISK AT HOME:  Medicare Risk at Home Any stairs in or around the home?: No If so, are there any without handrails?: No Home free of loose throw rugs in walkways, pet beds, electrical cords, etc?: Yes Adequate lighting  in your home to reduce risk of falls?: Yes Life alert?: No Use of a cane, walker or w/c?: No Grab bars in the bathroom?: Yes Shower chair or  bench in shower?: Yes Elevated toilet seat or a handicapped toilet?: Yes  TIMED UP AND GO:  Was the test performed?  No  Cognitive Function: 6CIT completed    02/04/2024    8:44 AM 05/21/2022   11:54 AM  MMSE - Mini Mental State Exam  Orientation to time 1 2  Orientation to Place 3 4  Registration 3 3  Attention/ Calculation 1 2  Recall 0 0  Language- name 2 objects 2 2  Language- repeat 0 0  Language- follow 3 step command 3 3  Language- read & follow direction 1 1  Write a sentence 1 1  Copy design 0 1  Copy design-comments  4 animals, failed clock drawing  Total score 15 19        04/28/2024    2:01 PM 04/28/2023    2:09 PM 04/24/2022    9:02 AM  6CIT Screen  What Year? 0 points 0 points 0 points  What month? 0 points 0 points 0 points  What time? 0 points 0 points 0 points  Count back from 20 0 points 0 points 0 points  Months in reverse 0 points 0 points 0 points  Repeat phrase 10 points 0 points 0 points  Total Score 10 points 0 points 0 points    Immunizations Immunization History  Administered Date(s) Administered   Fluad Quad(high Dose 65+) 04/12/2019, 06/01/2020, 05/14/2021, 05/20/2022   Fluad Trivalent(High Dose 65+) 05/02/2023   INFLUENZA, HIGH DOSE SEASONAL PF 05/21/2015, 05/20/2017, 05/18/2018   Influenza Split 06/13/2014   Influenza-Unspecified 06/02/2016   Moderna Sars-Covid-2 Vaccination 10/15/2019, 11/12/2019, 08/24/2020   Pneumococcal Conjugate-13 05/20/2015   Pneumococcal Polysaccharide-23 07/03/2016   Tdap 05/18/2018, 02/03/2023    Screening Tests Health Maintenance  Topic Date Due   Zoster Vaccines- Shingrix (1 of 2) Never done   Influenza Vaccine  03/19/2024   COVID-19 Vaccine (4 - 2025-26 season) 04/19/2024   Medicare Annual Wellness (AWV)  04/28/2025   DTaP/Tdap/Td (3 - Td or Tdap) 02/02/2033   Pneumococcal Vaccine: 50+ Years  Completed   HPV VACCINES  Aged Out   Meningococcal B Vaccine  Aged Out    Health Maintenance Items  Addressed: 04/28/2024  Additional Screening:  Vision Screening: Recommended annual ophthalmology exams for early detection of glaucoma and other disorders of the eye. Is the patient up to date with their annual eye exam?  Yes  Who is the provider or what is the name of the office in which the patient attends annual eye exams? [facility]  Dental Screening: Recommended annual dental exams for proper oral hygiene  Community Resource Referral / Chronic Care Management: CRR required this visit?  No   CCM required this visit?  No   Plan:    I have personally reviewed and noted the following in the patient's chart:   Medical and social history Use of alcohol, tobacco or illicit drugs  Current medications and supplements including opioid prescriptions. Patient is not currently taking opioid prescriptions. Functional ability and status Nutritional status Physical activity Advanced directives List of other physicians Hospitalizations, surgeries, and ER visits in previous 12 months Vitals Screenings to include cognitive, depression, and falls Referrals and appointments  In addition, I have reviewed and discussed with patient certain preventive protocols, quality metrics, and best practice recommendations. A written personalized care plan  for preventive services as well as general preventive health recommendations were provided to patient.   Verdie CHRISTELLA Saba, CMA   04/28/2024   After Visit Summary: (MyChart) Due to this being a telephonic visit, the after visit summary with patients personalized plan was offered to patient via MyChart   Notes: Nothing significant to report at this time.

## 2024-04-28 NOTE — Progress Notes (Signed)
 Subjective:   Joe Robertson is a 83 y.o. who presents for a Medicare Wellness preventive visit.  As a reminder, Annual Wellness Visits don't include a physical exam, and some assessments may be limited, especially if this visit is performed virtually. We may recommend an in-person follow-up visit with your provider if needed.  Visit Complete: Virtual I connected with  Joe Robertson on 04/28/24 by a audio enabled telemedicine application and verified that I am speaking with the correct person using two identifiers.  Patient Location: Home  Provider Location: Office/Clinic  I discussed the limitations of evaluation and management by telemedicine. The patient expressed understanding and agreed to proceed.  Vital Signs: Because this visit was a virtual/telehealth visit, some criteria may be missing or patient reported. Any vitals not documented were not able to be obtained and vitals that have been documented are patient reported.  VideoDeclined- This patient declined Librarian, academic. Therefore the visit was completed with audio only.  Persons Participating in Visit: Patient assisted by Spouse, Erminio Robertson.  AWV Questionnaire: No: Patient Medicare AWV questionnaire was not completed prior to this visit.  Cardiac Risk Factors include: advanced age (>71men, >42 women);dyslipidemia;male gender;Other (see comment), Risk factor comments: h/o stroke     Objective:    Today's Vitals   04/28/24 1353  Weight: 202 lb (91.6 kg)  Height: 5' 10 (1.778 m)   Body mass index is 28.98 kg/m.     04/28/2024    1:53 PM 01/07/2024    7:25 PM 08/11/2023    9:42 AM 04/28/2023    2:06 PM 02/03/2023   12:36 PM 04/24/2022    8:50 AM 03/05/2022    3:35 PM  Advanced Directives  Does Patient Have a Medical Advance Directive? No Yes Yes Yes No Yes Yes  Type of Special educational needs teacher of [place];Living will  Healthcare Power of [place];Living will   Healthcare Power of [place];Living will Healthcare Power of [place];Living will  Does patient want to make changes to medical advance directive?       No - Patient declined  Copy of Healthcare Power of Attorney in Chart?    No - copy requested  No - copy requested   Would patient like information on creating a medical advance directive? No - Patient declined    No - Patient declined      Current Medications (verified) Outpatient Encounter Medications as of 04/28/2024  Medication Sig   acetaminophen  (TYLENOL ) 650 MG CR tablet Take 650 mg by mouth daily as needed.   aspirin  EC 81 MG EC tablet Take 1 tablet (81 mg total) by mouth daily.   cyanocobalamin  (VITAMIN B12) 1000 MCG tablet Take 1,000 mcg by mouth 3 (three) times a week.   memantine  (NAMENDA ) 5 MG tablet Take 5 mg daily for one week then increase to 5 mg bid   rosuvastatin  (CRESTOR ) 10 MG tablet TAKE 1 TABLET(10 MG) BY MOUTH DAILY   traMADol  (ULTRAM ) 50 MG tablet Take 1-2 tablets (50-100 mg total) by mouth every 8 (eight) hours as needed for severe pain (pain score 7-10).   No facility-administered encounter medications on file as of 04/28/2024.    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 OR;  Service: General;  Laterality: Right;   Family History  Problem Relation Age of Onset   Arthritis Mother    Heart disease Father    Arthritis Father    Diabetes Maternal Aunt    Cancer Paternal Uncle        lung   Stroke Maternal Grandmother    Diabetes Maternal Grandmother    Social History   Socioeconomic History   Marital status: Married    Spouse name: Cy   Number of children: Not on file   Years of education: Not on file   Highest education level: Doctorate  Occupational History    Comment: retired Adult nurse  Tobacco Use   Smoking status: Former   Smokeless tobacco: Never   Tobacco comments:    as teenager  Substance and Sexual Activity   Alcohol use: No   Drug use: No   Sexual activity: Not Currently  Other Topics Concern   Not on file  Social History Narrative   Married   Social Drivers of Health   Financial Resource Strain: Low Risk  (04/28/2024)   Overall Financial Resource Strain (CARDIA)    Difficulty of Paying Living Expenses: Not hard at all  Food Insecurity: No Food Insecurity (04/28/2024)   Hunger Vital Sign    Worried About Running Out of Food in the Last Year: Never true    Ran Out of Food in the Last Year: Never true  Transportation Needs: No Transportation Needs (04/28/2024)   PRAPARE - Administrator, Civil Service (Medical): No    Lack of Transportation (Non-Medical): No  Physical Activity: Insufficiently Active (04/28/2024)   Exercise Vital Sign    Days of Exercise per Week: 7 days    Minutes of Exercise per Session: 20 min  Stress: No Stress Concern Present (04/28/2024)   [employer] of Occupational Health - Occupational Stress Questionnaire    Feeling of Stress: Not at all  Social Connections: Socially Integrated (04/28/2024)   Social Connection and Isolation Panel     Frequency of Communication with Friends and Family: More than three times a week    Frequency of Social Gatherings with Friends and Family: Once a week    Attends Religious Services: More than 4 times per year    Active Member of [employer] or Organizations: No    Attends Engineer, structural: More than 4 times per year    Marital Status: Married    Tobacco Counseling Counseling given: Not Answered Tobacco comments: as teenager    Clinical Intake:  Pre-visit preparation completed: Yes  Pain : No/denies pain     BMI - recorded: 28.98 Nutritional Status: BMI 25 -29 Overweight Nutritional Risks: None Diabetes: No  Lab Results  Component Value Date   HGBA1C 5.8 02/05/2024   HGBA1C 5.7 08/07/2023   HGBA1C 5.5 02/07/2023  How often do you need to have someone help you when you read instructions, pamphlets, or other written materials from your doctor or pharmacy?: 5 - Always (Spouse helps)  Interpreter Needed?: No  Information entered by :: Verdie Saba, CMA   Activities of Daily Living     04/28/2024    1:56 PM  In your present state of health, do you have any difficulty performing the following activities:  Hearing? 0  Comment weras hearing aids  Vision? 0  Difficulty concentrating or making decisions? 0  Walking or climbing stairs? 0  Dressing or bathing? 0  Doing errands, shopping? 1  Comment Spouse handles  Preparing Food and eating ? N  Using the Toilet? N  In the past six months, have you accidently leaked urine? N  Do you have problems with loss of bowel control? N  Managing your Medications? Y  Comment Spouse helps  Managing your Finances? Y  Comment Spouse helps  Housekeeping or managing your Housekeeping? Y  Comment Spouse helps    Patient Care Team: Geofm Glade PARAS, MD as PCP - General (Internal Medicine) Alvan Ronal BRAVO, MD (Inactive) as PCP - Cardiology (Cardiology) Regenia Prentice Clack, MD as Consulting Physician  (Ophthalmology) Livingston Rigg, MD as Consulting Physician (Dermatology)  I have updated your Care Teams any recent Medical Services you may have received from other providers in the past year.     Assessment:   This is a routine wellness examination for [place].  Hearing/Vision screen Hearing Screening - Comments:: Wears hearing aids Vision Screening - Comments:: Wears  eyeglasses for reading - up to date with routine eye exams with [facility]   Goals Addressed               This Visit's Progress     Patient Stated (pt-stated)        Patient stated he plans to continue taking medications daily        Depression Screen     04/28/2024    2:03 PM 02/05/2024   10:21 AM 04/28/2023    2:09 PM 02/07/2023    8:01 AM 08/27/2022    9:15 AM 04/24/2022    8:56 AM 03/04/2022    8:57 AM  PHQ 2/9 Scores  PHQ - 2 Score 0 0 0 0 0 0 0  PHQ- 9 Score 0 0 0  0 0 0    Fall Risk     04/28/2024    1:58 PM 02/05/2024   10:21 AM 04/28/2023    2:08 PM 02/07/2023    7:58 AM 08/27/2022   10:06 AM  Fall Risk   Falls in the past year? 1 1 1 1 1   Number falls in past yr: 1 1 0 0 1  Comment 2      Injury with Fall? 1 1 0 1 1  Comment fracture   broken last 2 fingers on left had, head did hit the ground   Risk for fall due to : Impaired balance/gait;History of fall(s) History of fall(s)  Other (Comment) History of fall(s)  Follow up Falls evaluation completed;Falls prevention discussed Falls evaluation completed Falls evaluation completed;Education provided Falls evaluation completed Falls prevention discussed      Data saved with a previous flowsheet row definition    MEDICARE RISK AT HOME:  Medicare Risk at Home Any stairs in or around the home?: No If so, are there any without handrails?: No Home free of loose throw rugs in walkways, pet beds, electrical cords,  etc?: Yes Adequate lighting in your home to reduce risk of falls?: Yes Life alert?: No Use of a cane, walker or w/c?: No Grab bars in  the bathroom?: Yes Shower chair or bench in shower?: Yes Elevated toilet seat or a handicapped toilet?: Yes  TIMED UP AND GO:  Was the test performed?  No  Cognitive Function: 6CIT completed    02/04/2024    8:44 AM 05/21/2022   11:54 AM  MMSE - Mini Mental State Exam  Orientation to time 1 2  Orientation to Place 3 4  Registration 3 3  Attention/ Calculation 1 2  Recall 0 0  Language- name 2 objects 2 2  Language- repeat 0 0  Language- follow 3 step command 3 3  Language- read & follow direction 1 1  Write a sentence 1 1  Copy design 0 1  Copy design-comments  4 animals, failed clock drawing  Total score 15 19        04/28/2024    2:01 PM 04/28/2023    2:09 PM 04/24/2022    9:02 AM  6CIT Screen  What Year? 0 points 0 points 0 points  What month? 0 points 0 points 0 points  What time? 0 points 0 points 0 points  Count back from 20 0 points 0 points 0 points  Months in reverse 0 points 0 points 0 points  Repeat phrase 10 points 0 points 0 points  Total Score 10 points 0 points 0 points    Immunizations Immunization History  Administered Date(s) Administered   Fluad Quad(high Dose 65+) 04/12/2019, 06/01/2020, 05/14/2021, 05/20/2022   Fluad Trivalent(High Dose 65+) 05/02/2023   INFLUENZA, HIGH DOSE SEASONAL PF 05/21/2015, 05/20/2017, 05/18/2018   Influenza Split 06/13/2014   Influenza-Unspecified 06/02/2016   Moderna Sars-Covid-2 Vaccination 10/15/2019, 11/12/2019, 08/24/2020   Pneumococcal Conjugate-13 05/20/2015   Pneumococcal Polysaccharide-23 07/03/2016   Tdap 05/18/2018, 02/03/2023    Screening Tests Health Maintenance  Topic Date Due   Zoster Vaccines- Shingrix (1 of 2) Never done   Influenza Vaccine  03/19/2024   COVID-19 Vaccine (4 - 2025-26 season) 04/19/2024   Medicare Annual Wellness (AWV)  04/28/2025   DTaP/Tdap/Td (3 - Td or Tdap) 02/02/2033   Pneumococcal Vaccine: 50+ Years  Completed   HPV VACCINES  Aged Out   Meningococcal B Vaccine  Aged Out     Health Maintenance Items Addressed: 04/28/2024  Additional Screening:  Vision Screening: Recommended annual ophthalmology exams for early detection of glaucoma and other disorders of the eye. Is the patient up to date with their annual eye exam?  Yes  Who is the provider or what is the name of the office in which the patient attends annual eye exams? [facility]  Dental Screening: Recommended annual dental exams for proper oral hygiene  Community Resource Referral / Chronic Care Management: CRR required this visit?  No   CCM required this visit?  No   Plan:    I have personally reviewed and noted the following in the patient's chart:   Medical and social history Use of alcohol, tobacco or illicit drugs  Current medications and supplements including opioid prescriptions. Patient is not currently taking opioid prescriptions. Functional ability and status Nutritional status Physical activity Advanced directives List of other physicians Hospitalizations, surgeries, and ER visits in previous 12 months Vitals Screenings to include cognitive, depression, and falls Referrals and appointments  In addition, I have reviewed and discussed with patient certain preventive protocols, quality metrics, and best practice recommendations. A  written personalized care plan for preventive services as well as general preventive health recommendations were provided to patient.   Verdie CHRISTELLA Saba, CMA   04/28/2024   After Visit Summary: (MyChart) Due to this being a telephonic visit, the after visit summary with patients personalized plan was offered to patient via MyChart   Notes: Nothing significant to report at this time.

## 2024-04-28 NOTE — Patient Instructions (Addendum)
 Joe Robertson,  Thank you for taking the time for your Medicare Wellness Visit. I appreciate your continued commitment to your health goals. Please review the care plan we discussed, and feel free to reach out if I can assist you further.  Medicare recommends these wellness visits once per year to help you and your care team stay ahead of potential health issues. These visits are designed to focus on prevention, allowing your provider to concentrate on managing your acute and chronic conditions during your regular appointments.  Please note that Annual Wellness Visits do not include a physical exam. Some assessments may be limited, especially if the visit was conducted virtually. If needed, we may recommend a separate in-person follow-up with your provider.  Ongoing Care Seeing your primary care provider every 3 to 6 months helps us  monitor your health and provide consistent, personalized care.   Referrals If a referral was made during today's visit and you haven't received any updates within two weeks, please contact the referred provider directly to check on the status.  Recommended Screenings:  Health Maintenance  Topic Date Due   Zoster (Shingles) Vaccine (1 of 2) Never done   Flu Shot  03/19/2024   COVID-19 Vaccine (4 - 2025-26 season) 04/19/2024   Medicare Annual Wellness Visit  04/28/2025   DTaP/Tdap/Td vaccine (3 - Td or Tdap) 02/02/2033   Pneumococcal Vaccine for age over 54  Completed   HPV Vaccine  Aged Out   Meningitis B Vaccine  Aged Out       04/28/2024    1:53 PM  Advanced Directives  Does Patient Have a Medical Advance Directive? No  Would patient like information on creating a medical advance directive? No - Patient declined   Advance Care Planning is important because it: Ensures you receive medical care that aligns with your values, goals, and preferences. Provides guidance to your family and loved ones, reducing the emotional burden of decision-making during  critical moments.  Vision: Annual vision screenings are recommended for early detection of glaucoma, cataracts, and diabetic retinopathy. These exams can also reveal signs of chronic conditions such as diabetes and high blood pressure.  Dental: Annual dental screenings help detect early signs of oral cancer, gum disease, and other conditions linked to overall health, including heart disease and diabetes.

## 2024-05-17 ENCOUNTER — Other Ambulatory Visit (HOSPITAL_BASED_OUTPATIENT_CLINIC_OR_DEPARTMENT_OTHER): Payer: Self-pay

## 2024-05-24 ENCOUNTER — Ambulatory Visit

## 2024-05-24 DIAGNOSIS — Z23 Encounter for immunization: Secondary | ICD-10-CM

## 2024-05-24 NOTE — Progress Notes (Signed)
 Patient is in office today with spouse for an office visit. Given flu vaccine. Vaccine documented. Pt tolerated well.

## 2024-08-05 ENCOUNTER — Encounter: Payer: Self-pay | Admitting: Internal Medicine

## 2024-08-05 DIAGNOSIS — G309 Alzheimer's disease, unspecified: Secondary | ICD-10-CM | POA: Insufficient documentation

## 2024-08-05 NOTE — Progress Notes (Unsigned)
° ° ° ° °  Subjective:    Patient ID: Joe Robertson, male    DOB: 1940/08/27, 83 y.o.   MRN: [id number]     HPI Joe Robertson is here for follow up of his chronic medical problems.    Medications and allergies reviewed with patient and updated if appropriate.  Medications Ordered Prior to Encounter[1]   Review of Systems     Objective:  There were no vitals filed for this visit. BP Readings from Last 3 Encounters:  02/05/24 120/70  02/04/24 123/78  01/16/24 138/70   Wt Readings from Last 3 Encounters:  04/28/24 202 lb (91.6 kg)  02/05/24 202 lb (91.6 kg)  02/04/24 203 lb (92.1 kg)   There is no height or weight on file to calculate BMI.    Physical Exam     Lab Results  Component Value Date   WBC 9.5 02/05/2024   HGB 15.8 02/05/2024   HCT 48.7 02/05/2024   PLT 216.0 02/05/2024   GLUCOSE 79 02/05/2024   CHOL 140 02/05/2024   TRIG 148.0 02/05/2024   HDL 48.80 02/05/2024   LDLDIRECT 147.0 02/02/2018   LDLCALC 61 02/05/2024   ALT 27 02/05/2024   AST 21 02/05/2024   NA 143 02/05/2024   K 4.1 02/05/2024   CL 107 02/05/2024   CREATININE 1.17 02/05/2024   BUN 14 02/05/2024   CO2 29 02/05/2024   TSH 2.02 08/07/2023   INR 1.0 10/30/2019   HGBA1C 5.8 02/05/2024     Assessment & Plan:    See Problem List for Assessment and Plan of chronic medical problems.       [1]  Current Outpatient Medications on File Prior to Visit  Medication Sig Dispense Refill   acetaminophen  (TYLENOL ) 650 MG CR tablet Take 650 mg by mouth daily as needed.     aspirin  EC 81 MG EC tablet Take 1 tablet (81 mg total) by mouth daily.     cyanocobalamin  (VITAMIN B12) 1000 MCG tablet Take 1,000 mcg by mouth 3 (three) times a week.     memantine  (NAMENDA ) 5 MG tablet Take 5 mg daily for one week then increase to 5 mg bid 60 tablet 5   rosuvastatin  (CRESTOR ) 10 MG tablet TAKE 1 TABLET(10 MG) BY MOUTH DAILY 90 tablet 3   traMADol  (ULTRAM ) 50 MG tablet Take 1-2 tablets (50-100 mg total) by  mouth every 8 (eight) hours as needed for severe pain (pain score 7-10). 40 tablet 0   No current facility-administered medications on file prior to visit.

## 2024-08-05 NOTE — Patient Instructions (Addendum)
° ° ° ° °  Blood work was ordered.       Medications changes include :   None    A referral was ordered and someone will call you to schedule an appointment.     Return in about 6 months (around 02/04/2025) for follow up.

## 2024-08-06 ENCOUNTER — Ambulatory Visit (INDEPENDENT_AMBULATORY_CARE_PROVIDER_SITE_OTHER): Admitting: Internal Medicine

## 2024-08-06 ENCOUNTER — Ambulatory Visit: Payer: Self-pay | Admitting: Internal Medicine

## 2024-08-06 VITALS — BP 120/78 | HR 90 | Temp 98.0°F | Resp 16 | Ht 70.0 in | Wt 206.0 lb

## 2024-08-06 DIAGNOSIS — E538 Deficiency of other specified B group vitamins: Secondary | ICD-10-CM

## 2024-08-06 DIAGNOSIS — E559 Vitamin D deficiency, unspecified: Secondary | ICD-10-CM

## 2024-08-06 DIAGNOSIS — R7303 Prediabetes: Secondary | ICD-10-CM

## 2024-08-06 DIAGNOSIS — F0280 Dementia in other diseases classified elsewhere without behavioral disturbance: Secondary | ICD-10-CM

## 2024-08-06 DIAGNOSIS — G309 Alzheimer's disease, unspecified: Secondary | ICD-10-CM

## 2024-08-06 DIAGNOSIS — Z8673 Personal history of transient ischemic attack (TIA), and cerebral infarction without residual deficits: Secondary | ICD-10-CM

## 2024-08-06 DIAGNOSIS — F0150 Vascular dementia without behavioral disturbance: Secondary | ICD-10-CM | POA: Diagnosis not present

## 2024-08-06 DIAGNOSIS — E7800 Pure hypercholesterolemia, unspecified: Secondary | ICD-10-CM | POA: Diagnosis not present

## 2024-08-06 DIAGNOSIS — R296 Repeated falls: Secondary | ICD-10-CM

## 2024-08-06 LAB — CBC WITH DIFFERENTIAL/PLATELET
Basophils Absolute: 0 K/uL (ref 0.0–0.1)
Basophils Relative: 0.3 % (ref 0.0–3.0)
Eosinophils Absolute: 0.3 K/uL (ref 0.0–0.7)
Eosinophils Relative: 3.9 % (ref 0.0–5.0)
HCT: 46.7 % (ref 39.0–52.0)
Hemoglobin: 15.3 g/dL (ref 13.0–17.0)
Lymphocytes Relative: 41.2 % (ref 12.0–46.0)
Lymphs Abs: 3.7 K/uL (ref 0.7–4.0)
MCHC: 32.7 g/dL (ref 30.0–36.0)
MCV: 93.3 fl (ref 78.0–100.0)
Monocytes Absolute: 1.1 K/uL — ABNORMAL HIGH (ref 0.1–1.0)
Monocytes Relative: 12.7 % — ABNORMAL HIGH (ref 3.0–12.0)
Neutro Abs: 3.7 K/uL (ref 1.4–7.7)
Neutrophils Relative %: 41.9 % — ABNORMAL LOW (ref 43.0–77.0)
Platelets: 195 K/uL (ref 150.0–400.0)
RBC: 5 Mil/uL (ref 4.22–5.81)
RDW: 15.1 % (ref 11.5–15.5)
WBC: 8.9 K/uL (ref 4.0–10.5)

## 2024-08-06 LAB — COMPREHENSIVE METABOLIC PANEL WITH GFR
ALT: 19 U/L (ref 3–53)
AST: 18 U/L (ref 5–37)
Albumin: 4.1 g/dL (ref 3.5–5.2)
Alkaline Phosphatase: 54 U/L (ref 39–117)
BUN: 13 mg/dL (ref 6–23)
CO2: 31 meq/L (ref 19–32)
Calcium: 9.1 mg/dL (ref 8.4–10.5)
Chloride: 104 meq/L (ref 96–112)
Creatinine, Ser: 1.08 mg/dL (ref 0.40–1.50)
GFR: 64.05 mL/min
Glucose, Bld: 88 mg/dL (ref 70–99)
Potassium: 3.8 meq/L (ref 3.5–5.1)
Sodium: 141 meq/L (ref 135–145)
Total Bilirubin: 0.9 mg/dL (ref 0.2–1.2)
Total Protein: 7 g/dL (ref 6.0–8.3)

## 2024-08-06 LAB — LIPID PANEL
Cholesterol: 129 mg/dL (ref 28–200)
HDL: 41.9 mg/dL
LDL Cholesterol: 67 mg/dL (ref 10–99)
NonHDL: 86.98
Total CHOL/HDL Ratio: 3
Triglycerides: 98 mg/dL (ref 10.0–149.0)
VLDL: 19.6 mg/dL (ref 0.0–40.0)

## 2024-08-06 LAB — HEMOGLOBIN A1C: Hgb A1c MFr Bld: 5.5 % (ref 4.6–6.5)

## 2024-08-06 LAB — VITAMIN B12: Vitamin B-12: 283 pg/mL (ref 211–911)

## 2024-08-06 LAB — VITAMIN D 25 HYDROXY (VIT D DEFICIENCY, FRACTURES): VITD: 40.93 ng/mL (ref 30.00–100.00)

## 2024-08-06 NOTE — Assessment & Plan Note (Signed)
 Chronic Following with neurology Continue Namenda  daily 5 mg twice daily June 2025 MMSE 15

## 2024-08-06 NOTE — Assessment & Plan Note (Signed)
 Chronic Lab Results  Component Value Date   HGBA1C 5.8 02/05/2024   Check a1c Low sugar / carb diet

## 2024-08-06 NOTE — Assessment & Plan Note (Addendum)
 Chronic He is taking B12 daily Check B12 level

## 2024-08-06 NOTE — Assessment & Plan Note (Addendum)
 Chronic History of stroke Lab Results  Component Value Date   LDLCALC 61 02/05/2024   Continue rosuvastatin  10 mg daily Check lipids, CMP

## 2024-08-06 NOTE — Assessment & Plan Note (Addendum)
 History of stroke-residual dizziness Lab Results  Component Value Date   LDLCALC 61 02/05/2024   Stressed importance of taking Crestor  daily Check lipids Continue aspirin  81 mg daily Blood pressure controlled Encouraged healthy diet
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
## 2024-08-06 NOTE — Assessment & Plan Note (Addendum)
 Chronic He is taking vitamin D  daily Check vitamin d  level

## 2024-08-13 ENCOUNTER — Other Ambulatory Visit: Payer: Self-pay | Admitting: Internal Medicine

## 2024-08-17 ENCOUNTER — Other Ambulatory Visit: Payer: Self-pay

## 2024-08-17 MED ORDER — MEMANTINE HCL 5 MG PO TABS: ORAL_TABLET | ORAL | 5 refills | Status: DC

## 2024-08-23 ENCOUNTER — Telehealth: Payer: Self-pay | Admitting: Sports Medicine

## 2024-08-23 NOTE — Telephone Encounter (Signed)
 Patient's wife called stating that the patient fell out of  bed Saturday night but is still having pain in his back, mostly in the middle.  They asked if he would be able to come in to get an xray? We do not have any appointments this week currently.   Please advise.

## 2024-08-24 NOTE — Telephone Encounter (Signed)
 Scheduled with Dr Claudene tomorrow and on wait list for Dr Leonce if something else opens up.

## 2024-08-24 NOTE — Telephone Encounter (Signed)
 Cancellation with Dr Leonce came up. Appointment moved

## 2024-08-25 ENCOUNTER — Ambulatory Visit: Admitting: Family Medicine

## 2024-08-25 ENCOUNTER — Ambulatory Visit

## 2024-08-25 ENCOUNTER — Ambulatory Visit: Admitting: Sports Medicine

## 2024-08-25 VITALS — Ht 70.0 in | Wt 206.0 lb

## 2024-08-25 DIAGNOSIS — W19XXXA Unspecified fall, initial encounter: Secondary | ICD-10-CM | POA: Diagnosis not present

## 2024-08-25 DIAGNOSIS — M546 Pain in thoracic spine: Secondary | ICD-10-CM | POA: Diagnosis not present

## 2024-08-25 DIAGNOSIS — R0789 Other chest pain: Secondary | ICD-10-CM

## 2024-08-25 DIAGNOSIS — G8929 Other chronic pain: Secondary | ICD-10-CM

## 2024-08-25 DIAGNOSIS — H539 Unspecified visual disturbance: Secondary | ICD-10-CM

## 2024-08-25 DIAGNOSIS — R4781 Slurred speech: Secondary | ICD-10-CM | POA: Diagnosis not present

## 2024-08-25 DIAGNOSIS — R270 Ataxia, unspecified: Secondary | ICD-10-CM | POA: Diagnosis not present

## 2024-08-25 NOTE — Patient Instructions (Addendum)
 Tylenol  [phone] mg 2-3 times a day for pain relief   Icy hot or similar over areas of pain   Lidocaine  patches if can handle   Brain MRI   No solid food until evaluated by neurology next week. Only soft food like mashed potatoes and soups.  If patient has severe pain or trouble breathing. You should call 911 and go to the ER   As needed follow up

## 2024-08-25 NOTE — Progress Notes (Signed)
 "               [name] [address] [place] [address] Phone: [phone]   Assessment and Plan:     1. Chronic bilateral thoracic back pain (Primary) 2. Rib pain 3. Fall, initial encounter -Chronic with exacerbation, subsequent visit - Recurrent upper back pain after new fall from bed 5 days ago.  Patient's pain appears to be primarily related to rib cage contusion with pain with deep inhalation, exhalation, and certain positioning - Thoracic x-ray obtained in clinic.  My interpretation: No acute fracture or vertebral collapse.  No pneumothorax -Encouraged to continue to use walker for support with ambulation - Use Tylenol  500 to 1000 mg tablets 2-3 times a day for day-to-day pain relief - May use topical lidocaine  patches, IcyHot over areas of pain  4. Slurred speech 5. Ataxia 6. Visual disturbance -Chronic with exacerbation, subsequent visit - Concern for rapidly progressing dementia versus new CVA - Patient has mildly slurred speech, increased difficulty walking, inability to visually track vertically with horizontal tracking intact, increased irritability at home per patient's wife, frequent clearing of throat - Patient appears hemodynamically stable on physical exam based on vital signs at today's visit.  I do not feel that he requires emergent evaluation, however I discussed individually with patient's wife that if patient's pain is not well-controlled, if he becomes short of breath or has difficulty breathing they should call 911 immediately. - Patient has follow-up visit with neurology next week.  Encouraged to keep this appointment - Will order brain MRI without contrast to evaluate for possible CVA    Pertinent previous records reviewed include none   Follow Up: As needed   Subjective:   I, [name], am serving as a neurosurgeon for Doctor Morene Mace  Chief Complaint: multiple musculoskeletal issues   HPI:    08/25/2024 Patient is a 84 year old male with multiple musculoskeletal issues. Patient states he fell getting into the bed Saturday night. Thoracic back pain. Tylenol  for the pain. Pain when he takes a deep breath.    Relevant Historical Information: Hypertension, dementia, multiple falls  Additional pertinent review of systems negative.  Current Medications[1]   Objective:     Vitals:   08/25/24 1448  Weight: 206 lb (93.4 kg)  Height: 5' 10 (1.778 m)      Body mass index is 29.56 kg/m.    Physical Exam:    General: Well-appearing, cooperative, sitting comfortably in no acute distress.  Pleasantly demented HEENT: Normocephalic, atraumatic.  Smooth pursuits delayed but intact horizontally.  Patient unable to perform smooth pursuits vertically Neck: No gross abnormality.  Cardiovascular: No pallor or cyanosis. Resp: Comfortable WOB with shallow breaths.  Rib cage discomfort without flail chest with deep  inspiration/expiration Skin: Warm and dry; no focal rashes identified on limited exam. Extremities: No cyanosis or edema.  MSK: No bruising or gross deformity of thoracic or lumbar spine.  NTTP thoracic and lumbar spinous processes, paraspinal musculature.  Using walker for support with ambulation   Electronically signed by:  [name] 3:42 PM 08/25/2024     [1]  Current Outpatient Medications:    acetaminophen  (TYLENOL ) 650 MG CR tablet, Take 650 mg by mouth daily as needed., Disp: , Rfl:    aspirin  EC 81 MG EC tablet, Take 1 tablet (81 mg total) by mouth daily., Disp:  , Rfl:    cyanocobalamin  (VITAMIN B12) 1000 MCG  tablet, Take 1,000 mcg by mouth 3 (three) times a week., Disp: , Rfl:    memantine  (NAMENDA ) 5 MG tablet, TAKE 1 TABLET BY MOUTH DAILY FOR 1 WEEK, THEN INCREASE TO 1 TABLET TWICE DAILY, Disp: 60 tablet, Rfl: 5   rosuvastatin  (CRESTOR ) 10 MG tablet, TAKE 1 TABLET(10 MG) BY MOUTH DAILY, Disp: 90 tablet, Rfl: 3  "

## 2024-08-31 NOTE — Progress Notes (Unsigned)
 " [facility] [address] [place]. [place] [address] [phone]       STROKE FOLLOW UP NOTE  Joe Robertson Date of Birth:  1940/12/31 Medical Record Number:  [id number]   Reason for Referral: stroke follow up    SUBJECTIVE:   CHIEF COMPLAINT:  No chief complaint on file.    HPI:   Update 09/01/2024 JM: Patient returns for follow-up visit.    Complaints of slurred speech, ataxia and visual disturbance.  Dr. Leonce at sports medicine ordered MRI brain to rule out possible stroke and is scheduled 1/23.  Patient has mildly slurred speech, increased difficulty walking, inability to visually track vertically with horizontal tracking intact, increased irritability at home per patient's wife, frequent clearing of throat       History provided for reference purposes only Update 02/04/2024 JM: Patient returns for follow-up visit after PCP referred back to office for cognitive decline (advised 6 month f/u visit with Dr. Rosemarie almost 2 years ago but lost to follow up).  After prior visit, dementia panel completed which was overall satisfactory except B12 low to normal range at 239.  Repeat level with PCP 08/2022 at 168 and started on daily supplement.  Repeat MRI brain 05/2022 showed evidence of chronic right thalamic stroke, mild generalized cortical atrophy and mild chronic microvascular stomach changes and 3 small chronic microhemorrhages in the right parietal and temporal lobes.  He is accompanied by his wife who provides majority of history.  She reports continued cognitive decline primarily with short-term memory, he repeats himself frequently, has had increased falls and gets agitated easily. MMSE today 15/30, previously 19/30 in 05/2022.  She reports gradual decline over the past year.  Agitation has been gradually worsening over the past several months, usually worse in the evening.  He can be confused and will get agitated if he is told he cannot do  something such as bring the car to get an oil change at 9 PM, he does not normally drive. He denies any feeling of depression or anxiety.  Reports he sleeps well and appetite good.  Denies any visual hallucinations.  Reports mother had history of Alzheimer's disease, he has 5 siblings without any memory concerns they are aware of.  He continues on B12 supplement every other day.  He enjoys doing Mahjong puzzles.   He has been having recurrent falls.  He was seen in the ED recently 12/2023 after a fall sitting in a chair that tipped backwards resulting in a head laceration requiring staples for closure and C1 fracture.  He is supposed to be wearing a cervical collar but is noncompliant as it is uncomfortable.  They have follow-up with neurosurgery next week.  Wife reports 2 additional falls since that time but thankfully no significant injury.  He is supposed to be using a rollator walker which he has been better about using routinely but 2 recent falls he was not using.  Recently completed home health PT, was initially prescribed for shoulder pain but was also working on gait/balance.  Declines interest in any additional sessions or outpatient therapy.  No new stroke/TIA symptoms.  Reports compliance on aspirin  and Crestor .  Routinely follows with PCP Dr. Geofm for stroke risk factor management.  Update 05/21/2022 Dr. Rosemarie: He returns for follow-up after last visit with [name] 5 months ago.  He is accompanied by his wife.  He continues to have dizzy spells which did not there is a constant good days and bad  days.  He has learned to use a cane most of the time.  He still has a few falls and fell 4 weeks ago and fractured his collarbone.  He denies any vertigo meds.  Patient feels off balance.  Patient was referred at last visit.  He was referred to therapy for vestibular rehab but he did not have a good experience with his therapist decided not to continue as he states he was falsely accused  of  inappropriate behavior.  He was advised to undergo MRI scan and CT angiograms which he has not done.  Patient has a new complaint of decreased short-term memory and cognitive difficulties.  He has not had this evaluated yet.  EEG on 01/03/2022 was normal.  Denies headache or new stroke or TIA-like symptoms.  He remains on aspirin  for tolerating well without bruising or bleeding.  States his blood pressure is under good control at home but today it is elevated at 151/50.  He is tolerating Crestor  well without muscle aches and pains.  Lipid profile on 03/04/2022 showed LDL cholesterol to be 95 and hemoglobin A1c today 5.8.  No family history of Alzheimer's or dementia.  He denies any tinnitus or decreased hearing.   Update 12/25/2021 JM: Patient returns for sooner follow-up visit due to ongoing concerns of dizziness.  He is accompanied by his wife.  Previously seen 08/29/2021 reporting intermittent dizziness with presyncope/syncopal events.  Encouraged cardiology evaluation which was completed 1/16 with Dr. Alvan with completion of 7-day Zio patch which was unremarkable.  No further evaluation or follow-up recommended.  Reports dizziness has been persistent since his stroke in 10/2019 although did not first report this until 03/2020 and had a couple episodes of dizziness prior to hospitalization.  Dizziness has been progressively worsening since prior visit 4 months ago.  Of note, he c/o difficulty sleeping therefore started on trazodone  in January with recent increase to 100 mg nightly. Reports took previously (shortly after stroke) without side effects.   Difficulty getting full details regarding dizziness due to baseline cognitive impairment but overall seems to only occur with position changes either first thing in the morning or occasionally during the day. Can last 5 to 6 minutes although he has not actually timed. At times can be severe where he has difficulty ambulating. He has not had any additional  presyncope/syncopal events since 06/2021.  During dizziness episodes, denies headaches, confusion, visual changes, LOC, weakness/numbness, speech changes or altered mental status. He is aware of events, able to communicate with his wife. Denies onset with quick head or eye movements, after prolonged standing or during increased stressful situations.  Does have baseline hearing loss with use of hearing aids but denies any worsening. He has been previously treated by ENT Dr. Ethyl for otitis (08/2019). Believes this dizziness has greatly impacted his quality of life. Wife notes generalized weakness and fatigue since his stroke and just not the same. Does ambulate slower with use of cane for long distance, no recent falls. Denies LE numbness/tingling or pains. Denies low back or neck pain. Tries to be active around him home but no formal exercise regimen. Admits to limited to no water intake, will drink hot tea in the morning and occasionally drink a Gatorade.  Orthostatic vitals negative - BP laying 140/85 HR 81, standing 1 min BP 140/87 HR 85 and standing 3 min BP 141/85, HR 85.  Occasionally monitors blood pressure at home, has not yet checked blood pressure during dizziness episode as previously discussed.  Otherwise, stable from stroke standpoint.  Denies new stroke/TIA symptoms.  Compliant on aspirin  and Crestor , denies side effects.  Closely followed by PCP Dr. Geofm.  No further concerns at this time.  Update 08/29/2021 JM: returns for 6 mo stroke f/u accompanied by his wife.  Overall stable.  Denies new stroke/TIA symptoms. C/o continued intermittent dizziness - did have a fall in November - states he started to feel very dizzy and then blacked out and lose consciousness that he believes only lasted for a few seconds. Once came to, denies confusion or any type of seizure activity. Dizziness typically upon getting up in the morning and randomly during the day. Reports present since stroke but first  reported this at visit in 03/2020 with similar presyncope/syncopal episode. Recommend eval by cardiology but never pursued this - no additional syncopal events until recently. Does not check blood pressure with dizziness although checks daily and stable. Admits to limited water intake. Denies tinnitus or hearing loss. Denies dizziness/vertigo symptoms with quick head position changes. Dizziness is not debilitating - able to ambulate with use of cane. Cognition stable since prior visit.  Compliant on aspirin  and Crestor  -denies side effects.  Blood pressure today 156/88. Has f/u with PCP scheduled this Friday. No further concerns at this time.   Update 02/21/2021 JM: Mr. Kendra returns for 72-month stroke follow-up accompanied by his wife, Joe Robertson.  Stable since prior visit without new stroke/TIA symptoms.  He does report continued intermittent dizziness which has been present since his stroke as well as short-term memory loss which can fluctuate but denies specific worsening.  He continues to ambulate with a cane and denies any recent falls.  He stays active during the day and routinely does memory exercises.  He reports he sleeps well at night and has a good appetite. Reports compliance on aspirin  and Crestor  5 mg three times weekly without associated side effects.  LDL 53 down from 124 on 10/11/2020.  Blood pressure today 139/70.  No further concerns at this time.  Update 08/23/2020 JM: Mr. Wachsmuth returns for 17-month stroke follow-up accompanied by his wife.  He has been doing very well since prior visit with resolution of prior complaints including dizziness, insomnia and cognitive concerns.  He has unfortunately had a couple falls the first week of December.  Both of them in setting of slipping on a slick surface and unfortunately resulted in left hand fracture but otherwise no other injuries.  Denies falls occurring due to dizziness or lack of balance.  Denies new stroke/TIA symptoms.  Remains on aspirin  and  pravastatin  40 mg daily for secondary stroke prevention without side effects.  Lipid panel obtained by PCP 08/21/2020 with LDL 124 therefore Dr. Geofm plans on trialing Crestor  as he has had difficulty tolerating high-dose statins.  Blood pressure today 134/74. Monitors at home which has been stable and similar to todays reading.  No concerns at this time.  Update 04/18/2020 JM: Mr. Savas returns for stroke follow-up accompanied by his wife Complains of post stroke cognitive impairment with short-term memory loss  He also complains of difficulty sleeping at night as well as daytime fatigue and morning grogginess.  He also feels at times he has difficulty laying flat or will wake up gasping for air He has been experiencing dizziness since his stroke - unable to determine if he is experiencing a true dizziness sensation vs vertigo vs imbalance as he will start speaking of a different topic He does report 3 episodes of vision going  black (aware of his surroundings), foggy headedness and dizziness resulting in a fall.  He did lose consciousness with 2 of those episodes.  Prior episode occurred on 6/16.  Denies dyspnea, heart palpitation or racing heart type sensation. Apparently, he has spoke to his PCP regarding above concerns but she feels as though more anxiety related He is concerned as there may be something else going on or possibly medication related He has been experiencing greater right hip pain recently receiving injection by orthopedics with benefit Currently using a cane for ambulation Remains on aspirin  and pravastatin  40 mg daily for secondary stroke prevention without side effects Blood pressure today 142/76 Follows closely with PCP for HTN and HLD management No further concerns at this time  Update 12/02/2019 JM:, Mr. Marrow is being seen for hospital follow-up. He has been doing well from a stroke standpoint with residual mild cognitive impairment but denies weakness or  numbness/tingling. 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 He has completed 3 weeks DAPT and continues on aspirin  alone without bleeding or bruising.  Continues on pravastatin  40 mg daily tolerating well without side effects.  Blood pressure today  127/73 with similar home readings. PCP recently initiated losartan  50 mg daily for elevated BP and new diagnosis of HTN.  Also reported difficulty sleeping with increased anxiety and PCP initiated trazodone  50 mg nightly and recently increased to 100 mg nightly.  No further concerns at this time.  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  Evaluated by stroke team and Dr. 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  Recommended DAPT for 3 weeks and aspirin  alone.  LDL 151 and history of statin intolerance with Zocor and Lipitor and patient agreeable to trial of pravastatin  40 mg daily.  No history of HTN or DM.  No prior history of stroke.  Evaluated by therapies and recommended home health OT and SLP and was discharged home in stable condition on 11/02/2019.  Stroke:  R thalamic infarct s/p IV tPA, secondary to small vessel disease Code Stroke CT Head - No acute intracranial abnormality.  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  MRI head - R thalamic infarct  2D Echo - EF 60-65%. No source of embolus  [employer] Virus 2 - negative LDL - 151 -initiate pravastatin  40 mg daily -history of statin intolerance to simvastatin and atorvastatin HgbA1c - 5.4 No antithrombotic prior to admission, now on aspirin  81 mg and plavix  75 mg daily x 3 weeks then aspirin  alone.   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   Left Upper Arm - Anterior (in situ)   Stroke (HCC) 10/2019    PSH:  Past Surgical History:  Procedure Laterality Date   CHOLECYSTECTOMY     INGUINAL HERNIA REPAIR Right 01/24/2017   Procedure: LAPAROSCOPIC RIGHT INGUINAL HERNIA WITH MESH;  Surgeon: Rubin Calamity, MD;  Location: [facility] OR;  Service: General;  Laterality: Right;   INSERTION OF MESH Right 01/24/2017   Procedure: INSERTION OF MESH;  Surgeon: Rubin Calamity, MD;  Location: [facility] OR;  Service: General;  Laterality: Right;    Social History:  Social History   Socioeconomic History   Marital status: Married    Spouse name: Cy   Number of children: Not on file   Years of education: Not on file   Highest education level: Doctorate  Occupational History    Comment: retired adult nurse  Tobacco Use   Smoking status: Former   Smokeless tobacco: Never   Tobacco comments:    as teenager  Substance and Sexual Activity   Alcohol use: No   Drug use: No   Sexual activity: Not Currently  Other Topics Concern   Not on file  Social History Narrative   Married   Social Drivers of Health   Tobacco Use: Medium Risk (08/05/2024)   Patient History    Smoking Tobacco Use: Former    Smokeless Tobacco Use: Never    Passive Exposure: Not on Actuary Strain: Low Risk (04/28/2024)   Overall  Financial Resource Strain (CARDIA)    Difficulty of Paying Living Expenses: Not hard at all  Food Insecurity: No Food Insecurity (04/28/2024)   Epic    Worried About Radiation Protection Practitioner of Food in the Last Year: Never true    Ran Out of Food in the Last Year: Never true  Transportation Needs: No Transportation Needs (04/28/2024)   Epic    Lack of Transportation (Medical): No    Lack of Transportation (Non-Medical): No  Physical Activity: Insufficiently Active (04/28/2024)   Exercise Vital Sign    Days of Exercise per Week: 7 days    Minutes of Exercise per Session: 20 min  Stress: No Stress Concern Present (04/28/2024)   [employer] of Occupational Health - Occupational Stress Questionnaire    Feeling of Stress: Not at all  Social Connections: Socially Integrated (04/28/2024)   Social Connection and Isolation Panel    Frequency of Communication with Friends and Family: More than three times a week    Frequency of Social Gatherings with Friends and Family: Once a week    Attends Religious Services: More than 4 times per year    Active Member of Clubs or Organizations: No    Attends Engineer, Structural: More than 4 times per year    Marital Status: Married  Catering Manager Violence: Not At Risk (04/28/2024)   Epic    Fear of Current or Ex-Partner: No    Emotionally Abused: No    Physically Abused: No    Sexually Abused: No  Depression (PHQ2-9): Low Risk (04/28/2024)   Depression (PHQ2-9)    PHQ-2 Score: 0  Alcohol Screen: Low Risk (04/28/2024)   Alcohol Screen    Last Alcohol Screening Score (AUDIT): 0  Housing: Unknown (04/28/2024)   Epic    Unable to Pay for Housing in the Last Year: No    Number of Times Moved in the Last Year: Not on file    Homeless in  the Last Year: No  Utilities: Not At Risk (04/28/2024)   Epic    Threatened with loss of utilities: No  Health Literacy: Inadequate Health Literacy (04/28/2024)   B1300 Health Literacy    Frequency of need for help with  medical instructions: Always    Family History:  Family History  Problem Relation Age of Onset   Arthritis Mother    Heart disease Father    Arthritis Father    Diabetes Maternal Aunt    Cancer Paternal Uncle        lung   Stroke Maternal Grandmother    Diabetes Maternal Grandmother     Medications:   Current Outpatient Medications on File Prior to Visit  Medication Sig Dispense Refill   acetaminophen  (TYLENOL ) 650 MG CR tablet Take 650 mg by mouth daily as needed.     aspirin  EC 81 MG EC tablet Take 1 tablet (81 mg total) by mouth daily.     cyanocobalamin  (VITAMIN B12) 1000 MCG tablet Take 1,000 mcg by mouth 3 (three) times a week.     memantine  (NAMENDA ) 5 MG tablet TAKE 1 TABLET BY MOUTH DAILY FOR 1 WEEK, THEN INCREASE TO 1 TABLET TWICE DAILY 60 tablet 5   rosuvastatin  (CRESTOR ) 10 MG tablet TAKE 1 TABLET(10 MG) BY MOUTH DAILY 90 tablet 3   No current facility-administered medications on file prior to visit.    Allergies:   Allergies  Allergen Reactions   Simvastatin Other (See Comments)    Muscle pain   Lipitor [Atorvastatin] Other (See Comments)    Muscle aches      OBJECTIVE:  Vitals  There were no vitals filed for this visit.  There is no height or weight on file to calculate BMI. No results found.  Physical exam General: well developed, well nourished, very pleasant elderly Caucasian male, seated, in no evident distress  Neurologic Exam Mental Status: Awake and fully alert.  Fluent speech and language.  Disoriented to place and time. Recent memory impaired and remote memory appears intact. Attention span and concentration impaired with frequently speaking off topic or not answering questions appropriately and wife provides majority of history. Mood and affect appropriate. Cranial Nerves: Pupils equal, briskly reactive to light. Extraocular movements full without nystagmus. Visual fields full to confrontation.  HOH bilaterally with use of hearing aids.  Facial sensation intact. Face, tongue, palate moves normally and symmetrically.  Motor: Normal bulk and tone. Normal strength in all tested extremity muscles.   Coordination: Rapid alternating movements normal in all extremities. Finger-to-nose and heel-to-shin performed accurately bilaterally. Gait and Station: Arises from chair with mild difficulty. Stance is slightly hunched.  Gait demonstrates decreased stride length and step height bilaterally, holds on to wife for support as RW not present.  Reflexes: 1+ and symmetric. Toes downgoing.      02/04/2024    8:44 AM 05/21/2022   11:54 AM  MMSE - Mini Mental State Exam  Orientation to time 1 2  Orientation to Place 3 4  Registration 3 3  Attention/ Calculation 1 2  Recall 0 0  Language- name 2 objects 2 2  Language- repeat 0 0  Language- follow 3 step command 3 3  Language- read & follow direction 1 1  Write a sentence 1 1  Copy design 0 1  Copy design-comments  4 animals, failed clock drawing  Total score 15 19       ASSESSMENT: Joe Robertson is a 84 y.o. year old male who is being  seen for cognitive decline.  Prior history of right thalamic stroke in 2021 with resultant cognitive impairment.    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 Vitamin B12 deficiency -most recent B12 07/2023 358 (prior 168 08/2022) -encouraged continued supplement Discussed continued routine cognitive exercises as well as ensuring good sleep, healthy diet and management of vascular risk factors   Gait impairment: Frequent falls: Discussed importance of using rollator walker at all times for fall prevention Continue exercises as  advised by recent home health PT Advised to call if he wishes to pursue additional therapies   Hx of Right thalamic stroke:  Continue aspirin  81 mg daily  and Crestor  for secondary stroke prevention managed/prescribed by PCP Discussed secondary stroke prevention measures and importance of close PCP follow-up for aggressive stroke risk factor management including BP goal<130/90HLD with LDL goal<70     Follow-up in 6 months or call earlier if needed     I personally spent a total of 45 minutes in the care of the patient today including preparing to see the patient, performing a medically appropriate exam/evaluation, counseling and educating, and documenting clinical information in the EHR.  Harlene Bogaert, AGNP-BC  [facility] [address] [place], [place] [address]  Phone [phone] Fax [phone] Note: This document was prepared with digital dictation and possible smart phrase technology. Any transcriptional errors that result from this process are unintentional.    "

## 2024-09-01 ENCOUNTER — Ambulatory Visit: Admitting: Adult Health

## 2024-09-01 ENCOUNTER — Encounter: Payer: Self-pay | Admitting: Adult Health

## 2024-09-01 VITALS — BP 130/88 | HR 98 | Ht 70.0 in | Wt 200.4 lb

## 2024-09-01 DIAGNOSIS — R2689 Other abnormalities of gait and mobility: Secondary | ICD-10-CM | POA: Diagnosis not present

## 2024-09-01 DIAGNOSIS — H519 Unspecified disorder of binocular movement: Secondary | ICD-10-CM

## 2024-09-01 DIAGNOSIS — F01518 Vascular dementia, unspecified severity, with other behavioral disturbance: Secondary | ICD-10-CM | POA: Diagnosis not present

## 2024-09-01 DIAGNOSIS — F02818 Dementia in other diseases classified elsewhere, unspecified severity, with other behavioral disturbance: Secondary | ICD-10-CM | POA: Diagnosis not present

## 2024-09-01 DIAGNOSIS — G309 Alzheimer's disease, unspecified: Secondary | ICD-10-CM | POA: Diagnosis not present

## 2024-09-01 DIAGNOSIS — Z8673 Personal history of transient ischemic attack (TIA), and cerebral infarction without residual deficits: Secondary | ICD-10-CM

## 2024-09-01 MED ORDER — MEMANTINE HCL 10 MG PO TABS: 10.0000 mg | ORAL_TABLET | Freq: Two times a day (BID) | ORAL | 11 refills | Status: AC

## 2024-09-01 NOTE — Patient Instructions (Addendum)
 Your Plan:   Increase Namenda  gradually to 10mg  twice daily (increase to 5mg  AM and 10mg  PM for 1 week then increase to 10mg  twice daily)  Continue B12 supplement daily for B12 deficiency   If daytime fatigue worsens after increasing Namenda  dose, please let me know and we can try an extended release form   Pursue MRI brain as scheduled on 1/23 Based on these results, may be beneficial to be seen again by your eye doctor for repeat exam      Follow up in 6 months or call earlier if needed      Thank you for coming to see us  at [facility]. I hope we have been able to provide you high quality care today.  You may receive a patient satisfaction survey over the next few weeks. We would appreciate your feedback and comments so that we may continue to improve ourselves and the health of our patients.

## 2024-09-02 ENCOUNTER — Ambulatory Visit: Payer: Self-pay | Admitting: Sports Medicine

## 2024-09-07 ENCOUNTER — Telehealth: Payer: Self-pay | Admitting: Sports Medicine

## 2024-09-07 NOTE — Telephone Encounter (Signed)
 error

## 2024-09-09 ENCOUNTER — Other Ambulatory Visit (HOSPITAL_COMMUNITY): Payer: Self-pay

## 2024-09-10 ENCOUNTER — Ambulatory Visit
Admission: RE | Admit: 2024-09-10 | Discharge: 2024-09-10 | Disposition: A | Source: Ambulatory Visit | Attending: Sports Medicine

## 2024-09-10 DIAGNOSIS — R4781 Slurred speech: Secondary | ICD-10-CM

## 2024-09-10 DIAGNOSIS — R0789 Other chest pain: Secondary | ICD-10-CM

## 2024-09-10 DIAGNOSIS — G8929 Other chronic pain: Secondary | ICD-10-CM

## 2024-09-10 DIAGNOSIS — R270 Ataxia, unspecified: Secondary | ICD-10-CM

## 2024-09-10 DIAGNOSIS — H539 Unspecified visual disturbance: Secondary | ICD-10-CM

## 2024-09-14 ENCOUNTER — Encounter: Payer: Self-pay | Admitting: Adult Health

## 2025-02-07 ENCOUNTER — Ambulatory Visit: Admitting: Internal Medicine

## 2025-04-05 ENCOUNTER — Ambulatory Visit: Admitting: Adult Health

## 2025-05-03 ENCOUNTER — Ambulatory Visit

## 2025-05-05 ENCOUNTER — Ambulatory Visit
